# Patient Record
Sex: Female | Born: 1955 | Race: Black or African American | Hispanic: No | Smoking: Current some day smoker
Health system: Southern US, Community
[De-identification: ages and names within clinical notes are randomized; demographics above are authoritative.]

## PROBLEM LIST (undated history)

## (undated) ENCOUNTER — Emergency Department (HOSPITAL_COMMUNITY): Admission: EM | Payer: 59

## (undated) DIAGNOSIS — M549 Dorsalgia, unspecified: Secondary | ICD-10-CM

## (undated) DIAGNOSIS — M542 Cervicalgia: Secondary | ICD-10-CM

## (undated) DIAGNOSIS — I493 Ventricular premature depolarization: Secondary | ICD-10-CM

## (undated) DIAGNOSIS — T8859XA Other complications of anesthesia, initial encounter: Secondary | ICD-10-CM

## (undated) DIAGNOSIS — N189 Chronic kidney disease, unspecified: Secondary | ICD-10-CM

## (undated) DIAGNOSIS — F32A Depression, unspecified: Secondary | ICD-10-CM

## (undated) DIAGNOSIS — R112 Nausea with vomiting, unspecified: Secondary | ICD-10-CM

## (undated) DIAGNOSIS — I6529 Occlusion and stenosis of unspecified carotid artery: Secondary | ICD-10-CM

## (undated) DIAGNOSIS — I10 Essential (primary) hypertension: Secondary | ICD-10-CM

## (undated) DIAGNOSIS — M1990 Unspecified osteoarthritis, unspecified site: Secondary | ICD-10-CM

## (undated) DIAGNOSIS — K219 Gastro-esophageal reflux disease without esophagitis: Secondary | ICD-10-CM

## (undated) DIAGNOSIS — N644 Mastodynia: Secondary | ICD-10-CM

## (undated) DIAGNOSIS — E785 Hyperlipidemia, unspecified: Secondary | ICD-10-CM

## (undated) DIAGNOSIS — Z9889 Other specified postprocedural states: Secondary | ICD-10-CM

## (undated) DIAGNOSIS — M329 Systemic lupus erythematosus, unspecified: Secondary | ICD-10-CM

## (undated) HISTORY — PX: CERVICAL SPINE SURGERY: SHX589

## (undated) HISTORY — DX: Cervicalgia: M54.2

## (undated) HISTORY — DX: Essential (primary) hypertension: I10

## (undated) HISTORY — PX: APPENDECTOMY: SHX54

## (undated) HISTORY — DX: Mastodynia: N64.4

## (undated) HISTORY — DX: Ventricular premature depolarization: I49.3

## (undated) HISTORY — DX: Hyperlipidemia, unspecified: E78.5

## (undated) HISTORY — PX: ABDOMINAL HYSTERECTOMY: SHX81

## (undated) HISTORY — DX: Systemic lupus erythematosus, unspecified: M32.9

## (undated) HISTORY — DX: Dorsalgia, unspecified: M54.9

## (undated) HISTORY — DX: Chronic kidney disease, unspecified: N18.9

## (undated) HISTORY — PX: OTHER SURGICAL HISTORY: SHX169

## (undated) HISTORY — DX: Occlusion and stenosis of unspecified carotid artery: I65.29

---

## 2016-08-01 ENCOUNTER — Observation Stay (HOSPITAL_COMMUNITY): Payer: Medicare HMO

## 2016-08-01 ENCOUNTER — Encounter (HOSPITAL_COMMUNITY): Payer: Self-pay | Admitting: Family Medicine

## 2016-08-01 ENCOUNTER — Observation Stay (HOSPITAL_COMMUNITY)
Admission: AD | Admit: 2016-08-01 | Discharge: 2016-08-02 | Disposition: A | Discharge status: Home / Self Care | Payer: Medicare HMO | Source: Other Acute Inpatient Hospital | Attending: Internal Medicine | Admitting: Internal Medicine

## 2016-08-01 DIAGNOSIS — R079 Chest pain, unspecified: Secondary | ICD-10-CM | POA: Diagnosis present

## 2016-08-01 DIAGNOSIS — I10 Essential (primary) hypertension: Secondary | ICD-10-CM | POA: Diagnosis not present

## 2016-08-01 DIAGNOSIS — E049 Nontoxic goiter, unspecified: Secondary | ICD-10-CM | POA: Diagnosis not present

## 2016-08-01 DIAGNOSIS — R0789 Other chest pain: Secondary | ICD-10-CM | POA: Diagnosis not present

## 2016-08-01 DIAGNOSIS — R0689 Other abnormalities of breathing: Secondary | ICD-10-CM | POA: Insufficient documentation

## 2016-08-01 DIAGNOSIS — N289 Disorder of kidney and ureter, unspecified: Secondary | ICD-10-CM | POA: Diagnosis not present

## 2016-08-01 DIAGNOSIS — M549 Dorsalgia, unspecified: Secondary | ICD-10-CM | POA: Diagnosis not present

## 2016-08-01 DIAGNOSIS — F418 Other specified anxiety disorders: Secondary | ICD-10-CM | POA: Diagnosis present

## 2016-08-01 DIAGNOSIS — G8929 Other chronic pain: Secondary | ICD-10-CM | POA: Diagnosis not present

## 2016-08-01 DIAGNOSIS — R748 Abnormal levels of other serum enzymes: Secondary | ICD-10-CM | POA: Diagnosis present

## 2016-08-01 DIAGNOSIS — N184 Chronic kidney disease, stage 4 (severe): Secondary | ICD-10-CM

## 2016-08-01 DIAGNOSIS — Z7982 Long term (current) use of aspirin: Secondary | ICD-10-CM | POA: Insufficient documentation

## 2016-08-01 LAB — BASIC METABOLIC PANEL
ANION GAP: 9 (ref 5–15)
BUN: 24 mg/dL — AB (ref 6–20)
CO2: 23 mmol/L (ref 22–32)
Calcium: 9 mg/dL (ref 8.9–10.3)
Chloride: 112 mmol/L — ABNORMAL HIGH (ref 101–111)
Creatinine, Ser: 1.46 mg/dL — ABNORMAL HIGH (ref 0.44–1.00)
GFR calc Af Amer: 45 mL/min — ABNORMAL LOW (ref >60)
GFR, EST NON AFRICAN AMERICAN: 39 mL/min — AB (ref >60)
Glucose, Bld: 82 mg/dL (ref 65–99)
Potassium: 4 mmol/L (ref 3.5–5.1)
SODIUM: 144 mmol/L (ref 135–145)

## 2016-08-01 LAB — CBC WITH DIFFERENTIAL/PLATELET
BASOS ABS: 0.1 10*3/uL (ref 0.0–0.1)
Basophils Relative: 1 %
EOS ABS: 0.2 10*3/uL (ref 0.0–0.7)
Eosinophils Relative: 2 %
HEMATOCRIT: 36.7 % (ref 36.0–46.0)
Hemoglobin: 11.8 g/dL — ABNORMAL LOW (ref 12.0–15.0)
LYMPHS ABS: 5.3 10*3/uL — AB (ref 0.7–4.0)
LYMPHS PCT: 43 %
MCH: 21.9 pg — ABNORMAL LOW (ref 26.0–34.0)
MCHC: 32.2 g/dL (ref 30.0–36.0)
MCV: 68.1 fL — ABNORMAL LOW (ref 78.0–100.0)
MONOS PCT: 8 %
Monocytes Absolute: 1 10*3/uL (ref 0.1–1.0)
Neutro Abs: 5.7 10*3/uL (ref 1.7–7.7)
Neutrophils Relative %: 46 %
Platelets: 326 10*3/uL (ref 150–400)
RBC: 5.39 MIL/uL — ABNORMAL HIGH (ref 3.87–5.11)
RDW: 16 % — AB (ref 11.5–15.5)
WBC: 12.3 10*3/uL — AB (ref 4.0–10.5)

## 2016-08-01 LAB — TROPONIN I

## 2016-08-01 MED ORDER — BUPROPION HCL ER (SR) 100 MG PO TB12
100.0000 mg | ORAL_TABLET | ORAL | Status: DC
  Administered 2016-08-02: 100 mg via ORAL
  Filled 2016-08-01: qty 1

## 2016-08-01 MED ORDER — METOPROLOL TARTRATE 12.5 MG HALF TABLET
12.5000 mg | ORAL_TABLET | Freq: Two times a day (BID) | ORAL | Status: DC
  Administered 2016-08-02 (×2): 12.5 mg via ORAL
  Filled 2016-08-01 (×2): qty 1

## 2016-08-01 MED ORDER — ATORVASTATIN CALCIUM 40 MG PO TABS
40.0000 mg | ORAL_TABLET | Freq: Every day | ORAL | Status: DC
  Administered 2016-08-02: 40 mg via ORAL
  Filled 2016-08-01: qty 1

## 2016-08-01 MED ORDER — BREXPIPRAZOLE 1 MG PO TABS
1.0000 | ORAL_TABLET | Freq: Every day | ORAL | Status: DC
  Administered 2016-08-02: 1 mg via ORAL
  Filled 2016-08-01: qty 1

## 2016-08-01 MED ORDER — HEPARIN SODIUM (PORCINE) 5000 UNIT/ML IJ SOLN
5000.0000 [IU] | Freq: Three times a day (TID) | INTRAMUSCULAR | Status: DC
  Administered 2016-08-02 (×3): 5000 [IU] via SUBCUTANEOUS
  Filled 2016-08-01 (×3): qty 1

## 2016-08-01 MED ORDER — CYCLOBENZAPRINE HCL 10 MG PO TABS
10.0000 mg | ORAL_TABLET | Freq: Two times a day (BID) | ORAL | Status: DC
  Administered 2016-08-02 (×2): 10 mg via ORAL
  Filled 2016-08-01 (×2): qty 1

## 2016-08-01 MED ORDER — ONDANSETRON HCL 4 MG/2ML IJ SOLN: 4.0000 mg | Freq: Four times a day (QID) | INTRAMUSCULAR | Status: DC | PRN

## 2016-08-01 MED ORDER — NITROGLYCERIN 0.4 MG SL SUBL: 0.4000 mg | SUBLINGUAL_TABLET | SUBLINGUAL | Status: DC | PRN

## 2016-08-01 MED ORDER — SODIUM CHLORIDE 0.9 % IV SOLN
INTRAVENOUS | Status: DC
  Administered 2016-08-02: 01:00:00 via INTRAVENOUS

## 2016-08-01 MED ORDER — BUSPIRONE HCL 5 MG PO TABS
15.0000 mg | ORAL_TABLET | Freq: Three times a day (TID) | ORAL | Status: DC
  Administered 2016-08-02 (×2): 15 mg via ORAL
  Filled 2016-08-01 (×2): qty 1

## 2016-08-01 MED ORDER — ASPIRIN EC 81 MG PO TBEC
81.0000 mg | DELAYED_RELEASE_TABLET | Freq: Every day | ORAL | Status: DC
  Administered 2016-08-02: 81 mg via ORAL
  Filled 2016-08-01: qty 1

## 2016-08-01 MED ORDER — ACETAMINOPHEN 325 MG PO TABS: 650.0000 mg | ORAL_TABLET | ORAL | Status: DC | PRN

## 2016-08-01 MED ORDER — ADULT MULTIVITAMIN W/MINERALS CH
1.0000 | ORAL_TABLET | Freq: Every day | ORAL | Status: DC
  Administered 2016-08-02: 1 via ORAL
  Filled 2016-08-01: qty 1

## 2016-08-01 MED ORDER — ZOLPIDEM TARTRATE 5 MG PO TABS: 5.0000 mg | ORAL_TABLET | Freq: Every evening | ORAL | Status: DC | PRN

## 2016-08-01 MED ORDER — HYDROCODONE-ACETAMINOPHEN 10-325 MG PO TABS
1.0000 | ORAL_TABLET | Freq: Four times a day (QID) | ORAL | Status: DC | PRN
  Administered 2016-08-02 (×2): 1 via ORAL
  Filled 2016-08-01 (×2): qty 1

## 2016-08-01 MED ORDER — CITALOPRAM HYDROBROMIDE 20 MG PO TABS
40.0000 mg | ORAL_TABLET | Freq: Every day | ORAL | Status: DC
  Administered 2016-08-02: 40 mg via ORAL
  Filled 2016-08-01: qty 2

## 2016-08-01 MED ORDER — GABAPENTIN 300 MG PO CAPS
600.0000 mg | ORAL_CAPSULE | Freq: Three times a day (TID) | ORAL | Status: DC
  Administered 2016-08-02 (×2): 600 mg via ORAL
  Filled 2016-08-01 (×2): qty 2

## 2016-08-01 MED ORDER — ARIPIPRAZOLE 2 MG PO TABS
2.0000 mg | ORAL_TABLET | Freq: Every day | ORAL | Status: DC
  Administered 2016-08-02: 2 mg via ORAL
  Filled 2016-08-01: qty 1

## 2016-08-01 NOTE — H&P (Signed)
History and Physical    Patricia Dominguez TDD:[id number] DOB: Jul 30, 1955 DOA: 08/01/2016  PCP: No primary care provider on file.   Patient coming from: Home, [name]  Chief Complaint: Chest pain  HPI: Patricia Dominguez is a 61 y.o. female with medical history significant for depression, anxiety, chronic back and neck pain, and hypertension who presents in transfer from [facility] for evaluation of chest pain with elevated CK-MB. Patient reports that she had been in her usual state of health until approximately 2 months ago when she developed left sided chest pain. Pain has been sharp, intermittent, localized to the left chest, and with no associated dyspnea, nausea, or diaphoresis. She was evaluated by her primary care physician for this and initially pain was attributed to possible pathology in the left breast. She was sent for mammogram. Pain worsened several days ago and so she went back to her PCP who ordered cardiac enzymes from an outpatient lab. Enzymes returned elevated and a message was left on the patient's home phone instructing her to present to the emergency department immediately, but the patient was out of town and did not return until today. She received the message and went to [facility] ED for evaluation. She reports the left breast pain to still be present on an intermittent basis with no alleviating or exacerbating factors. She also reports a new right shoulder pain with radiation down to the fingers. She denies lower extremity swelling, orthopnea, or PND.   [facility] ED Course: Upon arrival to the ED, patient is found to be afebrile, saturating well on room air, hypertensive to 160/70, but with vitals otherwise stable. EKG reportedly demonstrates a normal sinus rhythm with nonspecific T-wave abnormality. Initial troponin was 0.04 (normal range 0.00-0.01), with second troponin 0.03. Patient had reportedly taken aspirin at home. She was treated with a dose of  nitroglycerin which reportedly improved her pain. She was also given 1 L of normal saline. She remained mildly hypertensive but otherwise stable, with persistence of the right shoulder pain, but resolution of the left breast pain. ED physician consulted with cardiology here at [facility] who advised a medical admission to the telemetry unit for further evaluation including serial cardiac enzymes.   [name] where she remains hemodynamically stable and in no respiratory distress. She denies left-sided chest pain, but notes a right shoulder pain with radiation to the fingers.  Review of Systems:  All other systems reviewed and apart from HPI, are negative.  History reviewed. No pertinent past medical history.  History reviewed. No pertinent surgical history.   has no tobacco, alcohol, and drug history on file.  Allergies  Allergen Reactions  . Ivp Dye [Iodinated Diagnostic Agents] Nausea And Vomiting  . Penicillins Other (See Comments)    Passed out after penicillin injection at 61 years old.   . Sulfa Antibiotics Other (See Comments)    Unknown allergic reaction    History reviewed. No pertinent family history.   Prior to Admission medications   Medication Sig Start Date End Date Taking? Authorizing Provider  amLODipine (NORVASC) 10 MG tablet Take 10 mg by mouth daily.   Yes Historical Provider, MD  ARIPiprazole (ABILIFY) 2 MG tablet Take 2 mg by mouth daily.   Yes Historical Provider, MD  Ascorbic Acid (VITAMIN C PO) Take 1 tablet by mouth daily.   Yes Historical Provider, MD  aspirin EC 81 MG tablet Take 81  mg by mouth once as needed (chest pain).   Yes Historical Provider, MD  atorvastatin (LIPITOR) 20 MG tablet Take 20 mg by mouth at bedtime.   Yes Historical Provider, MD  Brexpiprazole (REXULTI) 1 MG TABS Take 1 tablet by mouth daily.   Yes Historical Provider, MD  buPROPion (WELLBUTRIN SR) 100 MG 12 hr  tablet Take 100 mg by mouth See admin instructions. Take 1 tablet (100 mg) by mouth twice daily - 9am and 5pm   Yes Historical Provider, MD  busPIRone (BUSPAR) 15 MG tablet Take 15 mg by mouth 3 (three) times daily.   Yes Historical Provider, MD  citalopram (CELEXA) 40 MG tablet Take 40 mg by mouth daily.   Yes Historical Provider, MD  cyclobenzaprine (FLEXERIL) 10 MG tablet Take 10 mg by mouth 2 (two) times daily.   Yes Historical Provider, MD  furosemide (LASIX) 20 MG tablet Take 20 mg by mouth daily.   Yes Historical Provider, MD  gabapentin (NEURONTIN) 300 MG capsule Take 600 mg by mouth 3 (three) times daily.   Yes Historical Provider, MD  HYDROcodone-acetaminophen (NORCO) 10-325 MG tablet Take 1 tablet by mouth 3 (three) times daily as needed for severe pain.   Yes Historical Provider, MD  losartan (COZAAR) 50 MG tablet Take 50 mg by mouth daily.   Yes Historical Provider, MD  Melatonin 5 MG CAPS Take 5 mg by mouth at bedtime.   Yes Historical Provider, MD  Menthol, Topical Analgesic, (BENGAY EX) Apply 1 application topically 3 (three) times daily as needed (pain).   Yes Historical Provider, MD  Multiple Vitamin (MULTIVITAMIN WITH MINERALS) TABS tablet Take 1 tablet by mouth daily.   Yes Historical Provider, MD  Multiple Vitamins-Minerals (HAIR/SKIN/NAILS/BIOTIN) TABS Take 1 tablet by mouth daily.   Yes Historical Provider, MD  Probiotic Product (PROBIOTIC PO) Take 1 tablet by mouth daily.   Yes Historical Provider, MD    Physical Exam: Vitals:   08/01/16 2207  BP: (!) 157/72  Pulse: 72  Resp: 20  Temp: 98.4 F (36.9 C)  SpO2: 100%  Weight: 78.1 kg (172 lb 1.6 oz)  Height: 5' 7.5" (1.715 m)      Constitutional: NAD, calm, comfortable Eyes: PERTLA, lids and conjunctivae normal ENMT: Mucous membranes are moist. Posterior pharynx clear of any exudate or lesions.   Neck: normal, supple, no masses, no thyromegaly Respiratory: clear to auscultation bilaterally, no wheezing, no  crackles. Normal respiratory effort.  Cardiovascular: S1 & S2 heard, regular rate and rhythm, no significant murmur. No extremity edema. No significant JVD. Abdomen: No distension, no tenderness, no masses palpated. Bowel sounds normal.  Musculoskeletal: no clubbing / cyanosis. Pain with palpation over left chest. Normal muscle tone.  Skin: no significant rashes, lesions, ulcers. Warm, dry, well-perfused. Neurologic: CN 2-12 grossly intact. Sensation intact, DTR normal. Strength 5/5 in all 4 limbs.  Psychiatric: Normal judgment and insight. Alert and oriented x 3. Normal mood and affect.     Labs on Admission: I have personally reviewed following labs and imaging studies  CBC:  Recent Labs Lab 08/01/16 2241  WBC 12.3*  NEUTROABS 5.7  HGB 11.8*  HCT 36.7  MCV 68.1*  PLT 096   Basic Metabolic Panel:  Recent Labs Lab 08/01/16 2241  NA 144  K 4.0  CL 112*  CO2 23  GLUCOSE 82  BUN 24*  CREATININE 1.46*  CALCIUM 9.0   GFR: Estimated Creatinine Clearance: 45.7 mL/min (by C-G formula based on SCr of 1.46 mg/dL (H)). Liver  Function Tests: No results for input(s): AST, ALT, ALKPHOS, BILITOT, PROT, ALBUMIN in the last 168 hours. No results for input(s): LIPASE, AMYLASE in the last 168 hours. No results for input(s): AMMONIA in the last 168 hours. Coagulation Profile: No results for input(s): INR, PROTIME in the last 168 hours. Cardiac Enzymes:  Recent Labs Lab 08/01/16 2241  TROPONINI <0.03   BNP (last 3 results) No results for input(s): PROBNP in the last 8760 hours. HbA1C: No results for input(s): HGBA1C in the last 72 hours. CBG: No results for input(s): GLUCAP in the last 168 hours. Lipid Profile: No results for input(s): CHOL, HDL, LDLCALC, TRIG, CHOLHDL, LDLDIRECT in the last 72 hours. Thyroid Function Tests: No results for input(s): TSH, T4TOTAL, FREET4, T3FREE, THYROIDAB in the last 72 hours. Anemia Panel: No results for input(s): VITAMINB12, FOLATE,  FERRITIN, TIBC, IRON, RETICCTPCT in the last 72 hours. Urine analysis: No results found for: COLORURINE, APPEARANCEUR, LABSPEC, PHURINE, GLUCOSEU, HGBUR, BILIRUBINUR, KETONESUR, PROTEINUR, UROBILINOGEN, NITRITE, LEUKOCYTESUR Sepsis Labs: @LABRCNTIP (procalcitonin:4,lacticidven:4) )No results found for this or any previous visit (from the past 240 hour(s)).   Radiological Exams on Admission: No results found.  EKG: Independently reviewed. Normal sinus rhythm.   Assessment/Plan  1. Chest pain - Intermittent left breast pain for past couple months, worsening - CXR pending read, appears unremarkable; EKG here is NSR without acute ischemic changes  - Troponin undetectable here, but elevations in CK-MB and CK noted and downtrending from values obtain in outpatient setting (CK-MB 14.6, CK 744 on 07/30/16), and at the outside hospital prior to transfer (CK-MB 10.6, CK 595)  - Cardiology is consulting and much appreciated; plan to monitor on telemetry for ischemic changes, cycle enzymes, obtain echo  - Continue daily ASA 81, statin held in light of elevated CK, losartan held in light of kidney disease with unknown baseline, and low-dose Lopressor started given elevated BP    2. Kidney disease of uncertain chronicity  - BUN is 31 and SC2 1.42 on admission with no prior values available for comparison  - She appears roughly euvolemic on admission  - Plan to hold losartan and Lasix on admission and provide a gentle IVF hydration with NS  - Repeat chem panel in am   3. Chronic back and neck pain  - Patient complaining of pain radiating down right arm on admission, felt to be secondary to her cervical spine disease with radiculopathy; there is no weakness  - Plan to continue her home regimen of Neurontin, Norco 10 mg prn and Flexeril prn   4. Hypertension  - BP mildly elevated on presentation  - Norvasc was held in light of potential ACS and losartan held given renal insufficiency  - Start  Lopressor 12.5 mg BID for now as tolerated   5. Depression, anxiety  - Pt reports this to be stable on admission - Continue Abilify, Wellbutrin, Buspar, Celexa, and brexipiprazole    6. Goiter - Noted on exam, nontender - Pt reports previously being on medications for thyroid remotely  - Check TSH and TPO    DVT prophylaxis: sq heparin  Code Status: Full  Family Communication: Daughter updated at bedside at pt's request Disposition Plan: Observe on telemetry Consults called: Cardiology Admission status: Observation    Vianne Bulls, MD [facility] Pager [phone]  If 7PM-7AM, please contact night-coverage [email or web address] Password [facility]  08/01/2016, 11:37 PM

## 2016-08-02 ENCOUNTER — Observation Stay (HOSPITAL_BASED_OUTPATIENT_CLINIC_OR_DEPARTMENT_OTHER): Payer: Medicare HMO

## 2016-08-02 ENCOUNTER — Encounter (HOSPITAL_COMMUNITY): Payer: Self-pay | Admitting: Physician Assistant

## 2016-08-02 DIAGNOSIS — R748 Abnormal levels of other serum enzymes: Secondary | ICD-10-CM

## 2016-08-02 DIAGNOSIS — G8929 Other chronic pain: Secondary | ICD-10-CM

## 2016-08-02 DIAGNOSIS — R0789 Other chest pain: Secondary | ICD-10-CM | POA: Diagnosis not present

## 2016-08-02 DIAGNOSIS — I10 Essential (primary) hypertension: Secondary | ICD-10-CM | POA: Diagnosis not present

## 2016-08-02 DIAGNOSIS — E049 Nontoxic goiter, unspecified: Secondary | ICD-10-CM | POA: Diagnosis present

## 2016-08-02 DIAGNOSIS — R079 Chest pain, unspecified: Secondary | ICD-10-CM

## 2016-08-02 DIAGNOSIS — M549 Dorsalgia, unspecified: Secondary | ICD-10-CM | POA: Diagnosis not present

## 2016-08-02 DIAGNOSIS — F418 Other specified anxiety disorders: Secondary | ICD-10-CM

## 2016-08-02 DIAGNOSIS — R0782 Intercostal pain: Secondary | ICD-10-CM

## 2016-08-02 LAB — BRAIN NATRIURETIC PEPTIDE: B Natriuretic Peptide: 41.4 pg/mL (ref 0.0–100.0)

## 2016-08-02 LAB — BASIC METABOLIC PANEL
ANION GAP: 10 (ref 5–15)
BUN: 24 mg/dL — ABNORMAL HIGH (ref 6–20)
CHLORIDE: 109 mmol/L (ref 101–111)
CO2: 24 mmol/L (ref 22–32)
CREATININE: 1.39 mg/dL — AB (ref 0.44–1.00)
Calcium: 8.8 mg/dL — ABNORMAL LOW (ref 8.9–10.3)
GFR calc non Af Amer: 41 mL/min — ABNORMAL LOW (ref >60)
GFR, EST AFRICAN AMERICAN: 47 mL/min — AB (ref >60)
Glucose, Bld: 111 mg/dL — ABNORMAL HIGH (ref 65–99)
Potassium: 4.1 mmol/L (ref 3.5–5.1)
Sodium: 143 mmol/L (ref 135–145)

## 2016-08-02 LAB — HEPATIC FUNCTION PANEL
ALT: 21 U/L (ref 14–54)
AST: 21 U/L (ref 15–41)
Albumin: 3.6 g/dL (ref 3.5–5.0)
Alkaline Phosphatase: 70 U/L (ref 38–126)
BILIRUBIN TOTAL: 0.2 mg/dL — AB (ref 0.3–1.2)
Total Protein: 6.6 g/dL (ref 6.5–8.1)

## 2016-08-02 LAB — ECHOCARDIOGRAM COMPLETE
Height: 67.5 in
WEIGHTICAEL: 2752 [oz_av]

## 2016-08-02 LAB — CK TOTAL AND CKMB (NOT AT ARMC)
CK, MB: 9.4 ng/mL — ABNORMAL HIGH (ref 0.5–5.0)
RELATIVE INDEX: 1.8 (ref 0.0–2.5)
Total CK: 526 U/L — ABNORMAL HIGH (ref 38–234)

## 2016-08-02 LAB — T4, FREE: Free T4: 1.01 ng/dL (ref 0.61–1.12)

## 2016-08-02 LAB — TSH: TSH: 1.215 u[IU]/mL (ref 0.350–4.500)

## 2016-08-02 LAB — TROPONIN I

## 2016-08-02 LAB — SEDIMENTATION RATE: SED RATE: 27 mm/h — AB (ref 0–22)

## 2016-08-02 LAB — HIV ANTIBODY (ROUTINE TESTING W REFLEX): HIV SCREEN 4TH GENERATION: NONREACTIVE

## 2016-08-02 MED ORDER — ASPIRIN 81 MG PO TBEC: 81.0000 mg | DELAYED_RELEASE_TABLET | Freq: Every day | ORAL | Status: DC

## 2016-08-02 MED ORDER — FUROSEMIDE 20 MG PO TABS
20.0000 mg | ORAL_TABLET | Freq: Every day | ORAL | Status: DC
  Administered 2016-08-02: 20 mg via ORAL
  Filled 2016-08-02: qty 1

## 2016-08-02 MED ORDER — LABETALOL HCL 5 MG/ML IV SOLN: 5.0000 mg | INTRAVENOUS | Status: DC | PRN

## 2016-08-02 MED ORDER — LOSARTAN POTASSIUM 50 MG PO TABS
50.0000 mg | ORAL_TABLET | Freq: Every day | ORAL | Status: DC
  Administered 2016-08-02: 50 mg via ORAL
  Filled 2016-08-02: qty 1

## 2016-08-02 NOTE — Progress Notes (Signed)
  Echocardiogram 2D Echocardiogram has been performed.  Patricia Dominguez 08/02/2016, 12:55 PM

## 2016-08-02 NOTE — Consult Note (Signed)
Admit date: 08/01/2016 Referring Physician  Dr. Myna Hidalgo Primary Cardiologist  none Reason for Consultation  Atypical CP and elevated CPK  HPI: This is a 61yo AAF with a history of HTN and chronic neck and back pain who has been having episodes of left breast pain for over 2 months.  This comes and goes, worse with movement and sometimes with inspiration but also can wake her up at night.  It starts out sharp and then becomes a squeezing sensation over her left breast.  She initially thought it was related to her breast as she has been getting mammograms every 3 months.  The discomfort became worse and she went to see her PCP who did blood work then the patient went out of town.  When she got home she had a message on the phone telling her to go to the ER due to abnormal blood work and she went to [facility].  There a repeat trop was 0.03 and 0.04 (their cutoff being 0.01).  Her CPK apparently had been 700 with an MB of reportedly 14 by her PCP and in ER at [facility] a repeat CPK was 545 with MB 12 and RI of 1.7.  She is now transferred to [facility] for further evaluation on the [facility] service.  She denies any recent URI symptoms, palpitations but has had some mild LE edema.  She denies any SOB.  She denies any exertional CP.  Cardiology is now asked to consult.       PMH:  History reviewed. No pertinent past medical history.   PSH:  History reviewed. No pertinent surgical history.  Allergies:  Ivp dye [iodinated diagnostic agents]; Penicillins; and Sulfa antibiotics Prior to Admit Meds:   Prescriptions Prior to Admission  Medication Sig Dispense Refill Last Dose  . amLODipine (NORVASC) 10 MG tablet Take 10 mg by mouth daily.   08/01/2016 at am  . ARIPiprazole (ABILIFY) 2 MG tablet Take 2 mg by mouth daily.   08/01/2016 at am  . Ascorbic Acid (VITAMIN C PO) Take 1 tablet by mouth daily.   08/01/2016 at Unknown time  . aspirin EC 81 MG tablet Take 81 mg by mouth once as needed (chest pain).   08/01/2016  at 900  . atorvastatin (LIPITOR) 20 MG tablet Take 20 mg by mouth at bedtime.   07/31/2016 at pm  . Brexpiprazole (REXULTI) 1 MG TABS Take 1 tablet by mouth daily.   08/01/2016 at am  . buPROPion (WELLBUTRIN SR) 100 MG 12 hr tablet Take 100 mg by mouth See admin instructions. Take 1 tablet (100 mg) by mouth twice daily - 9am and 5pm   08/01/2016 at 900  . busPIRone (BUSPAR) 15 MG tablet Take 15 mg by mouth 3 (three) times daily.   08/01/2016 at 1300  . citalopram (CELEXA) 40 MG tablet Take 40 mg by mouth daily.   08/01/2016 at am  . cyclobenzaprine (FLEXERIL) 10 MG tablet Take 10 mg by mouth 2 (two) times daily.   08/01/2016 at am  . furosemide (LASIX) 20 MG tablet Take 20 mg by mouth daily.   08/01/2016 at am  . gabapentin (NEURONTIN) 300 MG capsule Take 600 mg by mouth 3 (three) times daily.   08/01/2016 at 1300  . HYDROcodone-acetaminophen (NORCO) 10-325 MG tablet Take 1 tablet by mouth 3 (three) times daily as needed for severe pain.   08/01/2016 at 1000  . losartan (COZAAR) 50 MG tablet Take 50 mg by mouth daily.   08/01/2016  at 900  . Melatonin 5 MG CAPS Take 5 mg by mouth at bedtime.   07/31/2016 at Unknown time  . Menthol, Topical Analgesic, (BENGAY EX) Apply 1 application topically 3 (three) times daily as needed (pain).   07/31/2016 at pm  . Multiple Vitamin (MULTIVITAMIN WITH MINERALS) TABS tablet Take 1 tablet by mouth daily.   08/01/2016 at Unknown time  . Multiple Vitamins-Minerals (HAIR/SKIN/NAILS/BIOTIN) TABS Take 1 tablet by mouth daily.   08/01/2016 at Unknown time  . Probiotic Product (PROBIOTIC PO) Take 1 tablet by mouth daily.   08/01/2016 at Unknown time   Fam HX:   History reviewed. No pertinent family history. Social HX:    Social History   Social History  . Marital status: N/A    Spouse name: N/A  . Number of children: N/A  . Years of education: N/A   Occupational History  . Not on file.   Social History Main Topics  . Smoking status: Not on file  . Smokeless tobacco: Not on  file  . Alcohol use Not on file  . Drug use: Unknown  . Sexual activity: Not on file   Other Topics Concern  . Not on file   Social History Narrative  . No narrative on file     ROS:  All 11 ROS were addressed and are negative except what is stated in the HPI  Physical Exam: Blood pressure (!) 157/72, pulse 72, temperature 98.4 F (36.9 C), resp. rate 20, height 5' 7.5" (1.715 m), weight 172 lb 1.6 oz (78.1 kg), SpO2 100 %.    General: Well developed, well nourished, in no acute distress Head: Eyes PERRLA, No xanthomas.   Normal cephalic and atramatic  Lungs:   Clear bilaterally to auscultation and percussion. Heart:   HRRR S1 S2 Pulses are 2+ & equal.            No carotid bruit. No JVD.  No abdominal bruits. No femoral bruits.  She is exquisitely tender to palpation over left chest wall which recreates her CP. Abdomen: Bowel sounds are positive, abdomen soft and non-tender without masses  Msk:  Back normal, normal gait. Normal strength and tone for age. Extremities:   No clubbing, cyanosis or edema.  DP +1 Neuro: Alert and oriented X 3. Psych:  Good affect, responds appropriately    Labs:   Lab Results  Component Value Date   WBC 12.3 (H) 08/01/2016   HGB 11.8 (L) 08/01/2016   HCT 36.7 08/01/2016   MCV 68.1 (L) 08/01/2016   PLT 326 08/01/2016    Recent Labs Lab 08/01/16 2241  NA 144  K 4.0  CL 112*  CO2 23  BUN 24*  CREATININE 1.46*  CALCIUM 9.0  GLUCOSE 82   No results found for: PTT No results found for: INR, PROTIME Lab Results  Component Value Date   TROPONINI <0.03 08/01/2016    No results found for: CHOL No results found for: HDL No results found for: LDLCALC No results found for: TRIG No results found for: CHOLHDL No results found for: LDLDIRECT    Radiology:  No results found.  EKG:  NSR with no ST changes.  ASSESSMENT/PLAN:   1.  Atypical chest pain that is worse with movement and she is very tender to palpation over her left chest  wall which recreates the symptoms she has been having.   Her trop was minimally elevated with flat trend in setting of mild renal insuff at [facility] and is normal  here.  Her CPK is way out of proportion to the degree of MB and normal relative index making acute coronary syndrome less likely.  She does have CRF including remote tobacco use, family history of CAD and MI at an early age in her father and HTN.  EKG is normal.   Recommend continuing to cycle enzymes.  Keep NPO after MN.  Further cardiac workup pending results of enzymes.  Will get a 2D echo in am to assess LVF and wall motion.   2.  HTN - BP controlled.  Continue amlodipine/ARB  3.  Elevated CPK out of proportion to MB with normal relative index.  Would look for other causes of elevated CPK. She is on a statin and would hold that for now  Consider checking a sed rate and CRP.  4.  Hyperlipidemia on Lipitor at home.  Hold for now due to elevated CPK.  Fransico Him, MD  08/02/2016  12:03 AM

## 2016-08-02 NOTE — Progress Notes (Signed)
Progress Note  Patient Name: Patricia Dominguez Date of Encounter: 08/02/2016  Primary Cardiologist: Dr Radford Pax, new this admit  Subjective   Chest wall is very tender, has some back pain, no SOB. Relieved to learn no MI.  Inpatient Medications    Scheduled Meds: . ARIPiprazole  2 mg Oral Daily  . aspirin EC  81 mg Oral Daily  . Brexpiprazole  1 tablet Oral Daily  . buPROPion  100 mg Oral 2 times per day  . busPIRone  15 mg Oral TID  . citalopram  40 mg Oral Daily  . cyclobenzaprine  10 mg Oral BID  . gabapentin  600 mg Oral TID  . heparin  5,000 Units Subcutaneous Q8H  . metoprolol tartrate  12.5 mg Oral BID  . multivitamin with minerals  1 tablet Oral Daily   Continuous Infusions: . sodium chloride 75 mL/hr at 08/02/16 0039   PRN Meds: acetaminophen, HYDROcodone-acetaminophen, labetalol, nitroGLYCERIN, ondansetron (ZOFRAN) IV, zolpidem   Vital Signs    Vitals:   08/01/16 2207 08/02/16 0035 08/02/16 0451  BP: (!) 157/72  (!) 133/51  Pulse: 72 69 (!) 56  Resp: 20  18  Temp: 98.4 F (36.9 C)  98.7 F (37.1 C)  TempSrc: Oral    SpO2: 100%  96%  Weight: 172 lb 1.6 oz (78.1 kg)  172 lb (78 kg)  Height: 5' 7.5" (1.715 m)      Intake/Output Summary (Last 24 hours) at 08/02/16 0800 Last data filed at 08/02/16 0506  Gross per 24 hour  Intake           693.75 ml  Output                0 ml  Net           693.75 ml   Filed Weights   08/01/16 2207 08/02/16 0451  Weight: 172 lb 1.6 oz (78.1 kg) 172 lb (78 kg)    Telemetry    SR, Sinus brady to high 40s while asleep - Personally Reviewed  ECG    n/a - Personally Reviewed  Physical Exam   General: Well developed, well nourished, female appearing in no acute distress. Head: Normocephalic, atraumatic.  Neck: Supple without bruits, JVD not elevated. Thyroid is enlarged Lungs:  Resp regular and unlabored, CTA. Heart: RRR, S1, S2, no S3, S4, soft murmur; no rub. Abdomen: Soft, non-tender, non-distended with  normoactive bowel sounds. No hepatomegaly. No rebound/guarding. No obvious abdominal masses. Extremities: No clubbing, cyanosis, no edema. Distal pedal pulses are 2+ bilaterally. Neuro: Alert and oriented X 3. Moves all extremities spontaneously. Psych: Normal affect.  Labs    Chemistry Recent Labs Lab 08/01/16 2241 08/02/16 0433  NA 144 143  K 4.0 4.1  CL 112* 109  CO2 23 24  GLUCOSE 82 111*  BUN 24* 24*  CREATININE 1.46* 1.39*  CALCIUM 9.0 8.8*  PROT  --  6.6  ALBUMIN  --  3.6  AST  --  21  ALT  --  21  ALKPHOS  --  70  BILITOT  --  0.2*  GFRNONAA 39* 41*  GFRAA 45* 47*  ANIONGAP 9 10     Hematology Recent Labs Lab 08/01/16 2241  WBC 12.3*  RBC 5.39*  HGB 11.8*  HCT 36.7  MCV 68.1*  MCH 21.9*  MCHC 32.2  RDW 16.0*  PLT 326   Lab Results  Component Value Date   TSH 1.215 08/02/2016   Cardiac Enzymes Recent Labs Lab  08/01/16 2241 08/02/16 0433  TROPONINI <0.03 <0.03    Lab Results  Component Value Date   CKTOTAL 526 (H) 08/01/2016   CKMB 9.4 (H) 08/01/2016   TROPONINI <0.03 08/02/2016   BNP Recent Labs Lab 08/01/16 2241  BNP 41.4    Erythrocyte Sedimentation Rate     Component Value Date/Time   ESRSEDRATE 27 (H) 08/02/2016 0433   No results found for: CRP  Radiology    Dg Chest Port 1 View Result Date: 08/02/2016 CLINICAL DATA:  Initial evaluation for acute chest pain. EXAM: PORTABLE CHEST 1 VIEW COMPARISON:  None available. FINDINGS: Cardiac and mediastinal silhouettes are within normal limits. Lungs are mildly hypoinflated. No focal infiltrate, pulmonary edema, or pleural effusion. No pneumothorax. No acute osseus abnormality.  Cervical ACDF partially visualized. IMPRESSION: Shallow lung inflation with no active cardiopulmonary disease identified. Electronically Signed   By: Jeannine Boga M.D.   On: 08/02/2016 01:54    Cardiac Studies   Echo ordered  Patient Profile     61 y.o. female with hx HTN, HLD, FH CAD, tob use,  depression/anxiety, neck/back pain was admitted from [facility] ER with elevated CKMB (normal index), minimal troponin elevation, mild RI, and chest pain x 2 months (worse w/ movement, +chest wall tenderness). Statin on hold, echo ordered.  Assessment & Plan    Principal Problem: 1.  Chest pain - sx are atypical and ez neg MI. - Echo ordered, f/u on results - pt is NPO, MD advise on Lexiscan MV today - with RI, would avoid dye studies, but could get calcium score  2.  Sinus bradycardia - asymptomtatic - HR 50s before metoprolol given - decreased to high 40s while sleeping, asymptomatic - follow on telemetry - HR 50s is ok.  Otherwise, per IM Active Problems:   Chronic back pain   Depression with anxiety   Accelerated hypertension   Kidney disease   Cardiac enzymes elevated - at [facility] and here, index normal   Goiter - nl TSH, other labs pending    Signed, Rosaria Ferries , PA-C 8:00 AM 08/02/2016 Pager: [phone]  Pt seen and examined  I agree with findings as noted dby R Barrett On exam:  Lungs CTA  Cardiac RRR  N oS3  Chest and back Tender to palpitation L mid chestExt without edema   CP is atypical  Worse with palpation of back and chest   I would not plan stress  If Echo OK then d/c Agree with stopping statin and follow as outpt    CK is mildly increased  Will stop b blocker  Rsume losartan and lasix (on as outpt).  Dorris Carnes

## 2016-08-02 NOTE — Discharge Summary (Signed)
Physician Discharge Summary  Patricia Dominguez FWY:[id number] DOB: July 27, 1955 DOA: 08/01/2016  PCP: No primary care provider on file.  Admit date: 08/01/2016 Discharge date: 08/02/2016   Recommendations for Outpatient Follow-Up:   1. Holding statin for increased CK- follow up as outpatient 2. BMP 1 week re: Cr   Discharge Diagnosis:   Principal Problem:   Chest pain Active Problems:   Chronic back pain   Depression with anxiety   Accelerated hypertension   Kidney disease   Cardiac enzymes elevated   Goiter   Discharge disposition:  Home.   Discharge Condition: Improved.  Diet recommendation: Low sodium, heart healthy  Wound care: None.   History of Present Illness:   Patricia Dominguez is a 61 y.o. female with medical history significant for depression, anxiety, chronic back and neck pain, and hypertension who presents in transfer from [facility] for evaluation of chest pain with elevated CK-MB. Patient reports that she had been in her usual state of health until approximately 2 months ago when she developed left sided chest pain. Pain has been sharp, intermittent, localized to the left chest, and with no associated dyspnea, nausea, or diaphoresis. She was evaluated by her primary care physician for this and initially pain was attributed to possible pathology in the left breast. She was sent for mammogram. Pain worsened several days ago and so she went back to her PCP who ordered cardiac enzymes from an outpatient lab. Enzymes returned elevated and a message was left on the patient's home phone instructing her to present to the emergency department immediately, but the patient was out of town and did not return until today. She received the message and went to [facility] ED for evaluation. She reports the left breast pain to still be present on an intermittent basis with no alleviating or exacerbating factors. She also reports a new right shoulder pain with radiation down to  the fingers. She denies lower extremity swelling, orthopnea, or PND.    Hospital Course by Problem:   CP is atypical   -pain worse with palpation of back and chest   -cardiology does not reccomend stress -echo: Left ventricle: The cavity size was normal. Wall thickness was   normal. Systolic function was normal. The estimated ejection   fraction was in the range of 60% to 65%. Wall motion was normal;   there were no regional wall motion abnormalities. Left   ventricular diastolic function parameters were normal. - Atrial septum: No defect or patent foramen ovale was identified. -stop statin and follow as outpt    CK is mildly increased   -Resume losartan and lasix    Medical Consultants:    cards   Discharge Exam:   Vitals:   08/02/16 0451 08/02/16 1414  BP: (!) 133/51 (!) 141/55  Pulse: (!) 56 61  Resp: 18 19  Temp: 98.7 F (37.1 C) 98.7 F (37.1 C)   Vitals:   08/01/16 2207 08/02/16 0035 08/02/16 0451 08/02/16 1414  BP: (!) 157/72  (!) 133/51 (!) 141/55  Pulse: 72 69 (!) 56 61  Resp: 20  18 19   Temp: 98.4 F (36.9 C)  98.7 F (37.1 C) 98.7 F (37.1 C)  TempSrc: Oral   Oral  SpO2: 100%  96%   Weight: 78.1 kg (172 lb 1.6 oz)  78 kg (172 lb)   Height: 5' 7.5" (1.715 m)       Gen:  NAD    The results of significant diagnostics from this hospitalization (  including imaging, microbiology, ancillary and laboratory) are listed below for reference.     Procedures and Diagnostic Studies:   Dg Chest Port 1 View  Result Date: 08/02/2016 CLINICAL DATA:  Initial evaluation for acute chest pain. EXAM: PORTABLE CHEST 1 VIEW COMPARISON:  None available. FINDINGS: Cardiac and mediastinal silhouettes are within normal limits. Lungs are mildly hypoinflated. No focal infiltrate, pulmonary edema, or pleural effusion. No pneumothorax. No acute osseus abnormality.  Cervical ACDF partially visualized. IMPRESSION: Shallow lung inflation with no active cardiopulmonary disease  identified. Electronically Signed   By: Jeannine Boga M.D.   On: 08/02/2016 01:54     Labs:   Basic Metabolic Panel:  Recent Labs Lab 08/01/16 2241 08/02/16 0433  NA 144 143  K 4.0 4.1  CL 112* 109  CO2 23 24  GLUCOSE 82 111*  BUN 24* 24*  CREATININE 1.46* 1.39*  CALCIUM 9.0 8.8*   GFR Estimated Creatinine Clearance: 48 mL/min (by C-G formula based on SCr of 1.39 mg/dL (H)). Liver Function Tests:  Recent Labs Lab 08/02/16 0433  AST 21  ALT 21  ALKPHOS 70  BILITOT 0.2*  PROT 6.6  ALBUMIN 3.6   No results for input(s): LIPASE, AMYLASE in the last 168 hours. No results for input(s): AMMONIA in the last 168 hours. Coagulation profile No results for input(s): INR, PROTIME in the last 168 hours.  CBC:  Recent Labs Lab 08/01/16 2241  WBC 12.3*  NEUTROABS 5.7  HGB 11.8*  HCT 36.7  MCV 68.1*  PLT 326   Cardiac Enzymes:  Recent Labs Lab 08/01/16 2241 08/02/16 0433 08/02/16 1015  CKTOTAL 526*  --   --   CKMB 9.4*  --   --   TROPONINI <0.03 <0.03 <0.03   BNP: Invalid input(s): POCBNP CBG: No results for input(s): GLUCAP in the last 168 hours. D-Dimer No results for input(s): DDIMER in the last 72 hours. Hgb A1c No results for input(s): HGBA1C in the last 72 hours. Lipid Profile No results for input(s): CHOL, HDL, LDLCALC, TRIG, CHOLHDL, LDLDIRECT in the last 72 hours. Thyroid function studies  Recent Labs  08/02/16 0433  TSH 1.215   Anemia work up No results for input(s): VITAMINB12, FOLATE, FERRITIN, TIBC, IRON, RETICCTPCT in the last 72 hours. Microbiology No results found for this or any previous visit (from the past 240 hour(s)).   Discharge Instructions:   Discharge Instructions    Diet - low sodium heart healthy    Complete by:  As directed    Discharge instructions    Complete by:  As directed    Stop lipitor   Increase activity slowly    Complete by:  As directed      Allergies as of 08/02/2016      Reactions   Ivp  Dye [iodinated Diagnostic Agents] Nausea And Vomiting   Penicillins Other (See Comments)   Passed out after penicillin injection at 61 years old.    Sulfa Antibiotics Other (See Comments)   Unknown allergic reaction      Medication List    STOP taking these medications   amLODipine 10 MG tablet Commonly known as:  NORVASC   atorvastatin 20 MG tablet Commonly known as:  LIPITOR     TAKE these medications   ARIPiprazole 2 MG tablet Commonly known as:  ABILIFY Take 2 mg by mouth daily.   aspirin 81 MG EC tablet Take 1 tablet (81 mg total) by mouth daily. Start taking on:  08/03/2016 What changed:  when  to take this  reasons to take this   BENGAY EX Apply 1 application topically 3 (three) times daily as needed (pain).   buPROPion 100 MG 12 hr tablet Commonly known as:  WELLBUTRIN SR Take 100 mg by mouth See admin instructions. Take 1 tablet (100 mg) by mouth twice daily - 9am and 5pm   busPIRone 15 MG tablet Commonly known as:  BUSPAR Take 15 mg by mouth 3 (three) times daily.   citalopram 40 MG tablet Commonly known as:  CELEXA Take 40 mg by mouth daily.   cyclobenzaprine 10 MG tablet Commonly known as:  FLEXERIL Take 10 mg by mouth 2 (two) times daily.   furosemide 20 MG tablet Commonly known as:  LASIX Take 20 mg by mouth daily.   gabapentin 300 MG capsule Commonly known as:  NEURONTIN Take 600 mg by mouth 3 (three) times daily.   HAIR/SKIN/NAILS/BIOTIN Tabs Take 1 tablet by mouth daily.   HYDROcodone-acetaminophen 10-325 MG tablet Commonly known as:  NORCO Take 1 tablet by mouth 3 (three) times daily as needed for severe pain.   losartan 50 MG tablet Commonly known as:  COZAAR Take 50 mg by mouth daily.   Melatonin 5 MG Caps Take 5 mg by mouth at bedtime.   multivitamin with minerals Tabs tablet Take 1 tablet by mouth daily.   PROBIOTIC PO Take 1 tablet by mouth daily.   REXULTI 1 MG Tabs Generic drug:  Brexpiprazole Take 1 tablet by  mouth daily.   VITAMIN C PO Take 1 tablet by mouth daily.      Follow-up Information    PCP 1 week Follow up.            Time coordinating discharge: 35 min  Signed:  Kya Mayfield U Imran Nuon   [facility] 08/02/2016, 2:23 PM

## 2016-08-03 LAB — HIGH SENSITIVITY CRP: CRP, High Sensitivity: 3.55 mg/L — ABNORMAL HIGH (ref 0.00–3.00)

## 2016-09-02 ENCOUNTER — Ambulatory Visit: Payer: Medicare HMO | Admitting: Physician Assistant

## 2016-09-21 ENCOUNTER — Ambulatory Visit: Payer: Medicare HMO | Admitting: Physician Assistant

## 2016-10-07 ENCOUNTER — Ambulatory Visit: Payer: Medicare HMO | Admitting: Physician Assistant

## 2016-10-18 ENCOUNTER — Ambulatory Visit: Payer: Medicare HMO | Admitting: Physician Assistant

## 2016-10-18 NOTE — Progress Notes (Deleted)
Cardiology Office Note    Date:  10/18/2016   ID:  Patricia Dominguez, DOB [birth year], MRN [id number]  PCP:  No primary care provider on file.  Cardiologist: Dr. Radford Pax  No chief complaint on file.   History of Present Illness:  Patricia Dominguez is a 61 y.o. female who was seen by Dr. Radford Pax in the hospital 08/02/16 as a consult. She has history of hypertension and chronic neck and back pain, HLD, anxiety depression, complaining of left breast pain for 2 months. She was told to go to the emergency room and went to [facility] where repeat troponins were 0.03 and 0.04 in the setting of mild renal insufficiency. CPKs apparently had been in the 700s with MB of 14, repeat 545/12. She does have family history of CAD with her father having an MI at an early age, remote cigarette abuse and hypertension. Her EKG was normal. Lipitor was stopped because of elevated CPKs. 2-D echo 08/02/16 showed normal LVEF 60-65% beta blocker was stopped because of bradycardia and CPKs were to be followed as an outpatient.    Past Medical History:  Diagnosis Date  . Back pain   . Breast pain   . Hyperlipidemia   . Hypertension   . Neck pain     No past surgical history on file.  Current Medications: Outpatient Medications Prior to Visit  Medication Sig Dispense Refill  . ARIPiprazole (ABILIFY) 2 MG tablet Take 2 mg by mouth daily.    . Ascorbic Acid (VITAMIN C PO) Take 1 tablet by mouth daily.    Marland Kitchen aspirin EC 81 MG EC tablet Take 1 tablet (81 mg total) by mouth daily.    . Brexpiprazole (REXULTI) 1 MG TABS Take 1 tablet by mouth daily.    Marland Kitchen buPROPion (WELLBUTRIN SR) 100 MG 12 hr tablet Take 100 mg by mouth See admin instructions. Take 1 tablet (100 mg) by mouth twice daily - 9am and 5pm    . busPIRone (BUSPAR) 15 MG tablet Take 15 mg by mouth 3 (three) times daily.    . citalopram (CELEXA) 40 MG tablet Take 40 mg by mouth daily.    . cyclobenzaprine (FLEXERIL) 10 MG tablet Take 10 mg by mouth 2 (two) times  daily.    . furosemide (LASIX) 20 MG tablet Take 20 mg by mouth daily.    Marland Kitchen gabapentin (NEURONTIN) 300 MG capsule Take 600 mg by mouth 3 (three) times daily.    Marland Kitchen HYDROcodone-acetaminophen (NORCO) 10-325 MG tablet Take 1 tablet by mouth 3 (three) times daily as needed for severe pain.    Marland Kitchen losartan (COZAAR) 50 MG tablet Take 50 mg by mouth daily.    . Melatonin 5 MG CAPS Take 5 mg by mouth at bedtime.    . Menthol, Topical Analgesic, (BENGAY EX) Apply 1 application topically 3 (three) times daily as needed (pain).    . Multiple Vitamin (MULTIVITAMIN WITH MINERALS) TABS tablet Take 1 tablet by mouth daily.    . Multiple Vitamins-Minerals (HAIR/SKIN/NAILS/BIOTIN) TABS Take 1 tablet by mouth daily.    . Probiotic Product (PROBIOTIC PO) Take 1 tablet by mouth daily.     No facility-administered medications prior to visit.      Allergies:   Ivp dye [iodinated diagnostic agents]; Penicillins; and Sulfa antibiotics   Social History   Social History  . Marital status: N/A    Spouse name: N/A  . Number of children: N/A  . Years of education: N/A   Social History Main  Topics  . Smoking status: Not on file  . Smokeless tobacco: Not on file  . Alcohol use Not on file  . Drug use: Unknown  . Sexual activity: Not on file   Other Topics Concern  . Not on file   Social History Narrative  . No narrative on file     Family History:  The patient's   family history includes Heart attack (age of onset: 58) in her sister; Heart attack (age of onset: 28) in her father; Kidney failure in her mother.   ROS:   Please see the history of present illness.    Review of Systems  Constitution: Negative.  HENT: Negative.   Eyes: Negative.   Cardiovascular: Negative.   Respiratory: Negative.   Hematologic/Lymphatic: Negative.   Musculoskeletal: Negative.  Negative for joint pain.  Gastrointestinal: Negative.   Genitourinary: Negative.   Neurological: Negative.    All other systems reviewed and are  negative.   PHYSICAL EXAM:   VS:  There were no vitals taken for this visit.  Physical Exam  GEN: Well nourished, well developed, in no acute distress  HEENT: normal  Neck: no JVD, carotid bruits, or masses Cardiac:RRR; no murmurs, rubs, or gallops  Respiratory:  clear to auscultation bilaterally, normal work of breathing GI: soft, nontender, nondistended, + BS Ext: without cyanosis, clubbing, or edema, Good distal pulses bilaterally MS: no deformity or atrophy  Skin: warm and dry, no rash Neuro:  Alert and Oriented x 3, Strength and sensation are intact Psych: euthymic mood, full affect  Wt Readings from Last 3 Encounters:  08/02/16 172 lb (78 kg)      Studies/Labs Reviewed:   EKG:  EKG is*** ordered today.  The ekg ordered today demonstrates ***  Recent Labs: 08/01/2016: B Natriuretic Peptide 41.4; Hemoglobin 11.8; Platelets 326 08/02/2016: ALT 21; BUN 24; Creatinine, Ser 1.39; Potassium 4.1; Sodium 143; TSH 1.215   Lipid Panel No results found for: CHOL, TRIG, HDL, CHOLHDL, VLDL, LDLCALC, LDLDIRECT  Additional studies/ records that were reviewed today include:  ***    ASSESSMENT:    No diagnosis found.   PLAN:  In order of problems listed above:      Medication Adjustments/Labs and Tests Ordered: Current medicines are reviewed at length with the patient today.  Concerns regarding medicines are outlined above.  Medication changes, Labs and Tests ordered today are listed in the Patient Instructions below. There are no Patient Instructions on file for this visit.   Sumner Boast, PA-C  10/18/2016 10:30 AM    [facility] [address], [place], [place]  [address] Phone: [phone]; Fax: [phone]

## 2016-10-21 ENCOUNTER — Encounter: Payer: Self-pay | Admitting: Physician Assistant

## 2016-10-27 ENCOUNTER — Encounter: Payer: Self-pay | Admitting: Physician Assistant

## 2016-12-01 NOTE — Progress Notes (Signed)
Cardiology Office Note    Date:  12/02/2016   ID:  Patricia Dominguez, DOB 27-Apr-1956, MRN [id number]  PCP:  Patient, No Pcp Per  Cardiologist: Dr. Radford Pax  Chief Complaint  Patient presents with  . Follow-up    Post hospital/chest pain    History of Present Illness:  Patricia Dominguez is a 61 y.o. female seen by Dr. Radford Pax in the hospital in 07/2016 with atypical chest pain tender to palpation over her left chest wall. Her troponins were minimally elevated with a flat trend in the setting of mild renal insufficiency at [facility] and then normalized. Her CPKs were way out of proportion to the degree of MB and she had a normal relative index. CRF include remote tobacco use, family history of CAD and MI at an early age in her father and hypertension. EKG was normal. 2-D echo 07/2016 normal LVEF 60-65% with no wall motion abnormality. No stress test recommended. Statin was stopped and beta blocker was stopped because of bradycardia.  Patient didn't come in for post hospital follow-up because she couldn't afford it. She now can afford her co-pay so maybe appointment for follow-up. She says her blood pressure has been running high. She has occasional chest pain but not like it had been in the past. She is asking for referral for primary care and to the pain clinic for her chronic pain. She is also having a lot of trouble with her granddaughter who wrote a suicide note. She smokes occasionally when she stressed which is a couple times a month.    Past Medical History:  Diagnosis Date  . Back pain   . Breast pain   . Hyperlipidemia   . Hypertension   . Neck pain     No past surgical history on file.  Current Medications: Outpatient Medications Prior to Visit  Medication Sig Dispense Refill  . ARIPiprazole (ABILIFY) 2 MG tablet Take 2 mg by mouth daily.    . Ascorbic Acid (VITAMIN C PO) Take 1 tablet by mouth daily.    Marland Kitchen aspirin EC 81 MG EC tablet Take 1 tablet (81 mg total) by mouth  daily.    . Brexpiprazole (REXULTI) 1 MG TABS Take 1 tablet by mouth daily.    Marland Kitchen buPROPion (WELLBUTRIN SR) 100 MG 12 hr tablet Take 100 mg by mouth See admin instructions. Take 1 tablet (100 mg) by mouth twice daily - 9am and 5pm    . busPIRone (BUSPAR) 15 MG tablet Take 15 mg by mouth 3 (three) times daily.    . citalopram (CELEXA) 40 MG tablet Take 40 mg by mouth daily.    . cyclobenzaprine (FLEXERIL) 10 MG tablet Take 10 mg by mouth 2 (two) times daily.    . furosemide (LASIX) 20 MG tablet Take 20 mg by mouth daily.    Marland Kitchen gabapentin (NEURONTIN) 300 MG capsule Take 600 mg by mouth 3 (three) times daily.    Marland Kitchen HYDROcodone-acetaminophen (NORCO) 10-325 MG tablet Take 1 tablet by mouth 3 (three) times daily as needed for severe pain.    . Melatonin 5 MG CAPS Take 5 mg by mouth at bedtime.    . Menthol, Topical Analgesic, (BENGAY EX) Apply 1 application topically 3 (three) times daily as needed (pain).    . Multiple Vitamin (MULTIVITAMIN WITH MINERALS) TABS tablet Take 1 tablet by mouth daily.    . Multiple Vitamins-Minerals (HAIR/SKIN/NAILS/BIOTIN) TABS Take 1 tablet by mouth daily.    . Probiotic Product (PROBIOTIC PO) Take 1  tablet by mouth daily.    Marland Kitchen losartan (COZAAR) 50 MG tablet Take 50 mg by mouth daily.     No facility-administered medications prior to visit.      Allergies:   Ivp dye [iodinated diagnostic agents]; Penicillins; and Sulfa antibiotics   Social History   Social History  . Marital status: Legally Separated    Spouse name: N/A  . Number of children: N/A  . Years of education: N/A   Social History Main Topics  . Smoking status: Current Some Day Smoker  . Smokeless tobacco: Never Used  . Alcohol use No  . Drug use: No  . Sexual activity: Not Asked   Other Topics Concern  . None   Social History Narrative  . None     Family History:  The patient's   family history includes Heart attack (age of onset: 58) in her sister; Heart attack (age of onset: 24) in her  father; Kidney failure in her mother.   ROS:   Please see the history of present illness.    Review of Systems  Constitution: Positive for weakness.  HENT: Negative.   Eyes: Negative.   Cardiovascular: Positive for dyspnea on exertion.  Respiratory: Positive for snoring and wheezing.   Hematologic/Lymphatic: Bruises/bleeds easily.  Musculoskeletal: Positive for arthritis, back pain, joint swelling, myalgias and stiffness. Negative for joint pain.  Gastrointestinal: Negative.   Genitourinary: Negative.   Neurological: Positive for loss of balance.   All other systems reviewed and are negative.   PHYSICAL EXAM:   VS:  BP (!) 162/88   Pulse 66   Ht 5' 7.5" (1.715 m)   Wt 173 lb 9.6 oz (78.7 kg)   BMI 26.79 kg/m   Physical Exam  GEN: Well nourished, well developed, in no acute distress  Neck: no JVD, carotid bruits, or masses Cardiac:RRR;Positive S4, no murmur or rub nontender to palpation  Respiratory:  clear to auscultation bilaterally, normal work of breathing GI: soft, nontender, nondistended, + BS Ext: without cyanosis, clubbing, or edema, Good distal pulses bilaterally Neuro:  Alert and Oriented x 3 Psych: euthymic mood, full affect  Wt Readings from Last 3 Encounters:  12/02/16 173 lb 9.6 oz (78.7 kg)  08/02/16 172 lb (78 kg)      Studies/Labs Reviewed:   EKG:  EKG is not ordered today.     Recent Labs: 08/01/2016: B Natriuretic Peptide 41.4; Hemoglobin 11.8; Platelets 326 08/02/2016: ALT 21; BUN 24; Creatinine, Ser 1.39; Potassium 4.1; Sodium 143; TSH 1.215   Lipid Panel No results found for: CHOL, TRIG, HDL, CHOLHDL, VLDL, LDLCALC, LDLDIRECT  Additional studies/ records that were reviewed today include:  2-D echo 07/2016 Study Conclusions   - Left ventricle: The cavity size was normal. Wall thickness was   normal. Systolic function was normal. The estimated ejection   fraction was in the range of 60% to 65%. Wall motion was normal;   there were no regional  wall motion abnormalities. Left   ventricular diastolic function parameters were normal. - Atrial septum: No defect or patent foramen ovale was identified.      ASSESSMENT:    1. Essential hypertension   2. Chest pain, unspecified type   3. Accelerated hypertension   4. Chronic back pain, unspecified back location, unspecified back pain laterality      PLAN:  In order of problems listed above:  Chest pain felt to be musculoskeletal when hospitalized 07/2016. CPKs were elevated so statin was stopped. Troponins were negative. Chest pain  is much better. No further testing recommended. F/U with Dr. Radford Pax in 3-4 months.  Hypertension patient's blood pressure remains elevated. Metoprolol was stopped because of bradycardia and Norvasc was stopped for unclear reasons. I will increase losartan to 50 mg twice a day. 2 g sodium diet. Refer to primary care for management.   Chronic back pain. Patient used to see Dr. Francesco Runner for pain management but he left the area. Will refer to pain clinic for management.    Medication Adjustments/Labs and Tests Ordered: Current medicines are reviewed at length with the patient today.  Concerns regarding medicines are outlined above.  Medication changes, Labs and Tests ordered today are listed in the Patient Instructions below. Patient Instructions   Medication Instructions:   START TAKING LOSARTAN 38 MG ONCE A DAY   If you need a refill on your cardiac medications before your next appointment, please call your pharmacy.  Labwork: NONE ORDERED  TODAY    Testing/Procedures: NONE ORDERED  TODAY    Follow-Up:  IN 3 TO 4 MONTHS  WITH DR TURNER    Any Other Special Instructions Will Be Listed Below (If Applicable).    Low-Sodium Eating Plan Sodium, which is an element that makes up salt, helps you maintain a healthy balance of fluids in your body. Too much sodium can increase your blood pressure and cause fluid and waste to be held in your  body. Your health care provider or dietitian may recommend following this plan if you have high blood pressure (hypertension), kidney disease, liver disease, or heart failure. Eating less sodium can help lower your blood pressure, reduce swelling, and protect your heart, liver, and kidneys. What are tips for following this plan? General guidelines  Most people on this plan should limit their sodium intake to 1,500-2,000 mg (milligrams) of sodium each day. Reading food labels  The Nutrition Facts label lists the amount of sodium in one serving of the food. If you eat more than one serving, you must multiply the listed amount of sodium by the number of servings.  Choose foods with less than 140 mg of sodium per serving.  Avoid foods with 300 mg of sodium or more per serving. Shopping  Look for lower-sodium products, often labeled as "low-sodium" or "no salt added."  Always check the sodium content even if foods are labeled as "unsalted" or "no salt added".  Buy fresh foods. ? Avoid canned foods and premade or frozen meals. ? Avoid canned, cured, or processed meats  Buy breads that have less than 80 mg of sodium per slice. Cooking  Eat more home-cooked food and less restaurant, buffet, and fast food.  Avoid adding salt when cooking. Use salt-free seasonings or herbs instead of table salt or sea salt. Check with your health care provider or pharmacist before using salt substitutes.  Cook with plant-based oils, such as canola, sunflower, or olive oil. Meal planning  When eating at a restaurant, ask that your food be prepared with less salt or no salt, if possible.  Avoid foods that contain MSG (monosodium glutamate). MSG is sometimes added to [place] food, bouillon, and some canned foods. What foods are recommended? The items listed may not be a complete list. Talk with your dietitian about what dietary choices are best for you. Grains Low-sodium cereals, including oats, puffed  wheat and rice, and shredded wheat. Low-sodium crackers. Unsalted rice. Unsalted pasta. Low-sodium bread. Whole-grain breads and whole-grain pasta. Vegetables Fresh or frozen vegetables. "No salt added" canned vegetables. "No salt  added" tomato sauce and paste. Low-sodium or reduced-sodium tomato and vegetable juice. Fruits Fresh, frozen, or canned fruit. Fruit juice. Meats and other protein foods Fresh or frozen (no salt added) meat, poultry, seafood, and fish. Low-sodium canned tuna and salmon. Unsalted nuts. Dried peas, beans, and lentils without added salt. Unsalted canned beans. Eggs. Unsalted nut butters. Dairy Milk. Soy milk. Cheese that is naturally low in sodium, such as ricotta cheese, fresh mozzarella, or Swiss cheese Low-sodium or reduced-sodium cheese. Cream cheese. Yogurt. Fats and oils Unsalted butter. Unsalted margarine with no trans fat. Vegetable oils such as canola or olive oils. Seasonings and other foods Fresh and dried herbs and spices. Salt-free seasonings. Low-sodium mustard and ketchup. Sodium-free salad dressing. Sodium-free light mayonnaise. Fresh or refrigerated horseradish. Lemon juice. Vinegar. Homemade, reduced-sodium, or low-sodium soups. Unsalted popcorn and pretzels. Low-salt or salt-free chips. What foods are not recommended? The items listed may not be a complete list. Talk with your dietitian about what dietary choices are best for you. Grains Instant hot cereals. Bread stuffing, pancake, and biscuit mixes. Croutons. Seasoned rice or pasta mixes. Noodle soup cups. Boxed or frozen macaroni and cheese. Regular salted crackers. Self-rising flour. Vegetables Sauerkraut, pickled vegetables, and relishes. Olives. [place] fries. Onion rings. Regular canned vegetables (not low-sodium or reduced-sodium). Regular canned tomato sauce and paste (not low-sodium or reduced-sodium). Regular tomato and vegetable juice (not low-sodium or reduced-sodium). Frozen vegetables in  sauces. Meats and other protein foods Meat or fish that is salted, canned, smoked, spiced, or pickled. Bacon, ham, sausage, hotdogs, corned beef, chipped beef, packaged lunch meats, salt pork, jerky, pickled herring, anchovies, regular canned tuna, sardines, salted nuts. Dairy Processed cheese and cheese spreads. Cheese curds. Blue cheese. Feta cheese. String cheese. Regular cottage cheese. Buttermilk. Canned milk. Fats and oils Salted butter. Regular margarine. Ghee. Bacon fat. Seasonings and other foods Onion salt, garlic salt, seasoned salt, table salt, and sea salt. Canned and packaged gravies. Worcestershire sauce. Tartar sauce. Barbecue sauce. Teriyaki sauce. Soy sauce, including reduced-sodium. Steak sauce. Fish sauce. Oyster sauce. Cocktail sauce. Horseradish that you find on the shelf. Regular ketchup and mustard. Meat flavorings and tenderizers. Bouillon cubes. Hot sauce and Tabasco sauce. Premade or packaged marinades. Premade or packaged taco seasonings. Relishes. Regular salad dressings. Salsa. Potato and tortilla chips. Corn chips and puffs. Salted popcorn and pretzels. Canned or dried soups. Pizza. Frozen entrees and pot pies. Summary  Eating less sodium can help lower your blood pressure, reduce swelling, and protect your heart, liver, and kidneys.  Most people on this plan should limit their sodium intake to 1,500-2,000 mg (milligrams) of sodium each day.  Canned, boxed, and frozen foods are high in sodium. Restaurant foods, fast foods, and pizza are also very high in sodium. You also get sodium by adding salt to food.  Try to cook at home, eat more fresh fruits and vegetables, and eat less fast food, canned, processed, or prepared foods. This information is not intended to replace advice given to you by your health care provider. Make sure you discuss any questions you have with your health care provider. Document Released: 11/20/2001 Document Revised: 05/24/2016 Document  Reviewed: 05/24/2016 Elsevier Interactive Patient Education  2017 [employer].  Sumner Boast, PA-C  12/02/2016 9:49 AM    [facility] [place], [place], [place]  [address] Phone: [phone]; Fax: [phone]

## 2016-12-02 ENCOUNTER — Encounter: Payer: Self-pay | Admitting: Physician Assistant

## 2016-12-02 ENCOUNTER — Ambulatory Visit (INDEPENDENT_AMBULATORY_CARE_PROVIDER_SITE_OTHER): Payer: Medicare HMO | Admitting: Physician Assistant

## 2016-12-02 ENCOUNTER — Encounter (INDEPENDENT_AMBULATORY_CARE_PROVIDER_SITE_OTHER): Payer: Self-pay

## 2016-12-02 VITALS — BP 162/88 | HR 66 | Ht 67.5 in | Wt 173.6 lb

## 2016-12-02 DIAGNOSIS — I10 Essential (primary) hypertension: Secondary | ICD-10-CM

## 2016-12-02 DIAGNOSIS — M549 Dorsalgia, unspecified: Secondary | ICD-10-CM | POA: Diagnosis not present

## 2016-12-02 DIAGNOSIS — G8929 Other chronic pain: Secondary | ICD-10-CM | POA: Diagnosis not present

## 2016-12-02 DIAGNOSIS — R079 Chest pain, unspecified: Secondary | ICD-10-CM | POA: Diagnosis not present

## 2016-12-02 MED ORDER — LOSARTAN POTASSIUM 50 MG PO TABS
50.0000 mg | ORAL_TABLET | Freq: Two times a day (BID) | ORAL | 3 refills | Status: DC
Start: 2016-12-02 — End: 2017-02-25

## 2016-12-02 NOTE — Addendum Note (Signed)
Addended by: Claude Manges on: 12/02/2016 02:08 PM   Modules accepted: Orders

## 2016-12-02 NOTE — Patient Instructions (Addendum)
Medication Instructions:   START TAKING LOSARTAN 50 MG  TWICE  A DAY   If you need a refill on your cardiac medications before your next appointment, please call your pharmacy.  Labwork: NONE ORDERED  TODAY    Testing/Procedures: NONE ORDERED  TODAY    Follow-Up:  IN 3 TO 4 MONTHS  WITH DR TURNER   You have been referred to [facility] PROVIDER   Any Other Special Instructions Will Be Listed Below (If Applicable).    Low-Sodium Eating Plan Sodium, which is an element that makes up salt, helps you maintain a healthy balance of fluids in your body. Too much sodium can increase your blood pressure and cause fluid and waste to be held in your body. Your health care provider or dietitian may recommend following this plan if you have high blood pressure (hypertension), kidney disease, liver disease, or heart failure. Eating less sodium can help lower your blood pressure, reduce swelling, and protect your heart, liver, and kidneys. What are tips for following this plan? General guidelines  Most people on this plan should limit their sodium intake to 1,500-2,000 mg (milligrams) of sodium each day. Reading food labels  The Nutrition Facts label lists the amount of sodium in one serving of the food. If you eat more than one serving, you must multiply the listed amount of sodium by the number of servings.  Choose foods with less than 140 mg of sodium per serving.  Avoid foods with 300 mg of sodium or more per serving. Shopping  Look for lower-sodium products, often labeled as "low-sodium" or "no salt added."  Always check the sodium content even if foods are labeled as "unsalted" or "no salt added".  Buy fresh foods. ? Avoid canned foods and premade or frozen meals. ? Avoid canned, cured, or processed meats  Buy breads that have less than 80 mg of sodium per slice. Cooking  Eat more home-cooked food and less  restaurant, buffet, and fast food.  Avoid adding salt when cooking. Use salt-free seasonings or herbs instead of table salt or sea salt. Check with your health care provider or pharmacist before using salt substitutes.  Cook with plant-based oils, such as canola, sunflower, or olive oil. Meal planning  When eating at a restaurant, ask that your food be prepared with less salt or no salt, if possible.  Avoid foods that contain MSG (monosodium glutamate). MSG is sometimes added to [place] food, bouillon, and some canned foods. What foods are recommended? The items listed may not be a complete list. Talk with your dietitian about what dietary choices are best for you. Grains Low-sodium cereals, including oats, puffed wheat and rice, and shredded wheat. Low-sodium crackers. Unsalted rice. Unsalted pasta. Low-sodium bread. Whole-grain breads and whole-grain pasta. Vegetables Fresh or frozen vegetables. "No salt added" canned vegetables. "No salt added" tomato sauce and paste. Low-sodium or reduced-sodium tomato and vegetable juice. Fruits Fresh, frozen, or canned fruit. Fruit juice. Meats and other protein foods Fresh or frozen (no salt added) meat, poultry, seafood, and fish. Low-sodium canned tuna and salmon. Unsalted nuts. Dried peas, beans, and lentils without added salt. Unsalted canned beans. Eggs. Unsalted nut butters. Dairy Milk. Soy milk. Cheese that is naturally low in sodium, such as ricotta cheese, fresh mozzarella, or Swiss cheese Low-sodium or reduced-sodium cheese. Cream cheese. Yogurt. Fats and oils Unsalted butter. Unsalted margarine with no trans fat. Vegetable oils such as canola or olive  oils. Seasonings and other foods Fresh and dried herbs and spices. Salt-free seasonings. Low-sodium mustard and ketchup. Sodium-free salad dressing. Sodium-free light mayonnaise. Fresh or refrigerated horseradish. Lemon juice. Vinegar. Homemade, reduced-sodium, or low-sodium soups. Unsalted  popcorn and pretzels. Low-salt or salt-free chips. What foods are not recommended? The items listed may not be a complete list. Talk with your dietitian about what dietary choices are best for you. Grains Instant hot cereals. Bread stuffing, pancake, and biscuit mixes. Croutons. Seasoned rice or pasta mixes. Noodle soup cups. Boxed or frozen macaroni and cheese. Regular salted crackers. Self-rising flour. Vegetables Sauerkraut, pickled vegetables, and relishes. Olives. [place] fries. Onion rings. Regular canned vegetables (not low-sodium or reduced-sodium). Regular canned tomato sauce and paste (not low-sodium or reduced-sodium). Regular tomato and vegetable juice (not low-sodium or reduced-sodium). Frozen vegetables in sauces. Meats and other protein foods Meat or fish that is salted, canned, smoked, spiced, or pickled. Bacon, ham, sausage, hotdogs, corned beef, chipped beef, packaged lunch meats, salt pork, jerky, pickled herring, anchovies, regular canned tuna, sardines, salted nuts. Dairy Processed cheese and cheese spreads. Cheese curds. Blue cheese. Feta cheese. String cheese. Regular cottage cheese. Buttermilk. Canned milk. Fats and oils Salted butter. Regular margarine. Ghee. Bacon fat. Seasonings and other foods Onion salt, garlic salt, seasoned salt, table salt, and sea salt. Canned and packaged gravies. Worcestershire sauce. Tartar sauce. Barbecue sauce. Teriyaki sauce. Soy sauce, including reduced-sodium. Steak sauce. Fish sauce. Oyster sauce. Cocktail sauce. Horseradish that you find on the shelf. Regular ketchup and mustard. Meat flavorings and tenderizers. Bouillon cubes. Hot sauce and Tabasco sauce. Premade or packaged marinades. Premade or packaged taco seasonings. Relishes. Regular salad dressings. Salsa. Potato and tortilla chips. Corn chips and puffs. Salted popcorn and pretzels. Canned or dried soups. Pizza. Frozen entrees and pot pies. Summary  Eating less sodium can help lower  your blood pressure, reduce swelling, and protect your heart, liver, and kidneys.  Most people on this plan should limit their sodium intake to 1,500-2,000 mg (milligrams) of sodium each day.  Canned, boxed, and frozen foods are high in sodium. Restaurant foods, fast foods, and pizza are also very high in sodium. You also get sodium by adding salt to food.  Try to cook at home, eat more fresh fruits and vegetables, and eat less fast food, canned, processed, or prepared foods. This information is not intended to replace advice given to you by your health care provider. Make sure you discuss any questions you have with your health care provider. Document Released: 11/20/2001 Document Revised: 05/24/2016 Document Reviewed: 05/24/2016 Elsevier Interactive Patient Education  2017 [employer].

## 2016-12-06 ENCOUNTER — Other Ambulatory Visit: Payer: Self-pay | Admitting: Physician Assistant

## 2016-12-07 ENCOUNTER — Telehealth: Payer: Self-pay | Admitting: Physician Assistant

## 2016-12-07 NOTE — Telephone Encounter (Signed)
New message     Pt is calling about a referral to Dr. Neita Garnet. She said the phone number is [phone] and the fax number is [phone].

## 2016-12-08 ENCOUNTER — Telehealth: Payer: Self-pay | Admitting: *Deleted

## 2016-12-08 NOTE — Telephone Encounter (Signed)
SPOKE TO PATIENT TO MAKE SURE SHE IS ARE AWARE A PAPER REFERRAL AND OFFICE VISIT NOTE  HAS BEEN SENT OVER TO DR. [name] OFFICE FOR REVIEW.  PT WAS TOLD THAT THE OFFICE SHOULD BE CONTACTING HER BACK FOR AN APPOINTMENT BUT GIVE THEM AT LEAST 48 HOURS.  PT WAS VERY HAPPY THAT SHE HAS THE REFERRAL LOCALLY CLOSE TO HOME.

## 2016-12-08 NOTE — Addendum Note (Signed)
Addended by: Claude Manges on: 12/08/2016 11:24 AM   Modules accepted: Orders

## 2016-12-08 NOTE — Addendum Note (Signed)
Addended by: Claude Manges on: 12/08/2016 12:15 PM   Modules accepted: Orders

## 2016-12-08 NOTE — Addendum Note (Signed)
Addended by: Claude Manges on: 12/08/2016 01:53 PM   Modules accepted: Orders

## 2017-01-11 ENCOUNTER — Encounter: Payer: Self-pay | Admitting: Physical Medicine & Rehabilitation

## 2017-01-31 ENCOUNTER — Ambulatory Visit: Payer: Medicare HMO | Admitting: Physical Medicine & Rehabilitation

## 2017-02-07 ENCOUNTER — Ambulatory Visit: Payer: Medicare HMO | Admitting: Physical Medicine & Rehabilitation

## 2017-02-17 ENCOUNTER — Ambulatory Visit (HOSPITAL_BASED_OUTPATIENT_CLINIC_OR_DEPARTMENT_OTHER): Payer: Medicare HMO | Admitting: Physical Medicine & Rehabilitation

## 2017-02-17 ENCOUNTER — Ambulatory Visit
Admission: RE | Admit: 2017-02-17 | Discharge: 2017-02-17 | Disposition: A | Discharge status: Home / Self Care | Payer: Medicare HMO | Source: Ambulatory Visit | Attending: Physical Medicine & Rehabilitation | Admitting: Physical Medicine & Rehabilitation

## 2017-02-17 ENCOUNTER — Encounter: Payer: Medicare HMO | Attending: Physical Medicine & Rehabilitation

## 2017-02-17 ENCOUNTER — Encounter: Payer: Self-pay | Admitting: Physical Medicine & Rehabilitation

## 2017-02-17 VITALS — BP 186/94 | HR 82

## 2017-02-17 DIAGNOSIS — M545 Low back pain: Secondary | ICD-10-CM | POA: Diagnosis not present

## 2017-02-17 DIAGNOSIS — M961 Postlaminectomy syndrome, not elsewhere classified: Secondary | ICD-10-CM

## 2017-02-17 DIAGNOSIS — G8929 Other chronic pain: Secondary | ICD-10-CM

## 2017-02-17 DIAGNOSIS — I10 Essential (primary) hypertension: Secondary | ICD-10-CM | POA: Insufficient documentation

## 2017-02-17 DIAGNOSIS — Z720 Tobacco use: Secondary | ICD-10-CM | POA: Diagnosis not present
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
## 2017-02-25 ENCOUNTER — Other Ambulatory Visit: Payer: Self-pay

## 2017-02-25 ENCOUNTER — Encounter: Payer: Self-pay | Admitting: Cardiology

## 2017-02-25 MED ORDER — LOSARTAN POTASSIUM 50 MG PO TABS: 50.0000 mg | ORAL_TABLET | Freq: Two times a day (BID) | ORAL | 0 refills | Status: DC

## 2017-03-01 ENCOUNTER — Ambulatory Visit: Payer: Medicare HMO | Admitting: Physical Therapy

## 2017-03-10 ENCOUNTER — Ambulatory Visit: Payer: Medicare HMO | Admitting: Cardiology

## 2017-03-15 ENCOUNTER — Ambulatory Visit: Payer: Medicare HMO | Admitting: Cardiology

## 2017-03-17 ENCOUNTER — Encounter: Payer: Medicare HMO | Attending: Physical Medicine & Rehabilitation

## 2017-03-17 ENCOUNTER — Ambulatory Visit: Payer: Medicare HMO | Admitting: Physical Medicine & Rehabilitation

## 2017-03-17 ENCOUNTER — Ambulatory Visit: Payer: Medicare HMO | Admitting: Cardiology

## 2017-03-17 DIAGNOSIS — I10 Essential (primary) hypertension: Secondary | ICD-10-CM | POA: Insufficient documentation

## 2017-03-17 DIAGNOSIS — Z720 Tobacco use: Secondary | ICD-10-CM | POA: Insufficient documentation

## 2017-03-17 DIAGNOSIS — M961 Postlaminectomy syndrome, not elsewhere classified: Secondary | ICD-10-CM | POA: Insufficient documentation

## 2017-03-17 DIAGNOSIS — M545 Low back pain: Secondary | ICD-10-CM | POA: Insufficient documentation

## 2017-03-17 DIAGNOSIS — G8929 Other chronic pain: Secondary | ICD-10-CM | POA: Insufficient documentation

## 2017-03-18 ENCOUNTER — Other Ambulatory Visit: Payer: Self-pay | Admitting: Cardiology

## 2017-04-11 ENCOUNTER — Ambulatory Visit: Payer: Medicare HMO | Admitting: Physician Assistant

## 2017-04-11 NOTE — Progress Notes (Deleted)
Cardiology Office Note    Date:  04/11/2017   ID:  Patricia Dominguez, DOB 03/06/1956, MRN [id number]  PCP:  Patient, No Pcp Per  Cardiologist: Dr. Radford Pax  No chief complaint on file.   History of Present Illness:  Patricia Dominguez is a 61 y.o. female with history of atypical chest pain 07/2016 with minimally elevated troponins with flat trend in the setting of mild renal insufficiency. Her CPKs were way out of proportion to the degree of MB and she had a normal relative index. CRF include remote tobacco use, family history of CAD and MI at an early age in her father and hypertension. EKG was normal. 2-D echo 07/1998 8T normal LVEF 60-65% with no wall motion abnormalities. No stress test was recommended. Statin was stopped and beta blocker stopped because of bradycardia.  I saw her in follow-up 11/2016 complaining of elevated blood pressures. She was also asking for referral to primary care in the pain clinic for her chronic pain. I increased her losartan to 50 mg twice a day and 2 g sodium diet. She had been on Norvasc in the past but this was stopped for unclear reasons.    Past Medical History:  Diagnosis Date  . Back pain   . Breast pain   . Hyperlipidemia   . Hypertension   . Neck pain     No past surgical history on file.  Current Medications: No outpatient prescriptions have been marked as taking for the 04/11/17 encounter (Appointment) with Imogene Burn, PA-C.     Allergies:   Ivp dye [iodinated diagnostic agents]; Penicillins; and Sulfa antibiotics   Social History   Social History  . Marital status: Legally Separated    Spouse name: N/A  . Number of children: N/A  . Years of education: N/A   Social History Main Topics  . Smoking status: Current Some Day Smoker  . Smokeless tobacco: Never Used  . Alcohol use No  . Drug use: No  . Sexual activity: Not on file   Other Topics Concern  . Not on file   Social History Narrative  . No narrative on file      Family History:  The patient's ***family history includes Heart attack (age of onset: 64) in her sister; Heart attack (age of onset: 34) in her father; Kidney failure in her mother.   ROS:   Please see the history of present illness.    ROS All other systems reviewed and are negative.   PHYSICAL EXAM:   VS:  There were no vitals taken for this visit.  Physical Exam  GEN: Well nourished, well developed, in no acute distress  HEENT: normal  Neck: no JVD, carotid bruits, or masses Cardiac:RRR; no murmurs, rubs, or gallops  Respiratory:  clear to auscultation bilaterally, normal work of breathing GI: soft, nontender, nondistended, + BS Ext: without cyanosis, clubbing, or edema, Good distal pulses bilaterally MS: no deformity or atrophy  Skin: warm and dry, no rash Neuro:  Alert and Oriented x 3, Strength and sensation are intact Psych: euthymic mood, full affect  Wt Readings from Last 3 Encounters:  12/02/16 173 lb 9.6 oz (78.7 kg)  08/02/16 172 lb (78 kg)      Studies/Labs Reviewed:   EKG:  EKG is*** ordered today.  The ekg ordered today demonstrates ***  Recent Labs: 08/01/2016: B Natriuretic Peptide 41.4; Hemoglobin 11.8; Platelets 326 08/02/2016: ALT 21; BUN 24; Creatinine, Ser 1.39; Potassium 4.1; Sodium 143; TSH 1.215  Lipid Panel No results found for: CHOL, TRIG, HDL, CHOLHDL, VLDL, LDLCALC, LDLDIRECT  Additional studies/ records that were reviewed today include:  ***    ASSESSMENT:    No diagnosis found.   PLAN:  In order of problems listed above:      Medication Adjustments/Labs and Tests Ordered: Current medicines are reviewed at length with the patient today.  Concerns regarding medicines are outlined above.  Medication changes, Labs and Tests ordered today are listed in the Patient Instructions below. There are no Patient Instructions on file for this visit.   Signed, Ermalinda Barrios, PA-C  04/11/2017 9:20 AM    [facility]  [facility], [place], [place]  [address] Phone: [phone]; Fax: [phone]

## 2017-04-14 ENCOUNTER — Encounter: Payer: Self-pay | Admitting: Physician Assistant

## 2017-07-12 DIAGNOSIS — E785 Hyperlipidemia, unspecified: Secondary | ICD-10-CM | POA: Insufficient documentation

## 2017-07-12 NOTE — Progress Notes (Deleted)
Cardiology Office Note:    Date:  07/12/2017   ID:  Patricia Dominguez, DOB 1955-09-04, MRN [id number]  PCP:  Patient, No Pcp Per  Cardiologist:  No primary care provider on file.    Referring MD: No ref. provider found   No chief complaint on file.   History of Present Illness:    Patricia Dominguez is a 62 y.o. female with a hx of of hyperlipidemia and HTN and atypical chest pain a year ago with elevated trop in setting of AKI and EKG was normal.  Echo showed normal LVF with EF 60-65% with normal wall motion.  It was felt that her CP was MSK and CPK was elevated out of proportion to her RI and felt to be related to statin which was stopped.  At last OV his BP was elevated and his losartan was increased.    He is here today for followup and is doing well.  He denies any chest pain or pressure, SOB, DOE, PND, orthopnea, LE edema, dizziness, palpitations or syncope. He is compliant with his meds and is tolerating meds with no SE.    Past Medical History:  Diagnosis Date  . Back pain   . Breast pain   . Hyperlipidemia   . Hypertension   . Neck pain     No past surgical history on file.  Current Medications: No outpatient medications have been marked as taking for the 07/14/17 encounter (Appointment) with Sueanne Margarita, MD.     Allergies:   Ivp dye [iodinated diagnostic agents]; Penicillins; and Sulfa antibiotics   Social History   Socioeconomic History  . Marital status: Legally Separated    Spouse name: Not on file  . Number of children: Not on file  . Years of education: Not on file  . Highest education level: Not on file  Social Needs  . Financial resource strain: Not on file  . Food insecurity - worry: Not on file  . Food insecurity - inability: Not on file  . Transportation needs - medical: Not on file  . Transportation needs - non-medical: Not on file  Occupational History  . Not on file  Tobacco Use  . Smoking status: Current Some Day Smoker  . Smokeless tobacco:  Never Used  Substance and Sexual Activity  . Alcohol use: No  . Drug use: No  . Sexual activity: Not on file  Other Topics Concern  . Not on file  Social History Narrative  . Not on file     Family History: The patient's family history includes Heart attack (age of onset: 42) in her sister; Heart attack (age of onset: 57) in her father; Kidney failure in her mother.  ROS:   Please see the history of present illness.    ROS  All other systems reviewed and negative.   EKGs/Labs/Other Studies Reviewed:    The following studies were reviewed today: none  EKG:  EKG is  ordered today.  The ekg ordered today demonstrates ***  Recent Labs: 08/01/2016: B Natriuretic Peptide 41.4; Hemoglobin 11.8; Platelets 326 08/02/2016: ALT 21; BUN 24; Creatinine, Ser 1.39; Potassium 4.1; Sodium 143; TSH 1.215   Recent Lipid Panel No results found for: CHOL, TRIG, HDL, CHOLHDL, VLDL, LDLCALC, LDLDIRECT  Physical Exam:    VS:  There were no vitals taken for this visit.    Wt Readings from Last 3 Encounters:  12/02/16 173 lb 9.6 oz (78.7 kg)  08/02/16 172 lb (78 kg)     GEN:  Well nourished, well developed in no acute distress HEENT: Normal NECK: No JVD; No carotid bruits LYMPHATICS: No lymphadenopathy CARDIAC: RRR, no murmurs, rubs, gallops RESPIRATORY:  Clear to auscultation without rales, wheezing or rhonchi  ABDOMEN: Soft, non-tender, non-distended MUSCULOSKELETAL:  No edema; No deformity  SKIN: Warm and dry NEUROLOGIC:  Alert and oriented x 3 PSYCHIATRIC:  Normal affect   ASSESSMENT:    1. Benign essential HTN   2. Hyperlipidemia LDL goal <70   3. Chest pain, unspecified type    PLAN:    In order of problems listed above:  1.  HTN - Her BP is well controlled on exam today.  She will continue on Losartan 50mg  BID.  2.  Hyperlipidemia - this is followed by her PCP.   3.  Chest pain - atypical and felt to be MSK from statin.  This has resolved.    Medication  Adjustments/Labs and Tests Ordered: Current medicines are reviewed at length with the patient today.  Concerns regarding medicines are outlined above.  No orders of the defined types were placed in this encounter.  No orders of the defined types were placed in this encounter.   Signed, Fransico Him, MD  07/12/2017 9:59 PM    [place]

## 2017-07-14 ENCOUNTER — Ambulatory Visit: Payer: Medicare HMO | Admitting: Cardiology

## 2017-07-25 ENCOUNTER — Encounter: Payer: Self-pay | Admitting: Cardiology

## 2017-07-25 ENCOUNTER — Ambulatory Visit (INDEPENDENT_AMBULATORY_CARE_PROVIDER_SITE_OTHER): Payer: Medicare HMO | Admitting: Cardiology

## 2017-07-25 VITALS — BP 154/88 | HR 64 | Ht 67.5 in | Wt 181.0 lb

## 2017-07-25 DIAGNOSIS — I10 Essential (primary) hypertension: Secondary | ICD-10-CM | POA: Diagnosis not present

## 2017-07-25 DIAGNOSIS — R079 Chest pain, unspecified: Secondary | ICD-10-CM

## 2017-07-25 DIAGNOSIS — E785 Hyperlipidemia, unspecified: Secondary | ICD-10-CM

## 2017-07-25 LAB — BASIC METABOLIC PANEL
BUN / CREAT RATIO: 21 (ref 9–23)
BUN: 41 mg/dL — ABNORMAL HIGH (ref 6–24)
CALCIUM: 9.4 mg/dL (ref 8.7–10.2)
CO2: 18 mmol/L — ABNORMAL LOW (ref 20–29)
Chloride: 105 mmol/L (ref 96–106)
Creatinine, Ser: 1.95 mg/dL — ABNORMAL HIGH (ref 0.57–1.00)
GFR calc non Af Amer: 28 mL/min/{1.73_m2} — ABNORMAL LOW (ref >59)
GFR, EST AFRICAN AMERICAN: 32 mL/min/{1.73_m2} — AB (ref >59)
GLUCOSE: 97 mg/dL (ref 65–99)
POTASSIUM: 5.3 mmol/L — AB (ref 3.5–5.2)
Sodium: 139 mmol/L (ref 134–144)

## 2017-07-25 NOTE — Patient Instructions (Signed)
Medication Instructions:  Your physician recommends that you continue on your current medications as directed. Please refer to the Current Medication list given to you today.  If you need a refill on your cardiac medications, please contact your pharmacy first.  Labwork: Today for kidney function test   Testing/Procedures: Your physician has requested that you have an exercise tolerance test. For further information please visit [email or web address]. Please also follow instruction sheet, as given.  Follow-Up: Your physician wants you to follow-up as needed with Dr. Radford Pax   Any Other Special Instructions Will Be Listed Below (If Applicable).   Thank you for choosing [facility], RN  [phone]  If you need a refill on your cardiac medications before your next appointment, please call your pharmacy.
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
## 2017-07-26 ENCOUNTER — Other Ambulatory Visit: Payer: Self-pay | Admitting: Cardiology

## 2017-07-26 ENCOUNTER — Telehealth: Payer: Self-pay

## 2017-07-26 DIAGNOSIS — I701 Atherosclerosis of renal artery: Secondary | ICD-10-CM

## 2017-07-26 MED ORDER — HYDRALAZINE HCL 10 MG PO TABS: 10.0000 mg | ORAL_TABLET | Freq: Three times a day (TID) | ORAL | 0 refills | Status: DC

## 2017-07-26 NOTE — Telephone Encounter (Signed)
Notes recorded by Teressa Senter, RN on 07/26/2017 at 10:41 AM EST Patient made aware of lab results.  Pt instructed to STOP losartan and lasix and START hydralazine 10 mg three times a day.  Patient scheduled for BMET on Friday. Pt stated she lives in [place] so she will see if she can get labs drawn at her PCP. Patient scheduled at the HTN clinic 08/11/17. Patient also made aware of renal duplex.  Patient in agreement with plan and thankful for the call. Renal duplex ordered to be scheduled.   Notes recorded by Sueanne Margarita, MD on 07/25/2017 at 7:19 PM EST Worsening renal function after doubling Losartn. Stop Losartan and get a renal duplex to rule out renal artery stenosis - Please start Hydralazine 10mg  TID and followup with HTN clininc in 1 week. Stop Lasix as well and repeat BMET on Friday

## 2017-07-28 ENCOUNTER — Other Ambulatory Visit: Payer: Medicare HMO | Admitting: *Deleted

## 2017-07-28 DIAGNOSIS — I701 Atherosclerosis of renal artery: Secondary | ICD-10-CM

## 2017-07-29 ENCOUNTER — Other Ambulatory Visit: Payer: Medicare HMO

## 2017-07-29 LAB — BASIC METABOLIC PANEL
BUN / CREAT RATIO: 25 — AB (ref 9–23)
BUN: 49 mg/dL — ABNORMAL HIGH (ref 6–24)
CO2: 18 mmol/L — AB (ref 20–29)
CREATININE: 1.98 mg/dL — AB (ref 0.57–1.00)
Calcium: 9.3 mg/dL (ref 8.7–10.2)
Chloride: 105 mmol/L (ref 96–106)
GFR calc Af Amer: 31 mL/min/{1.73_m2} — ABNORMAL LOW (ref >59)
GFR, EST NON AFRICAN AMERICAN: 27 mL/min/{1.73_m2} — AB (ref >59)
GLUCOSE: 85 mg/dL (ref 65–99)
POTASSIUM: 5.3 mmol/L — AB (ref 3.5–5.2)
Sodium: 142 mmol/L (ref 134–144)

## 2017-08-01 ENCOUNTER — Telehealth (HOSPITAL_COMMUNITY): Payer: Self-pay | Admitting: Cardiology

## 2017-08-02 NOTE — Telephone Encounter (Signed)
User: Cherie Dark A Date/time: 08/01/17 2:07 PM  Comment: Called pt and lmsg for her to CB to r/s ETT appt.   Context:  Outcome: Left Message  Phone number: [phone] Phone Type: Home Phone  Comm. type: Telephone Call type: Outgoing  Contact: [name] Relation to patient: Self

## 2017-08-10 ENCOUNTER — Encounter (HOSPITAL_COMMUNITY): Payer: Medicare HMO

## 2017-08-11 ENCOUNTER — Ambulatory Visit: Payer: Medicare HMO

## 2017-08-11 ENCOUNTER — Ambulatory Visit (HOSPITAL_COMMUNITY)
Admission: RE | Admit: 2017-08-11 | Payer: Medicare HMO | Source: Ambulatory Visit | Attending: Cardiology | Admitting: Cardiology

## 2017-08-16 ENCOUNTER — Telehealth: Payer: Self-pay | Admitting: Radiology

## 2017-08-16 ENCOUNTER — Ambulatory Visit: Payer: Medicare HMO

## 2017-08-16 NOTE — Telephone Encounter (Signed)
Patient came in for her GXT today limping. I asked if she thought she could do the treadmill and she said she was [place] try. She had fallen and hurt her hip 2 weeks ago and looks to still be in a lot of pain. After further discussion I cancelled the GXT for today as I didn't feel it to be safe for her to do. I asked her please to follow up with her doctor about the fall and her hip. She is gonna call back to reschedule after speaking to her doctor about her hip.   Also she mentioned her BP meds were recently changed. Today her BP was 184/93.

## 2017-08-18 ENCOUNTER — Ambulatory Visit (HOSPITAL_COMMUNITY)
Admission: RE | Admit: 2017-08-18 | Discharge: 2017-08-18 | Disposition: A | Discharge status: Home / Self Care | Payer: Medicare HMO | Source: Ambulatory Visit | Attending: Cardiovascular Disease | Admitting: Cardiovascular Disease

## 2017-08-18 DIAGNOSIS — E669 Obesity, unspecified: Secondary | ICD-10-CM | POA: Insufficient documentation

## 2017-08-18 DIAGNOSIS — E785 Hyperlipidemia, unspecified: Secondary | ICD-10-CM | POA: Diagnosis not present

## 2017-08-18 DIAGNOSIS — I10 Essential (primary) hypertension: Secondary | ICD-10-CM | POA: Diagnosis not present

## 2017-08-18 DIAGNOSIS — F17200 Nicotine dependence, unspecified, uncomplicated: Secondary | ICD-10-CM | POA: Diagnosis not present

## 2017-08-18 DIAGNOSIS — N281 Cyst of kidney, acquired: Secondary | ICD-10-CM | POA: Insufficient documentation

## 2017-08-18 DIAGNOSIS — I701 Atherosclerosis of renal artery: Secondary | ICD-10-CM | POA: Diagnosis not present

## 2017-08-23 ENCOUNTER — Encounter: Payer: Self-pay | Admitting: Cardiology

## 2017-08-25 ENCOUNTER — Ambulatory Visit: Payer: Medicare HMO

## 2017-08-30 ENCOUNTER — Ambulatory Visit: Payer: Medicare HMO

## 2017-08-30 NOTE — Progress Notes (Deleted)
Patient ID: Patricia Dominguez                 DOB: Oct 28, 1955                      MRN: [id number]     HPI: Patricia Dominguez is a 62 y.o. female referred by Dr. Radford Pax to HTN clinic. PMH is significant for HTN, HLD, obesity, and atypical chest pain felt to be musculoskeletal in nature. Her statin was stopped and she was seen for follow up a few months later. She still reported some mild chest pain and BP was elevated. Her losartan was increased to 50mg  BID and she was referred to pain management. BMEt on 07/25/17 revealed increase in SCr from 1.39 >> 1.95. Her losartan and Lasix were discontinued and she was started on hydralazine 10mg  TID. A renal duplex was ordered to rule out renal artery stenosis - this came back normal. F/u BMET 3 days later revealed SCr still elevated at 1.98. Pt was advised to f/u with her PCP. She came in for exercise stress test, however was unable to complete this due to hip pain. Her BP was elevated at 184/93.  She has been under a lot of stress with her grandchild being ina bad car accident. Still smoking? Titrate hydralazine if tolerating well, otherwise clonidine  Current HTN meds: amlodipine 10mg  daily, hydralazine 10mg  TID Previously tried: losartan, Lasix - discontinued due to rise in SCr BP goal: <130/58mmHg  Family History: Family history of CAD - father had MI at 46, sister had MI at 48.  Social History: Remote tobacco use. Denies alcohol and illicit drug use.  Diet:   Exercise:   Home BP readings:   Wt Readings from Last 3 Encounters:  07/25/17 181 lb (82.1 kg)  12/02/16 173 lb 9.6 oz (78.7 kg)  08/02/16 172 lb (78 kg)   BP Readings from Last 3 Encounters:  07/25/17 (!) 154/88  02/17/17 (!) 186/94  12/02/16 (!) 162/88   Pulse Readings from Last 3 Encounters:  07/25/17 64  02/17/17 82  12/02/16 66    Renal function: CrCl cannot be calculated (Patient's most recent lab result is older than the maximum 21 days allowed.).  Past Medical History:    Diagnosis Date  . Back pain   . Breast pain   . Hyperlipidemia   . Hypertension   . Neck pain     Current Outpatient Medications on File Prior to Visit  Medication Sig Dispense Refill  . amLODipine (NORVASC) 10 MG tablet Take 1 tablet by mouth daily.    . Ascorbic Acid (VITAMIN C PO) Take 1 tablet by mouth daily.    Marland Kitchen aspirin EC 81 MG EC tablet Take 1 tablet (81 mg total) by mouth daily.    Marland Kitchen atorvastatin (LIPITOR) 20 MG tablet Take 20 mg by mouth daily.    . Brexpiprazole (REXULTI) 1 MG TABS Take 1 tablet by mouth daily.    Marland Kitchen buPROPion (WELLBUTRIN SR) 100 MG 12 hr tablet Take 100 mg by mouth See admin instructions. Take 1 tablet (100 mg) by mouth twice daily - 9am and 5pm    . citalopram (CELEXA) 40 MG tablet Take 40 mg by mouth daily.    . cyclobenzaprine (FLEXERIL) 10 MG tablet Take 10 mg by mouth 2 (two) times daily.    Marland Kitchen gabapentin (NEURONTIN) 300 MG capsule Take 600 mg by mouth 3 (three) times daily.    . hydrALAZINE (APRESOLINE) 10 MG tablet TAKE  1 TABLET(10 MG) BY MOUTH THREE TIMES DAILY 270 tablet 3  . HYDROcodone-acetaminophen (NORCO) 10-325 MG tablet Take 1 tablet by mouth 3 (three) times daily as needed for severe pain.    . Lidocaine, Anorectal, 5 % CREA Apply 1 application topically as needed for pain.    . Melatonin 5 MG CAPS Take 5 mg by mouth at bedtime.    . Multiple Vitamin (MULTIVITAMIN WITH MINERALS) TABS tablet Take 1 tablet by mouth daily.    . Multiple Vitamins-Minerals (HAIR/SKIN/NAILS/BIOTIN) TABS Take 1 tablet by mouth daily.    . Probiotic Product (PROBIOTIC PO) Take 1 tablet by mouth daily.     No current facility-administered medications on file prior to visit.     Allergies  Allergen Reactions  . Ivp Dye [Iodinated Diagnostic Agents] Nausea And Vomiting  . Penicillins Other (See Comments)    Passed out after penicillin injection at 63 years old.   . Sulfa Antibiotics Other (See Comments)    Unknown allergic reaction     Assessment/Plan:  1.  Hypertension -

## 2017-09-15 ENCOUNTER — Ambulatory Visit: Payer: Medicare HMO

## 2017-10-10 ENCOUNTER — Ambulatory Visit: Payer: Medicare HMO | Admitting: Pharmacist

## 2017-10-10 NOTE — Progress Notes (Deleted)
Patient ID: Patricia Dominguez                 DOB: 07-19-1955                      MRN: [id number]     HPI: Patricia Dominguez is a 62 y.o. female referred by Dr. Radford Pax to HTN clinic. PMH is significant for HTN, HLD, and remote tobacco abuse. BMET in the beginning of January showed SCr increase from 1.39 to 1.95 after increasing losartan. Losartan was stopped, however SCr remained elevated. She was advised to get a renal duplex to rule out renal artery stenosis, results negative for renal artery stenosis.  Increase hydralazine or start clonidine  Smoking?  Current HTN meds: amlodipine 10mg  daily, hydralazine 10mg  TID Previously tried: losartan - SCr increase BP goal: <130/80mmHg  Family History: The patient's family history includes Heart attack (age of onset: 64) in her sister; Heart attack (age of onset: 38) in her father; Kidney failure in her mother.  Social History: Denies alcohol and illicit drug use.   Diet:   Exercise:   Home BP readings:   Wt Readings from Last 3 Encounters:  07/25/17 181 lb (82.1 kg)  12/02/16 173 lb 9.6 oz (78.7 kg)  08/02/16 172 lb (78 kg)   BP Readings from Last 3 Encounters:  07/25/17 (!) 154/88  02/17/17 (!) 186/94  12/02/16 (!) 162/88   Pulse Readings from Last 3 Encounters:  07/25/17 64  02/17/17 82  12/02/16 66    Renal function: CrCl cannot be calculated (Patient's most recent lab result is older than the maximum 21 days allowed.).  Past Medical History:  Diagnosis Date  . Back pain   . Breast pain   . Hyperlipidemia   . Hypertension   . Neck pain     Current Outpatient Medications on File Prior to Visit  Medication Sig Dispense Refill  . amLODipine (NORVASC) 10 MG tablet Take 1 tablet by mouth daily.    . Ascorbic Acid (VITAMIN C PO) Take 1 tablet by mouth daily.    Marland Kitchen aspirin EC 81 MG EC tablet Take 1 tablet (81 mg total) by mouth daily.    Marland Kitchen atorvastatin (LIPITOR) 20 MG tablet Take 20 mg by mouth daily.    . Brexpiprazole  (REXULTI) 1 MG TABS Take 1 tablet by mouth daily.    Marland Kitchen buPROPion (WELLBUTRIN SR) 100 MG 12 hr tablet Take 100 mg by mouth See admin instructions. Take 1 tablet (100 mg) by mouth twice daily - 9am and 5pm    . citalopram (CELEXA) 40 MG tablet Take 40 mg by mouth daily.    . cyclobenzaprine (FLEXERIL) 10 MG tablet Take 10 mg by mouth 2 (two) times daily.    Marland Kitchen gabapentin (NEURONTIN) 300 MG capsule Take 600 mg by mouth 3 (three) times daily.    . hydrALAZINE (APRESOLINE) 10 MG tablet TAKE 1 TABLET(10 MG) BY MOUTH THREE TIMES DAILY 270 tablet 3  . HYDROcodone-acetaminophen (NORCO) 10-325 MG tablet Take 1 tablet by mouth 3 (three) times daily as needed for severe pain.    . Lidocaine, Anorectal, 5 % CREA Apply 1 application topically as needed for pain.    . Melatonin 5 MG CAPS Take 5 mg by mouth at bedtime.    . Multiple Vitamin (MULTIVITAMIN WITH MINERALS) TABS tablet Take 1 tablet by mouth daily.    . Multiple Vitamins-Minerals (HAIR/SKIN/NAILS/BIOTIN) TABS Take 1 tablet by mouth daily.    . Probiotic  Product (PROBIOTIC PO) Take 1 tablet by mouth daily.     No current facility-administered medications on file prior to visit.     Allergies  Allergen Reactions  . Ivp Dye [Iodinated Diagnostic Agents] Nausea And Vomiting  . Penicillins Other (See Comments)    Passed out after penicillin injection at 62 years old.   . Sulfa Antibiotics Other (See Comments)    Unknown allergic reaction     Assessment/Plan:  1. Hypertension -

## 2018-08-10 NOTE — Progress Notes (Deleted)
Cardiology Office Note:    Date:  08/10/2018   ID:  Patricia Dominguez, DOB [birth year], MRN [id number]  PCP:  Patient, No Pcp Per  Cardiologist:  No primary care provider on file. *** Electrophysiologist:  None   Referring MD: No ref. provider found   No chief complaint on file. ***  History of Present Illness:    Patricia Dominguez is a 63 y.o. female with hypertension, hyperlipidemia, chest pain, family hx of CAD, tobacco use.  She was seen in 2018 at [facility] for chest pain, minimally elevated enzymes (flat trend) in the setting of AKI.  Echo demonstrated EF 60-65, normal wall motion.  She was last seen by Dr. Radford Pax in 07/2017.  ETT was scheduled but had to be canceled due to hip pain.  She did have increased creatinine with increased dose of angiotensin receptor blocker.  Renal artery [place] was neg for RAS.   ***  Ms. Coover ***  Prior CV studies:   The following studies were reviewed today:  Renal Art [place] 08/18/17 Right: Normal size right kidney. No evidence of right renal artery        stenosis. Normal right Resisitive Index. RRV flow present.        Normal cortical thickness of right kidney. Cyst, 1.2 cm x 1.2        cm, noted. Left:  Normal size of left kidney. No evidence of left renal artery        stenosis. Normal left Resistive Index. LRV flow present.        Normal cortical thickness of the left kidney. Mesenteric: Normal Celiac artery and Superior Mesenteric artery findings.  Echo 08/02/16 EF 60-65, no RWMA, normal diastolic function   Past Medical History:  Diagnosis Date  . Back pain   . Breast pain   . Hyperlipidemia   . Hypertension   . Neck pain    Surgical Hx: The patient  has no past surgical history on file.   Current Medications: No outpatient medications have been marked as taking for the 08/11/18 encounter (Appointment) with Richardson Dopp T, PA-C.     Allergies:   Ivp dye [iodinated diagnostic agents]; Penicillins; and Sulfa antibiotics   Social History     Tobacco Use  . Smoking status: Current Some Day Smoker  . Smokeless tobacco: Never Used  Substance Use Topics  . Alcohol use: No  . Drug use: No     Family Hx: The patient's family history includes Heart attack (age of onset: 71) in her sister; Heart attack (age of onset: 48) in her father; Kidney failure in her mother.  ROS:   Please see the history of present illness.    ROS All other systems reviewed and are negative.   EKGs/Labs/Other Test Reviewed:    EKG:  EKG is *** ordered today.  The ekg ordered today demonstrates ***  Recent Labs: No results found for requested labs within last 8760 hours.   Recent Lipid Panel No results found for: CHOL, TRIG, HDL, CHOLHDL, LDLCALC, LDLDIRECT  Physical Exam:    VS:  There were no vitals taken for this visit.    Wt Readings from Last 3 Encounters:  07/25/17 181 lb (82.1 kg)  12/02/16 173 lb 9.6 oz (78.7 kg)  08/02/16 172 lb (78 kg)     ***Physical Exam  ASSESSMENT & PLAN:    No diagnosis found.***  Dispo:  No follow-ups on file.   Medication Adjustments/Labs and Tests Ordered: Current medicines are reviewed at length  with the patient today.  Concerns regarding medicines are outlined above.  Tests Ordered: No orders of the defined types were placed in this encounter.  Medication Changes: No orders of the defined types were placed in this encounter.   Signed, Richardson Dopp, PA-C  08/10/2018 8:55 PM    [facility] [place], [place], [place]  [address] Phone: [phone]; Fax: [phone]

## 2018-08-11 ENCOUNTER — Ambulatory Visit: Payer: Medicare HMO | Admitting: Physician Assistant

## 2018-08-29 ENCOUNTER — Telehealth: Payer: Self-pay | Admitting: Internal Medicine

## 2018-08-29 NOTE — Telephone Encounter (Signed)
Left VM for patient  She was seen 1 year ago Unless unstable symptoms would recomm postponing visit until corona virus situation improves Will have office call to reschedule for late spring/early summer

## 2018-08-30 ENCOUNTER — Telehealth: Payer: Self-pay | Admitting: *Deleted

## 2018-08-30 ENCOUNTER — Ambulatory Visit: Payer: Medicare HMO | Admitting: Physician Assistant

## 2018-08-30 NOTE — Progress Notes (Deleted)
  Cardiology Office Note:    Date:  08/30/2018   ID:  Patricia Dominguez, DOB 02/16/1956, MRN [id number]  PCP:  Patient, No Pcp Per  Cardiologist:  No primary care provider on file. *** Electrophysiologist:  None   Referring MD: No ref. provider found   No chief complaint on file. ***  History of Present Illness:    Patricia Dominguez is a 63 y.o. female with chronic kidney disease, hypertension, hyperlipidemia, FHx of CAD, tobacco use, chest pain.  She was last seen by Dr. Radford Pax in 07/2017 for atypical chest pain.  ETT was ordered by not done.  She did have worsening renal function with increased dose of ARB. Renal artery [place] was neg for RAS.  ***  Ms. Kissel ***  Prior CV studies:   The following studies were reviewed today:  *** Renal Artery [place] 08/18/17 Right: Normal size right kidney. No evidence of right renal artery stenosis.  Left:  Normal size of left kidney. No evidence of left renal artery stenosis.  Mesenteric: Normal Celiac artery and Superior Mesenteric artery findings.  Echo 08/02/2016 EF 60-65, no RWMA, normal diastolic function  Past Medical History:  Diagnosis Date  . Back pain   . Breast pain   . Hyperlipidemia   . Hypertension   . Neck pain    Surgical Hx: The patient  has no past surgical history on file.   Current Medications: No outpatient medications have been marked as taking for the 08/30/18 encounter (Appointment) with Richardson Dopp T, PA-C.     Allergies:   Ivp dye [iodinated diagnostic agents]; Penicillins; and Sulfa antibiotics   Social History   Tobacco Use  . Smoking status: Current Some Day Smoker  . Smokeless tobacco: Never Used  Substance Use Topics  . Alcohol use: No  . Drug use: No     Family Hx: The patient's family history includes Heart attack (age of onset: 24) in her sister; Heart attack (age of onset: 41) in her father; Kidney failure in her mother.  ROS:   Please see the history of present illness.    ROS All other systems  reviewed and are negative.   EKGs/Labs/Other Test Reviewed:    EKG:  EKG is *** ordered today.  The ekg ordered today demonstrates ***  Recent Labs: No results found for requested labs within last 8760 hours.   Recent Lipid Panel No results found for: CHOL, TRIG, HDL, CHOLHDL, LDLCALC, LDLDIRECT  Physical Exam:    VS:  There were no vitals taken for this visit.    Wt Readings from Last 3 Encounters:  07/25/17 181 lb (82.1 kg)  12/02/16 173 lb 9.6 oz (78.7 kg)  08/02/16 172 lb (78 kg)     ***Physical Exam  ASSESSMENT & PLAN:    No diagnosis found.***  Dispo:  No follow-ups on file.   Medication Adjustments/Labs and Tests Ordered: Current medicines are reviewed at length with the patient today.  Concerns regarding medicines are outlined above.  Tests Ordered: No orders of the defined types were placed in this encounter.  Medication Changes: No orders of the defined types were placed in this encounter.   Signed, Richardson Dopp, PA-C  08/30/2018 9:51 AM    [facility] [place], [place], [place]  [address] Phone: [phone]; Fax: [phone]

## 2018-08-30 NOTE — Telephone Encounter (Signed)
SPOKE WITH PT TO INQUIRE ABOUT OFFICE VISIT. IT WAS  MENTIONED DR ROSS ATTEMPTED TO CALL ON 08-29-18  AND LM.  PT ADMITTED TO NOT CHECKING VM.  PT STATED SHE WAS DEALING WITH TRANSPORTATION ISSUES AND CAR PROBLEMS. PT  STATED THAT SHE HAD HER APPOINTMENT MIXED UP AND THOUGHT HER APPOINTMENT WAS TOMORROW.  PT WAS TRIAGED FOR ANY SYMPTOMS AND IS DOING FINE. IT WAS EXPLAINED ABOUT  COVID-19 PRECAUTION AND EXPLAINED SCHEDULING PROTOCOL  AND TIME FRAME.

## 2018-08-31 NOTE — Telephone Encounter (Addendum)
See duplicate phone note. Pt doing well. Will tag L3.

## 2018-09-19 ENCOUNTER — Telehealth: Payer: Self-pay | Admitting: Cardiology

## 2018-09-19 NOTE — Telephone Encounter (Signed)
09-21-18 @ 2:00  Video/[place]

## 2018-09-20 NOTE — Telephone Encounter (Signed)
TELEPHONE CALL NOTE  This patient has been deemed a candidate for follow-up tele-health visit to limit community exposure during the Covid-19 pandemic. I spoke with the patient via phone to discuss instructions. This has been outlined on the patient's AVS (dotphrase: hcevisitinfo). The patient was advised to review the section on consent for treatment as well. The patient will receive a phone call 2-3 days prior to their E-Visit at which time consent will be verbally confirmed. A Virtual Office Visit appointment type has been scheduled for 09/21/2018 at 2:00pm with Dr Radford Pax, with "VIDEO" or "TELEPHONE" in the appointment notes - patient prefers VIDEO type.  I have either confirmed the patient is active in MyChart or offered to send sign-up link to phone/email via Mychart icon beside patient's photo.Not Active  [name], CMA 09/20/2018 4:58 PM      Virtual Visit Pre-Appointment Phone Call  Patricia Dominguez has been deemed a candidate for a follow-up tele-health visit to limit community exposure during the Covid-19 pandemic. I spoke with the patient via phone to ensure availability of phone/video source, confirm preferred email & phone number, and discuss instructions and expectations.  I reminded Patricia Dominguez to be prepared with any vital sign and/or heart rhythm information that could potentially be obtained via home monitoring, at the time of her visit. I reminded Patricia Dominguez to expect a phone call at the time of her visit if her visit.  Did the patient verbally acknowledge consent to treatment? YES  Patricia Dominguez, CMA 09/20/2018 4:59 PM   DOWNLOADING THE [place]  - If Apple, go to [employer] and type in WebEx in the search bar. [place] [employer], the blue/green circle. The app is free but as with any other app downloads, their phone may require them to verify saved payment information or Apple password. The patient does NOT have to create an account.  -  If Android, ask patient to go to [employer] and type in WebEx in the search bar. [place] [employer], the blue/green circle. The app is free but as with any other app downloads, their phone may require them to verify saved payment information or Android password. The patient does NOT have to create an account.   CONSENT FOR TELE-HEALTH VISIT - PLEASE REVIEW  I hereby voluntarily request, consent and authorize [facility] and its employed or contracted physicians, physician assistants, nurse practitioners or other licensed health care professionals (the Practitioner), to provide me with telemedicine health care services (the "Services") as deemed necessary by the treating Practitioner. I acknowledge and consent to receive the Services by the Practitioner via telemedicine. I understand that the telemedicine visit will involve communicating with the Practitioner through live audiovisual communication technology and the disclosure of certain medical information by electronic transmission. I acknowledge that I have been given the opportunity to request an in-person assessment or other available alternative prior to the telemedicine visit and am voluntarily participating in the telemedicine visit.  I understand that I have the right to withhold or withdraw my consent to the use of telemedicine in the course of my care at any time, without affecting my right to future care or treatment, and that the Practitioner or I may terminate the telemedicine visit at any time. I understand that I have the right to inspect all information obtained and/or recorded in the course of the telemedicine visit and may receive copies of available information for a reasonable fee.  I understand that some of  the potential risks of receiving the Services via telemedicine include:  Marland Kitchen Delay or interruption in medical evaluation due to technological equipment failure or disruption; . Information transmitted may not be  sufficient (e.g. poor resolution of images) to allow for appropriate medical decision making by the Practitioner; and/or  . In rare instances, security protocols could fail, causing a breach of personal health information.  Furthermore, I acknowledge that it is my responsibility to provide information about my medical history, conditions and care that is complete and accurate to the best of my ability. I acknowledge that Practitioner's advice, recommendations, and/or decision may be based on factors not within their control, such as incomplete or inaccurate data provided by me or distortions of diagnostic images or specimens that may result from electronic transmissions. I understand that the practice of medicine is not an exact science and that Practitioner makes no warranties or guarantees regarding treatment outcomes. I acknowledge that I will receive a copy of this consent concurrently upon execution via email to the email address I last provided but may also request a printed copy by calling the office of [facility].    I understand that my insurance will be billed for this visit.   I have read or had this consent read to me. . I understand the contents of this consent, which adequately explains the benefits and risks of the Services being provided via telemedicine.  . I have been provided ample opportunity to ask questions regarding this consent and the Services and have had my questions answered to my satisfaction. . I give my informed consent for the services to be provided through the use of telemedicine in my medical care  By participating in this telemedicine visit I agree to the above.

## 2018-09-21 ENCOUNTER — Telehealth (INDEPENDENT_AMBULATORY_CARE_PROVIDER_SITE_OTHER): Payer: Medicare HMO | Admitting: Cardiology

## 2018-09-21 ENCOUNTER — Other Ambulatory Visit: Payer: Self-pay

## 2018-09-21 ENCOUNTER — Encounter: Payer: Self-pay | Admitting: Cardiology

## 2018-09-21 VITALS — BP 170/90 | HR 65 | Ht 67.55 in | Wt 159.0 lb

## 2018-09-21 DIAGNOSIS — R079 Chest pain, unspecified: Secondary | ICD-10-CM

## 2018-09-21 DIAGNOSIS — R002 Palpitations: Secondary | ICD-10-CM | POA: Diagnosis not present

## 2018-09-21 DIAGNOSIS — I10 Essential (primary) hypertension: Secondary | ICD-10-CM | POA: Diagnosis not present

## 2018-09-21 DIAGNOSIS — E785 Hyperlipidemia, unspecified: Secondary | ICD-10-CM

## 2018-09-21 DIAGNOSIS — Z79899 Other long term (current) drug therapy: Secondary | ICD-10-CM

## 2018-09-21 MED ORDER — CARVEDILOL 6.25 MG PO TABS: 6.2500 mg | ORAL_TABLET | Freq: Two times a day (BID) | ORAL | 3 refills | Status: DC

## 2018-09-21 MED ORDER — LOSARTAN POTASSIUM 100 MG PO TABS: 100.0000 mg | ORAL_TABLET | Freq: Every day | ORAL | 3 refills | Status: DC

## 2018-09-21 NOTE — Progress Notes (Signed)
Virtual Visit was attempted but due to technical issues with the sound this was changed to a telephone Note   This visit type was conducted due to national recommendations for restrictions regarding the COVID-19 Pandemic (e.g. social distancing) in an effort to limit this patient's exposure and mitigate transmission in our community.  Due to her co-morbid illnesses, this patient is at least at moderate risk for complications without adequate follow up.  This format is felt to be most appropriate for this patient at this time.  All issues noted in this document were discussed and addressed.  A limited physical exam was performed with this format.  Please refer to the patient's chart for her consent to telehealth for [facility].  Evaluation Performed:  Follow-up visit  This visit type was conducted due to national recommendations for restrictions regarding the COVID-19 Pandemic (e.g. social distancing).  This format is felt to be most appropriate for this patient at this time.  All issues noted in this document were discussed and addressed.  No physical exam was performed (except for noted visual exam findings with Video Visits).  Please refer to the patient's chart (MyChart message for video visits and phone note for telephone visits) for the patient's consent to telehealth for [facility].  Date:  09/21/2018   ID:  Patricia Dominguez, DOB 02/22/1956, MRN [id number]  Patient Location:  Home  Provider location:   [facility]  PCP:  Patient, No Pcp Per  Cardiologist:  Fransico Him, MD Electrophysiologist:  None   Chief Complaint:  Chest pain  History of Present Illness:    Patricia Dominguez is a 63 y.o. female who presents via audio/video conferencing for a telehealth visit today.    Patricia Dominguez is a 63 y.o. female with a hx of atypical chest pain tender to palpation over her left chest wall at [facility] in 07/2016. CRF included remote tobacco use, family history of CAD and MI at an  early age in her father and hypertension.  2-D echo 07/2016 showed normal LVEF 60-65% with no wall motion abnormality.No stress test recommended as her CP was felt to be musculoskeletal.   Statin was stopped and beta blocker was stopped because of bradycardia. She saw [name], PA back a few months later and was still have some mild CP and BP was elevated.  Her Losartan was increased to 50mg  BID and was referred to pain management.  ETT was recommended but she never followed through.   She is now here today with complaints of recurrent chest pain.  She says this pain is different from what she had before in some respects but and others is the same.  She says there are some sharp shooting pains but then she also feels like something is squeezing her heart inside.  She will notice her heart racing at times and beating irregular.  She thinks she may be having panic attacks because if she sits down and rests and takes Xanax her symptoms seem to improve.  Of concern though she is getting nauseated and sometimes vomits when she gets the chest discomfort.  She denies any radiation of the pain or diaphoresis.  Her blood pressures have not been well controlled her PCP recently increased her losartan to 100 mg twice daily.  She denies any  PND, orthopnea, LE edema, dizziness or syncope. She is compliant with her meds and is tolerating meds with no SE.    The patient does not have symptoms concerning for COVID-19 infection (fever,  chills, cough, or new shortness of breath).    Prior CV studies:   The following studies were reviewed today:  none  Past Medical History:  Diagnosis Date  . Back pain   . Breast pain   . Hyperlipidemia   . Hypertension   . Neck pain    No past surgical history on file.   Current Meds  Medication Sig  . ALPRAZolam (XANAX) 0.5 MG tablet Take 0.5 mg by mouth at bedtime as needed for anxiety.  Marland Kitchen amLODipine (NORVASC) 10 MG tablet Take 1 tablet by mouth daily.  . Ascorbic  Acid (VITAMIN C PO) Take 1 tablet by mouth daily.  Marland Kitchen aspirin EC 81 MG EC tablet Take 1 tablet (81 mg total) by mouth daily.  Marland Kitchen atorvastatin (LIPITOR) 20 MG tablet Take 20 mg by mouth daily.  . B Complex Vitamins (B COMPLEX PO) Take 1 tablet by mouth daily.  . chlorthalidone (HYGROTON) 25 MG tablet Take 25 mg by mouth daily.  . cyclobenzaprine (FLEXERIL) 10 MG tablet Take 10 mg by mouth 2 (two) times daily.  Marland Kitchen gabapentin (NEURONTIN) 300 MG capsule Take 600 mg by mouth 3 (three) times daily.  Marland Kitchen losartan (COZAAR) 50 MG tablet Take 100 mg by mouth 2 (two) times daily.  . Melatonin 5 MG CAPS Take 5 mg by mouth at bedtime.  . Multiple Vitamin (MULTIVITAMIN WITH MINERALS) TABS tablet Take 1 tablet by mouth daily.  . Multiple Vitamins-Minerals (HAIR/SKIN/NAILS/BIOTIN) TABS Take 1 tablet by mouth daily.  . Omega-3 Fatty Acids (FISH OIL PO) Take 1 capsule by mouth daily.  Marland Kitchen omeprazole (PRILOSEC) 20 MG capsule Take 20 mg by mouth daily.  Marland Kitchen oxyCODONE-acetaminophen (PERCOCET) 10-325 MG tablet TK 1 T PO Q 4 TO 6 H PRN P  . Probiotic Product (PROBIOTIC PO) Take 1 tablet by mouth daily.     Allergies:   Ivp dye [iodinated diagnostic agents]; Penicillins; and Sulfa antibiotics   Social History   Tobacco Use  . Smoking status: Current Some Day Smoker  . Smokeless tobacco: Never Used  Substance Use Topics  . Alcohol use: No  . Drug use: No     Family Hx: The patient's family history includes Heart attack (age of onset: 65) in her sister; Heart attack (age of onset: 38) in her father; Kidney failure in her mother.  ROS:   Please see the history of present illness.     All other systems reviewed and are negative.   Labs/Other Tests and Data Reviewed:    Recent Labs: No results found for requested labs within last 8760 hours.   Recent Lipid Panel No results found for: CHOL, TRIG, HDL, CHOLHDL, LDLCALC, LDLDIRECT  Wt Readings from Last 3 Encounters:  09/21/18 159 lb (72.1 kg)  07/25/17 181  lb (82.1 kg)  12/02/16 173 lb 9.6 oz (78.7 kg)     Objective:    Vital Signs:  BP (!) 170/90   Pulse 65   Ht 5' 7.55" (1.716 m)   Wt 159 lb (72.1 kg)   BMI 24.50 kg/m    CONSTITUTIONAL:  Well nourished, well developed female in no acute distress.  EYES: anicteric MOUTH: oral mucosa is pink RESPIRATORY: Normal respiratory effort, symmetric expansion CARDIOVASCULAR: No peripheral edema SKIN: No rash, lesions or ulcers MUSCULOSKELETAL: no digital cyanosis NEURO: Cranial Nerves II-XII grossly intact, moves all extremities PSYCH: Intact judgement and insight.  A&O x 3, Mood/affect appropriate   ASSESSMENT & PLAN:    1.  Chest pain  -  this has been felt to be musculoskeletal in the past.  At last OV a year ago ETT was recommended due to CRFs but this was never followed through on.  She now is having pain that she says is somewhat the same as it was several years ago but in some respects it is different.  She has some sharp shooting pain like before but she is also having a squeezing pain in the mid part of her chest that becomes unbearable.  There is no radiation of the discomfort but she does get numbness in her fingers and also will get at times nauseated and vomit.  Her cardiac risk factors include hyperlipidemia and hypertension as well as a family history of premature CAD.  She does smoke as well.  I have recommended getting a Lexiscan Myoview to rule out ischemia.  She also needs aggressive control of her blood pressure.  I will see if we can get her stress test set up for tomorrow or Monday given her symptoms.  2.  Hypertension - Her BP is elevated today.  Her PCP recently increased her losartan to 100 mg twice daily.  I do not think she will get much benefit from this increased dose.  I recommended she decrease losartan back to 100 mg daily and she will continue on Amlodipine 10mg  daily.  I am going to start carvedilol 6.25 mg twice daily and she will continue on chlorthalidone 25 mg  daily.  I have asked her to check her blood pressure 2 hours after she takes her a.m. meds daily for a week and call us with the results.  3.  Hyperlipidemia - Her PCP follows her lipids and she is on Lipitor 20mg  daily.  It does not appear that lipids have been checked in some time.  I will try to get a copy from her PCP.  4.  Palpitations -unclear if these are related to anxiety attacks or she is having an arrhythmia.  I will get a ZIO Patch to evaluate further.  COVID-19 Education: The signs and symptoms of COVID-19 were discussed with the patient and how to seek care for testing (follow up with PCP or arrange E-visit).  The importance of social distancing was discussed today.  Patient Risk:   After full review of this patient's clinical status, I feel that they are at least moderate risk at this time.  Time:   Today, I have spent 25 minutes with the patient reviewing chart and discussing medical problems including HTN, hyperlipidemia dn hx of CP and reviewing symptoms of COVID 19 and the ways to protect against contracting the virus with telehealth technology.      Medication Adjustments/Labs and Tests Ordered: Current medicines are reviewed at length with the patient today.  Concerns regarding medicines are outlined above.  Tests Ordered: No orders of the defined types were placed in this encounter.  Medication Changes: No orders of the defined types were placed in this encounter.   Disposition:  Follow up prn  Signed, Fransico Him, MD  09/21/2018 2:08 PM    [place]

## 2018-09-21 NOTE — Patient Instructions (Signed)
Medication Instructions:  Start: Carvedilol 6.25 mg, twice a day, by mouth Decrease: Losartan 100 mg, daily, by mouth If you need a refill on your cardiac medications before your next appointment, please call your pharmacy.   Lab work: None If you have labs (blood work) drawn today and your tests are completely normal, you will receive your results only by: Marland Kitchen MyChart Message (if you have MyChart) OR . A paper copy in the mail If you have any lab test that is abnormal or we need to change your treatment, we will call you to review the results.  Testing/Procedures: Your physician has requested that you have a lexiscan myoview. For further information please visit [email or web address]. Please follow instruction sheet, as given.  Your physician has recommended that you wear an Zio Patch monitor, 14 day monitor.   Follow-Up: Waiting on results.

## 2018-09-25 ENCOUNTER — Encounter: Payer: Self-pay | Admitting: *Deleted

## 2018-09-25 NOTE — Progress Notes (Signed)
Patient ID: Patricia Dominguez, female   DOB: 23-Dec-1955, 63 y.o.   MRN: [id number] Patient enrolled for [employer] to mail a 14 day ZIO XT long term holter monitor to the patients home.

## 2018-09-26 ENCOUNTER — Ambulatory Visit (HOSPITAL_COMMUNITY): Payer: Medicare HMO

## 2018-11-02 ENCOUNTER — Encounter: Payer: Self-pay | Admitting: Cardiology

## 2018-12-28 ENCOUNTER — Telehealth (HOSPITAL_COMMUNITY): Payer: Self-pay | Admitting: *Deleted

## 2018-12-28 NOTE — Telephone Encounter (Signed)
Attempted calling patient to go over instructions for an upcoming test. No answer.  Her mailbox is full and cannot accept any messages.  No email listed to send instructions.

## 2019-01-01 ENCOUNTER — Encounter (HOSPITAL_COMMUNITY): Payer: Medicare HMO

## 2019-01-16 ENCOUNTER — Telehealth (HOSPITAL_COMMUNITY): Payer: Self-pay | Admitting: *Deleted

## 2019-01-16 NOTE — Telephone Encounter (Signed)
Patient given detailed instructions per Myocardial Perfusion Study Information Sheet for the test on 01/24/19 at 1045. Patient notified to arrive 15 minutes early and that it is imperative to arrive on time for appointment to keep from having the test rescheduled.  If you need to cancel or reschedule your appointment, please call the office within 24 hours of your appointment. . Patient verbalized understanding.Jareb Radoncic, [name]

## 2019-01-18 ENCOUNTER — Encounter (HOSPITAL_COMMUNITY): Payer: Medicare HMO

## 2019-01-22 ENCOUNTER — Telehealth (HOSPITAL_COMMUNITY): Payer: Self-pay | Admitting: *Deleted

## 2019-01-22 NOTE — Telephone Encounter (Signed)
Patient given detailed instructions per Myocardial Perfusion Study Information Sheet for the test on 01/24/19. Patient notified to arrive 15 minutes early and that it is imperative to arrive on time for appointment to keep from having the test rescheduled.  If you need to cancel or reschedule your appointment, please call the office within 24 hours of your appointment. . Patient verbalized understanding. Kirstie Peri

## 2019-01-24 ENCOUNTER — Encounter (HOSPITAL_COMMUNITY): Payer: Medicare HMO

## 2019-01-31 ENCOUNTER — Encounter (HOSPITAL_COMMUNITY): Payer: Medicare HMO

## 2019-02-13 ENCOUNTER — Telehealth (HOSPITAL_COMMUNITY): Payer: Self-pay | Admitting: *Deleted

## 2019-02-13 NOTE — Telephone Encounter (Signed)
Patients voicemail box full.Left message on sister voicemail per DPR in reference to upcoming appointment scheduled for 02/21/2019. Phone number given for a call back so details instructions can be given. Patricia Dominguez, [name] No mychart

## 2019-02-20 ENCOUNTER — Telehealth (HOSPITAL_COMMUNITY): Payer: Self-pay | Admitting: *Deleted

## 2019-02-20 NOTE — Telephone Encounter (Signed)
Attempted to leave a message on voicemail but mailbox was full in reference to upcoming appointment scheduled for 02/21/2019. Phone number given for a call back so details instructions can be given. Patricia Dominguez, Patricia Dominguez  My chart password expired

## 2019-02-21 ENCOUNTER — Encounter (HOSPITAL_COMMUNITY): Payer: Medicare HMO

## 2019-02-26 ENCOUNTER — Telehealth (HOSPITAL_COMMUNITY): Payer: Self-pay

## 2019-02-26 NOTE — Telephone Encounter (Signed)
New message    Just an FYI. We have made several attempts to contact this patient including sending a letter to schedule or reschedule their MYOCARDIAL PERFUSION . We will be removing the patient from the WQ.   9.9.20 no show  8.12.20 Cancel Rsn: Patient (Patient not feeling well )  8.4.20 Cancel Rsn: Patient (conflict)  7.04.49 Cancel Rsn: Patient (Patient did not feel well)  5.21.20 mail reminder letter Becca Bayne  5.15.20 @ 8:50am both # are the same - mail box is full - Asal Teas Cancel Rsn: Patient (car trouble) 4.14.20 Cancel Rsn: Patient (car trouble) .---3--6.3.20 @ 2:15pm both # are the same - mailbox is full Joab Carden

## 2019-06-01 ENCOUNTER — Ambulatory Visit: Payer: Medicare HMO | Admitting: Internal Medicine

## 2019-06-18 ENCOUNTER — Other Ambulatory Visit: Payer: Self-pay

## 2019-06-18 ENCOUNTER — Telehealth (INDEPENDENT_AMBULATORY_CARE_PROVIDER_SITE_OTHER): Payer: Medicare HMO | Admitting: Cardiology

## 2019-06-18 VITALS — Ht 67.5 in | Wt 154.0 lb

## 2019-06-18 DIAGNOSIS — R079 Chest pain, unspecified: Secondary | ICD-10-CM

## 2019-06-18 DIAGNOSIS — R002 Palpitations: Secondary | ICD-10-CM

## 2019-06-18 DIAGNOSIS — R072 Precordial pain: Secondary | ICD-10-CM

## 2019-06-18 DIAGNOSIS — I10 Essential (primary) hypertension: Secondary | ICD-10-CM | POA: Diagnosis not present

## 2019-06-18 NOTE — Patient Instructions (Signed)
Medication Instructions:  Your physician recommends that you continue on your current medications as directed. Please refer to the Current Medication list given to you today.  *If you need a refill on your cardiac medications before your next appointment, please call your pharmacy*  Testing/Procedures: Your physician has requested that you have a lexiscan myoview. For further information please visit [email or web address]. Please follow instruction sheet, as given.  Follow-Up: At [facility], you and your health needs are our priority.  As part of our continuing mission to provide you with exceptional heart care, we have created designated Provider Care Teams.  These Care Teams include your primary Cardiologist (physician) and Advanced Practice Providers (APPs -  Physician Assistants and Nurse Practitioners) who all work together to provide you with the care you need, when you need it.  Follow-up with Dr. Radford Pax as needed.

## 2019-06-18 NOTE — Progress Notes (Signed)
Virtual Visit via Telephone Note   This visit type was conducted due to national recommendations for restrictions regarding the COVID-19 Pandemic (e.g. social distancing) in an effort to limit this patient's exposure and mitigate transmission in our community.  Due to her co-morbid illnesses, this patient is at least at moderate risk for complications without adequate follow up.  This format is felt to be most appropriate for this patient at this time.  The patient did not have access to video technology/had technical difficulties with video requiring transitioning to audio format only (telephone).  All issues noted in this document were discussed and addressed.  No physical exam could be performed with this format.  Please refer to the patient's chart for her  consent to telehealth for [facility].  Evaluation Performed:  Follow-up visit  This visit type was conducted due to national recommendations for restrictions regarding the COVID-19 Pandemic (e.g. social distancing).  This format is felt to be most appropriate for this patient at this time.  All issues noted in this document were discussed and addressed.  No physical exam was performed (except for noted visual exam findings with Video Visits).  Please refer to the patient's chart (MyChart message for video visits and phone note for telephone visits) for the patient's consent to telehealth for [facility].  Date:  06/18/2019   ID:  Patricia Dominguez, DOB 1955-07-28, MRN [id number]  Patient Location:  Home  Provider location:   [facility]  PCP:  Patient, No Pcp Per  Cardiologist:  Fransico Him, MD  Electrophysiologist:  None   Chief Complaint:  Chest pain  History of Present Illness:    Patricia Dominguez is a 64 y.o. female who presents via audio/video conferencing for a telehealth visit today.    Patricia Dominguez a 64 y.o.femalewith a hx of atypical chest pain tender to palpation over her left chest wall that initially started in  2018.CRF included remote tobacco use, family history of CAD and MI at an early age in her father and hypertension.  2-D echo 07/2016 showed normal LVEF 60-65% with no wall motion abnormality.No stress test recommendedas her CP was felt to be musculoskeletal.Statin was stopped and beta blocker was stopped because of bradycardia. She saw [name], PA back a few months later and was still have some mild CP and BP was elevated. Her Losartan was increased to 50mg  BID and was referred to pain management. ETT was recommended but she never followed through.   She was seen back for virtual visit in April 2020 with complaints of recurrent chest pain.  The pain was different from what she had before in some respects but and others is the same.  There was some sharp shooting pains but then she also felt like something was squeezing her heart inside.  She would notice her heart racing at times and beating irregular.  She thhought she may be having panic attacks because if she would sit down and rests and take Xanax her symptoms seem to improve.  Of concern though she was getting nauseated and sometimes vomited with the chest discomfort.  She denied any radiation of the pain or diaphoresis.  Her blood pressures had not been well controlled as well.  A Lexiscan myoview was ordered but patient never followed through.    She is here for virtual visit today and is still having chest pain that it is sporadic and she is very concerned something is wrong with her heart.  She cancelled her nuclear stress test  several times due to concern over getting COVID.  She wore a Ziopatch a few months ago for palpitations but we never go the report.  She denies any LE edema, syncope, PND, orthopnea.    The patient does not have symptoms concerning for COVID-19 infection (fever, chills, cough, or new shortness of breath).    Prior CV studies:   The following studies were reviewed today:  2D echo  Past Medical History:   Diagnosis Date  . Back pain   . Breast pain   . Hyperlipidemia   . Hypertension   . Neck pain    No past surgical history on file.   Current Meds  Medication Sig  . ALPRAZolam (XANAX) 0.5 MG tablet Take 0.5 mg by mouth at bedtime as needed for anxiety.  Marland Kitchen amLODipine (NORVASC) 10 MG tablet Take 1 tablet by mouth daily.  . Ascorbic Acid (VITAMIN C PO) Take 1 tablet by mouth daily.  Marland Kitchen aspirin EC 81 MG EC tablet Take 1 tablet (81 mg total) by mouth daily.  Marland Kitchen atorvastatin (LIPITOR) 20 MG tablet Take 20 mg by mouth daily.  . B Complex Vitamins (B COMPLEX PO) Take 1 tablet by mouth daily.  . carvedilol (COREG) 6.25 MG tablet Take 1 tablet (6.25 mg total) by mouth 2 (two) times daily.  Marland Kitchen gabapentin (NEURONTIN) 300 MG capsule Take 600 mg by mouth 3 (three) times daily.  . Melatonin 5 MG CAPS Take 5 mg by mouth at bedtime.  . Multiple Vitamin (MULTIVITAMIN WITH MINERALS) TABS tablet Take 1 tablet by mouth daily.  . Multiple Vitamins-Minerals (HAIR/SKIN/NAILS/BIOTIN) TABS Take 1 tablet by mouth daily.  . Omega-3 Fatty Acids (FISH OIL PO) Take 1 capsule by mouth daily.  Marland Kitchen omeprazole (PRILOSEC) 20 MG capsule Take 20 mg by mouth daily.  Marland Kitchen oxyCODONE-acetaminophen (PERCOCET) 10-325 MG tablet TK 1 T PO Q 4 TO 6 H PRN P  . Probiotic Product (PROBIOTIC PO) Take 1 tablet by mouth daily.     Allergies:   Ivp dye [iodinated diagnostic agents], Penicillins, and Sulfa antibiotics   Social History   Tobacco Use  . Smoking status: Current Some Day Smoker  . Smokeless tobacco: Never Used  Substance Use Topics  . Alcohol use: No  . Drug use: No     Family Hx: The patient's family history includes Heart attack (age of onset: 23) in her sister; Heart attack (age of onset: 54) in her father; Kidney failure in her mother.  ROS:   Please see the history of present illness.     All other systems reviewed and are negative.   Labs/Other Tests and Data Reviewed:    Recent Labs: No results found for  requested labs within last 8760 hours.   Recent Lipid Panel No results found for: CHOL, TRIG, HDL, CHOLHDL, LDLCALC, LDLDIRECT  Wt Readings from Last 3 Encounters:  06/18/19 154 lb (69.9 kg)  09/21/18 159 lb (72.1 kg)  07/25/17 181 lb (82.1 kg)     Objective:    Vital Signs:  Ht 5' 7.5" (1.715 m)   Wt 154 lb (69.9 kg)   BMI 23.76 kg/m     ASSESSMENT & PLAN:    1.  Chest pain -this has been atypical and unfortunately she has cancelled her stress test several times due to fear of COVID -she is in agreement with proceeding with Kirtland Bouchard so will rescehdule.  2.  HTN -followed by her PCP -her BP has been erratic and can be has high  as 325QDI systolic -continue amlodipine 10mg  daily, Carvedilol 6.25mg  BID, chlorthalidone 25mg  daily, Losartan 100mg  daily -she is not compliant with a los Na diet which is likely to contributing to poor control -I instructed her to limit her Na intake to < 2gm daily  -I will send her a paper on low and high Na foods  3.  Palpitations -these are sporadic -will try to track down the report on her Ziopatch that she wore a few months ago  COVID-19 Education: The signs and symptoms of COVID-19 were discussed with the patient and how to seek care for testing (follow up with PCP or arrange E-visit).  The importance of social distancing was discussed today.  Patient Risk:   After full review of this patient's clinical status, I feel that they are at least moderate risk at this time.  Time:   Today, I have spent 20 minutes directly with the patient on telemedicine discussing medical problems including Chest pain, HTN, palpitations.  We also reviewed the symptoms of COVID 19 and the ways to protect against contracting the virus with telehealth technology.  I spent an additional 5 minutes reviewing patient's chart including 2D echo.  Medication Adjustments/Labs and Tests Ordered: Current medicines are reviewed at length with the patient today.   Concerns regarding medicines are outlined above.  Tests Ordered: No orders of the defined types were placed in this encounter.  Medication Changes: No orders of the defined types were placed in this encounter.   Disposition:  Follow up prn  Signed, Fransico Him, MD  06/18/2019 12:07 PM    [place]

## 2019-06-19 ENCOUNTER — Telehealth: Payer: Self-pay

## 2019-06-19 ENCOUNTER — Encounter: Payer: Self-pay | Admitting: *Deleted

## 2019-06-19 DIAGNOSIS — R002 Palpitations: Secondary | ICD-10-CM

## 2019-06-19 NOTE — Telephone Encounter (Signed)
Spoke with patient to let her know we were reordering a 14 Zio patch for her.

## 2019-06-19 NOTE — Progress Notes (Signed)
Patient ID: Patricia Dominguez, female   DOB: [birth year], 64 y.o.   MRN: [id number] Patient enrolled for 14 day ZIO XT long term holter monitor to be mailed to her home.  Instructions included in monitor kit.

## 2019-06-26 ENCOUNTER — Telehealth (HOSPITAL_COMMUNITY): Payer: Self-pay

## 2019-06-26 NOTE — Telephone Encounter (Signed)
Spoke with the patient, she asked me to leave it on the cell phone. Instructions left on the patient's cell phone. Asked to call back with any questions. S.Zyquan Crotty EMTP

## 2019-06-28 ENCOUNTER — Encounter (HOSPITAL_COMMUNITY): Payer: Medicare HMO

## 2019-07-10 ENCOUNTER — Telehealth (HOSPITAL_COMMUNITY): Payer: Self-pay | Admitting: *Deleted

## 2019-07-10 NOTE — Telephone Encounter (Signed)
Patient's voice mail was full. Sent an email with instructions for stress test scheduled for 07/13/19 at 10:30.

## 2019-07-13 ENCOUNTER — Encounter (HOSPITAL_COMMUNITY): Payer: Medicare HMO

## 2019-07-27 ENCOUNTER — Ambulatory Visit: Payer: Medicare HMO | Admitting: Cardiology

## 2019-08-09 ENCOUNTER — Other Ambulatory Visit: Payer: Self-pay

## 2019-08-09 MED ORDER — LOSARTAN POTASSIUM 100 MG PO TABS: 100.0000 mg | ORAL_TABLET | Freq: Every day | ORAL | 3 refills | Status: DC

## 2019-08-27 ENCOUNTER — Other Ambulatory Visit: Payer: Self-pay | Admitting: Cardiology

## 2020-03-10 NOTE — Progress Notes (Signed)
Cardiology Office Note    Date:  03/12/2020   ID:  Patricia Dominguez, DOB 07-Jun-1956, MRN [id number]  PCP:  [name], DO  Cardiologist: Fransico Him, MD EPS: None  Chief Complaint  Patient presents with  . Follow-up    History of Present Illness:  Patricia Dominguez is a 64 y.o. female with history of atypical chest pain tender to palpation, tobacco abuse, family history of CAD, hypertension.  Echo 2018 normal LVEF stopped because of bradycardia.   Patient had a virtual visit with Dr. Radford Pax 06/18/2019 at which time she ordered a Lexiscan but was never done because of Covid.   BP  was also high and felt secondary to excessive sodium intake.  Patient comes in for f/u accompanied by a friend. Was diagnosed with stage 4 kidney disease and lupus. Has cut back to 1 cigarette/day. Has occasional palpitations with her stress, lasts 10-15 min eases with deep breathing. No regular exercise. No recent chest pain. Was referred to renal in [place] and to [facility] for her lupus. Wants to consolidate .  Wants a new PCP in [place].    Past Medical History:  Diagnosis Date  . Back pain   . Breast pain   . Hyperlipidemia   . Hypertension   . Neck pain     No past surgical history on file.  Current Medications: Current Meds  Medication Sig  . ALPRAZolam (XANAX) 0.5 MG tablet Take 0.5 mg by mouth at bedtime as needed for anxiety.  Marland Kitchen amLODipine (NORVASC) 10 MG tablet Take 1 tablet by mouth daily.  . Ascorbic Acid (VITAMIN C PO) Take 1 tablet by mouth daily.  Marland Kitchen aspirin EC 81 MG EC tablet Take 1 tablet (81 mg total) by mouth daily.  Marland Kitchen atorvastatin (LIPITOR) 20 MG tablet Take 20 mg by mouth daily.  . B Complex Vitamins (B COMPLEX PO) Take 1 tablet by mouth daily.  . carvedilol (COREG) 6.25 MG tablet TAKE 1 TABLET(6.25 MG) BY MOUTH TWICE DAILY  . ferrous sulfate 325 (65 FE) MG tablet Take 1 tablet by mouth daily.  Marland Kitchen gabapentin (NEURONTIN) 300 MG capsule Take 600 mg by mouth 3 (three) times  daily.  . Multiple Vitamin (MULTIVITAMIN WITH MINERALS) TABS tablet Take 1 tablet by mouth daily.  . Multiple Vitamins-Minerals (HAIR/SKIN/NAILS/BIOTIN) TABS Take 1 tablet by mouth daily.  Marland Kitchen oxyCODONE-acetaminophen (PERCOCET) 10-325 MG tablet TK 1 T PO Q 4 TO 6 H PRN P  . Probiotic Product (PROBIOTIC PO) Take 1 tablet by mouth daily.     Allergies:   Ivp dye [iodinated diagnostic agents], Penicillins, and Sulfa antibiotics   Social History   Socioeconomic History  . Marital status: Legally Separated    Spouse name: Not on file  . Number of children: Not on file  . Years of education: Not on file  . Highest education level: Not on file  Occupational History  . Not on file  Tobacco Use  . Smoking status: Current Some Day Smoker  . Smokeless tobacco: Never Used  Vaping Use  . Vaping Use: Never used  Substance and Sexual Activity  . Alcohol use: No  . Drug use: No  . Sexual activity: Not on file  Other Topics Concern  . Not on file  Social History Narrative  . Not on file   Social Determinants of Health   Financial Resource Strain:   . Difficulty of Paying Living Expenses: Not on file  Food Insecurity:   . Worried About [employer] of  Food in the Last Year: Not on file  . Ran Out of Food in the Last Year: Not on file  Transportation Needs:   . Lack of Transportation (Medical): Not on file  . Lack of Transportation (Non-Medical): Not on file  Physical Activity:   . Days of Exercise per Week: Not on file  . Minutes of Exercise per Session: Not on file  Stress:   . Feeling of Stress : Not on file  Social Connections:   . Frequency of Communication with Friends and Family: Not on file  . Frequency of Social Gatherings with Friends and Family: Not on file  . Attends Religious Services: Not on file  . Active Member of Clubs or Organizations: Not on file  . Attends Archivist Meetings: Not on file  . Marital Status: Not on file     Family History:  The  patient's family history includes Heart attack (age of onset: 58) in her sister; Heart attack (age of onset: 26) in her father; Kidney failure in her mother.   ROS:   Please see the history of present illness.    ROS All other systems reviewed and are negative.   PHYSICAL EXAM:   VS:  BP (!) 126/50   Pulse (!) 56   Ht 5' 7.5" (1.715 m)   Wt 149 lb 12.8 oz (67.9 kg)   SpO2 98%   BMI 23.12 kg/m   Physical Exam  GEN: Well nourished, well developed, in no acute distress  Neck: Enlarged thyroid possible goiter mild bilateral carotid bruits no JVD, or masses Cardiac:RRR; no murmurs, rubs, or gallops  Respiratory:  clear to auscultation bilaterally, normal work of breathing GI: soft, nontender, nondistended, + BS Ext: without cyanosis, clubbing, or edema, Good distal pulses bilaterally Neuro:  Alert and Oriented x 3 Psych: euthymic mood, full affect  Wt Readings from Last 3 Encounters:  03/12/20 149 lb 12.8 oz (67.9 kg)  06/18/19 154 lb (69.9 kg)  09/21/18 159 lb (72.1 kg)      Studies/Labs Reviewed:   EKG:  EKG is  ordered today.  The ekg ordered today demonstrates normal sinus rhythm, no acute change  Recent Labs: No results found for requested labs within last 8760 hours.   Lipid Panel No results found for: CHOL, TRIG, HDL, CHOLHDL, VLDL, LDLCALC, LDLDIRECT  Additional studies/ records that were reviewed today include:   2-D echo 07/2016 Study Conclusions   - Left ventricle: The cavity size was normal. Wall thickness was   normal. Systolic function was normal. The estimated ejection   fraction was in the range of 60% to 65%. Wall motion was normal;   there were no regional wall motion abnormalities. Left   ventricular diastolic function parameters were normal. - Atrial septum: No defect or patent foramen ovale was identified.        ASSESSMENT:    1. History of chest pain   2. Palpitations   3. Family history of early CAD   47. CKD (chronic kidney disease)  stage 4, GFR 15-29 ml/min (HCC)   5. Essential hypertension   6. Bilateral carotid bruits   7. Enlarged thyroid   8. Lupus ([place])   9. Tobacco abuse      PLAN:  In order of problems listed above:  Chest pain has been atypical in the past.  Has never had stress test done because of Covid concerns.  Really not an issue right now with other ongoing problems I do not believe she  needs to have it done right now.  Palpitations on occasion mostly related to stress on Coreg  Family history of CAD  CKD stage IV recently diagnosed.  Was sent to [facility] to kidney specialist but would like to be seen in [place].  We will put referral through.  Her friend had labs on her phone showing patient's creatinine was 3.03 02/20/2020.  No longer on losartan  Essential hypertension controlled with amlodipine and carvedilol.  Patient is also requesting a new PCP in [place].  Bilateral carotid bruits we will check carotid Dopplers  Enlarged thyroid feels like goiter.  Has never had a work-up done.  Will check thyroid studies and refer to endocrine.  Lupus a new diagnosis was going to go to [facility] to see a specialist but wants to be seen in [place].  We will put referral through.  Tobacco abuse smoking 1 cigarette daily trying to quit  Medication Adjustments/Labs and Tests Ordered: Current medicines are reviewed at length with the patient today.  Concerns regarding medicines are outlined above.  Medication changes, Labs and Tests ordered today are listed in the Patient Instructions below. Patient Instructions  Medication Instructions:  Your physician recommends that you continue on your current medications as directed. Please refer to the Current Medication list given to you today.  *If you need a refill on your cardiac medications before your next appointment, please call your pharmacy*   Lab Work: TODAY: TSH, FREE T3, FREE T4  If you have labs (blood work) drawn today and your tests are  completely normal, you will receive your results only by: Marland Kitchen MyChart Message (if you have MyChart) OR . A paper copy in the mail If you have any lab test that is abnormal or we need to change your treatment, we will call you to review the results.   Testing/Procedures: Your physician has requested that you have a carotid duplex. This test is an ultrasound of the carotid arteries in your neck. It looks at blood flow through these arteries that supply the brain with blood. Allow one hour for this exam. There are no restrictions or special instructions.     Follow-Up: At [facility], you and your health needs are our priority.  As part of our continuing mission to provide you with exceptional heart care, we have created designated Provider Care Teams.  These Care Teams include your primary Cardiologist (physician) and Advanced Practice Providers (APPs -  Physician Assistants and Nurse Practitioners) who all work together to provide you with the care you need, when you need it.  We recommend signing up for the patient portal called "MyChart".  Sign up information is provided on this After Visit Summary.  MyChart is used to connect with patients for Virtual Visits (Telemedicine).  Patients are able to view lab/test results, encounter notes, upcoming appointments, etc.  Non-urgent messages can be sent to your provider as well.   To learn more about what you can do with MyChart, go to [email or web address].    Your next appointment:   6 month(s)  The format for your next appointment:   In Person  Provider:   You may see Fransico Him, MD or one of the following Advanced Practice Providers on your designated Care Team:    Melina Copa, PA-C  Ermalinda Barrios, PA-C    Other Instructions You have been referred to several Dr's. You will be contacted by that office to schedule an appointment PCP - Dr Jerilee Hoh @ [facility] [phone].Marland KitchenMarland KitchenI will call to get  you an appointment.      Sumner Boast, PA-C  03/12/2020 11:23 AM    [facility] [address], [place], [place]  [address] Phone: [phone]; Fax: [phone]

## 2020-03-12 ENCOUNTER — Ambulatory Visit (INDEPENDENT_AMBULATORY_CARE_PROVIDER_SITE_OTHER): Payer: Medicare Other | Admitting: Physician Assistant

## 2020-03-12 ENCOUNTER — Other Ambulatory Visit: Payer: Self-pay

## 2020-03-12 ENCOUNTER — Encounter: Payer: Self-pay | Admitting: Physician Assistant

## 2020-03-12 VITALS — BP 126/50 | HR 56 | Ht 67.5 in | Wt 149.8 lb

## 2020-03-12 DIAGNOSIS — R0989 Other specified symptoms and signs involving the circulatory and respiratory systems: Secondary | ICD-10-CM

## 2020-03-12 DIAGNOSIS — R002 Palpitations: Secondary | ICD-10-CM

## 2020-03-12 DIAGNOSIS — Z87898 Personal history of other specified conditions: Secondary | ICD-10-CM

## 2020-03-12 DIAGNOSIS — E049 Nontoxic goiter, unspecified: Secondary | ICD-10-CM

## 2020-03-12 DIAGNOSIS — N184 Chronic kidney disease, stage 4 (severe): Secondary | ICD-10-CM | POA: Diagnosis not present

## 2020-03-12 DIAGNOSIS — Z8249 Family history of ischemic heart disease and other diseases of the circulatory system: Secondary | ICD-10-CM | POA: Diagnosis not present

## 2020-03-12 DIAGNOSIS — M329 Systemic lupus erythematosus, unspecified: Secondary | ICD-10-CM

## 2020-03-12 DIAGNOSIS — I10 Essential (primary) hypertension: Secondary | ICD-10-CM

## 2020-03-12 DIAGNOSIS — Z720 Tobacco use: Secondary | ICD-10-CM

## 2020-03-12 LAB — TSH: TSH: 1.72 u[IU]/mL (ref 0.450–4.500)

## 2020-03-12 LAB — T4, FREE: Free T4: 1.8 ng/dL — ABNORMAL HIGH (ref 0.82–1.77)

## 2020-03-12 LAB — T3, FREE: T3, Free: 3.6 pg/mL (ref 2.0–4.4)

## 2020-03-12 NOTE — Patient Instructions (Signed)
Medication Instructions:  Your physician recommends that you continue on your current medications as directed. Please refer to the Current Medication list given to you today.  *If you need a refill on your cardiac medications before your next appointment, please call your pharmacy*   Lab Work: TODAY: TSH, FREE T3, FREE T4  If you have labs (blood work) drawn today and your tests are completely normal, you will receive your results only by:  [place] (if you have MyChart) OR  A paper copy in the mail If you have any lab test that is abnormal or we need to change your treatment, we will call you to review the results.   Testing/Procedures: Your physician has requested that you have a carotid duplex. This test is an ultrasound of the carotid arteries in your neck. It looks at blood flow through these arteries that supply the brain with blood. Allow one hour for this exam. There are no restrictions or special instructions.     Follow-Up: At [facility], you and your health needs are our priority.  As part of our continuing mission to provide you with exceptional heart care, we have created designated Provider Care Teams.  These Care Teams include your primary Cardiologist (physician) and Advanced Practice Providers (APPs -  Physician Assistants and Nurse Practitioners) who all work together to provide you with the care you need, when you need it.  We recommend signing up for the patient portal called "MyChart".  Sign up information is provided on this After Visit Summary.  MyChart is used to connect with patients for Virtual Visits (Telemedicine).  Patients are able to view lab/test results, encounter notes, upcoming appointments, etc.  Non-urgent messages can be sent to your provider as well.   To learn more about what you can do with MyChart, go to [email or web address].    Your next appointment:   6 month(s)  The format for your next appointment:   In Person  Provider:    You may see Fransico Him, MD or one of the following Advanced Practice Providers on your designated Care Team:    Melina Copa, PA-C  Ermalinda Barrios, PA-C    Other Instructions You have been referred to several Dr's. You will be contacted by that office to schedule an appointment PCP - Dr Jerilee Hoh @ [facility] [phone].Marland KitchenMarland KitchenI will call to get you an appointment.

## 2020-03-14 ENCOUNTER — Encounter: Payer: Self-pay | Admitting: Internal Medicine

## 2020-03-14 ENCOUNTER — Telehealth: Payer: Self-pay | Admitting: Cardiology

## 2020-03-14 NOTE — Telephone Encounter (Signed)
Patricia Dominguez is calling due to reaching out to Dr. Etheleen Nicks office to schedule the referral and them advising they have not received it. I call the office while having the pt otp and confirmed they have not received it. She states they do not have proficient and the referral would have to be sent through [place] of faxed. The fax number for their office is [phone]. The pt is requesting a call once the referral has been sent. Please advise.

## 2020-03-14 NOTE — Telephone Encounter (Signed)
Left message for patient letting her know that I have faxed over the referral to Dr. Etheleen Nicks office. Advised to call back with any further questions.

## 2020-03-18 NOTE — Progress Notes (Signed)
Office Visit Note  Patient: Patricia Dominguez             Date of Birth: [birth year]           MRN: [id number]             PCP: [name], DO Referring: Imogene Burn, PA-C Visit Date: 03/25/2020 Occupation: @GUAROCC @  Subjective:  Positive ANA   History of Present Illness: Patricia Dominguez is a 64 y.o. female seen in consultation per request of her cardiologist.  According to the patient in 2002 she had an accident at work after that she developed injury to her neck and her lower back.  She was diagnosed with degenerative disc disease.  She has been under care of her pain management clinic.  She states she has been on pain medications and also had several injections in her back.  Over the years she has taken anti-inflammatories and Goody powders for pain management.  She states recently she had labs done at the pain management clinic which showed low GFR.  She was referred to a nephrologist in [place] who did further labs and discussed a future appointment for possible renal transplant.  She has not had a renal biopsy.  He also did some lab work which showed positive ANA for that reason she was referred to me.  She denies any history of oral ulcers, nasal ulcers, dry eyes, inflammatory arthritis, rash, photosensitivity.  She gives history of dry eyes and Raynaud's phenomenon.  There is no history of lymphadenopathy.  There is positive family history of lupus in her mother and her daughter.  She is seeing a cardiologist, Dr. Radford Pax for hypertension and palpitations.  Blood pressure medications were recently adjusted.  He is gravida 2, para 1, abortion 1.  There is no history of any blood clots or pulmonary embolism.  Activities of Daily Living:  Patient reports morning stiffness for 1 hour.   Patient Reports nocturnal pain.  Difficulty dressing/grooming: Denies Difficulty climbing stairs: Reports Difficulty getting out of chair: Reports Difficulty using hands for taps, buttons,  cutlery, and/or writing: Reports  Review of Systems  Constitutional: Positive for fatigue. Negative for night sweats, weight gain and weight loss.  HENT: Positive for mouth dryness. Negative for mouth sores, trouble swallowing, trouble swallowing and nose dryness.   Eyes: Negative for pain, redness, visual disturbance and dryness.  Respiratory: Negative for cough, shortness of breath and difficulty breathing.   Cardiovascular: Positive for swelling in legs/feet. Negative for chest pain, palpitations, hypertension and irregular heartbeat.  Gastrointestinal: Positive for constipation. Negative for blood in stool and diarrhea.  Endocrine: Positive for cold intolerance, heat intolerance and excessive thirst. Negative for increased urination.  Genitourinary: Negative for difficulty urinating and vaginal dryness.  Musculoskeletal: Positive for arthralgias, joint pain, morning stiffness and muscle tenderness. Negative for gait problem, joint swelling, myalgias, muscle weakness and myalgias.       Back pain  Skin: Positive for color change. Negative for rash, hair loss, skin tightness, ulcers and sensitivity to sunlight.  Allergic/Immunologic: Negative for susceptible to infections.  Neurological: Positive for numbness and weakness. Negative for dizziness, memory loss and night sweats.  Hematological: Negative for bruising/bleeding tendency and swollen glands.  Psychiatric/Behavioral: Positive for depressed mood and sleep disturbance. The patient is nervous/anxious.     PMFS History:  Patient Active Problem List   Diagnosis Date Noted  . Palpitations 09/21/2018  . Hyperlipidemia LDL goal <70 07/12/2017  . Goiter 08/02/2016  . Chest pain 08/01/2016  .  Chronic back pain 08/01/2016  . Depression with anxiety 08/01/2016  . Benign essential HTN 08/01/2016  . Kidney disease 08/01/2016    Past Medical History:  Diagnosis Date  . Back pain   . Breast pain   . Chronic kidney disease   .  Hyperlipidemia   . Hypertension   . Neck pain   . Systemic lupus erythematosus (HCC)     Family History  Problem Relation Age of Onset  . Kidney failure Mother   . Heart attack Father 29  . Heart attack Sister 66   Past Surgical History:  Procedure Laterality Date  . ABDOMINAL HYSTERECTOMY    . arm surgery Left   . CERVICAL SPINE SURGERY     Social History   Social History Narrative  . Not on file   Immunization History  Administered Date(s) Administered  . Moderna SARS-COVID-2 Vaccination 08/26/2019, 09/23/2019     Objective: Vital Signs: BP (!) 126/59 (BP Location: Right Arm, Patient Position: Sitting, Cuff Size: Normal)   Pulse 60   Resp 16   Ht 5' 7.5" (1.715 m)   Wt 146 lb 3.2 oz (66.3 kg)   BMI 22.56 kg/m    Physical Exam Vitals and nursing note reviewed.  Constitutional:      Appearance: She is well-developed.  HENT:     Head: Normocephalic and atraumatic.  Eyes:     Conjunctiva/sclera: Conjunctivae normal.  Cardiovascular:     Rate and Rhythm: Normal rate and regular rhythm.     Heart sounds: Normal heart sounds.  Pulmonary:     Effort: Pulmonary effort is normal.     Breath sounds: Normal breath sounds.  Abdominal:     General: Bowel sounds are normal.     Palpations: Abdomen is soft.  Musculoskeletal:     Cervical back: Normal range of motion.  Lymphadenopathy:     Cervical: No cervical adenopathy.  Skin:    General: Skin is warm and dry.     Capillary Refill: Capillary refill takes less than 2 seconds.  Neurological:     Mental Status: She is alert and oriented to person, place, and time.  Psychiatric:        Behavior: Behavior normal.      Musculoskeletal Exam: She has limited range of motion of cervical and lumbar spine.  Shoulder joints, elbow joints, wrist joints, MCPs PIPs and DIPs with good range of motion.  She is contracture in her right third PIP joint due to previous injury.  She is limited painful range of motion of her hip  joints.  Knee joints, ankles, MTPs and PIPs with good range of motion with no synovitis.  CDAI Exam: CDAI Score: -- Patient Global: --; Provider Global: -- Swollen: --; Tender: -- Joint Exam 03/25/2020   No joint exam has been documented for this visit   There is currently no information documented on the homunculus. Go to the Rheumatology activity and complete the homunculus joint exam.  Investigation: No additional findings.  Imaging: VAS US CAROTID  Result Date: 03/20/2020 Carotid Arterial Duplex Study Indications:       Bilateral bruits and patient c/o dizziness, right shoulder                    pain and numbness and 2 syncope episodes about 3-4 months                    ago. She denies any other cerebrovascular symptoms. Risk Factors:  Hypertension, hyperlipidemia, current smoker. Comparison Study:  NA Performing Technologist: Sharlett Iles RVT  Examination Guidelines: A complete evaluation includes B-mode imaging, spectral Doppler, color Doppler, and power Doppler as needed of all accessible portions of each vessel. Bilateral testing is considered an integral part of a complete examination. Limited examinations for reoccurring indications may be performed as noted.  Right Carotid Findings: +----------+--------+--------+--------+------------------+--------+           PSV cm/sEDV cm/sStenosisPlaque DescriptionComments +----------+--------+--------+--------+------------------+--------+ CCA Prox  106     18                                         +----------+--------+--------+--------+------------------+--------+ CCA Distal95      23                                         +----------+--------+--------+--------+------------------+--------+ ICA Prox  165     40      40-59%  heterogenous               +----------+--------+--------+--------+------------------+--------+ ICA Mid   144     38                                          +----------+--------+--------+--------+------------------+--------+ ICA Distal158     45                                         +----------+--------+--------+--------+------------------+--------+ ECA       142     13                                         +----------+--------+--------+--------+------------------+--------+ +----------+--------+-------+---------+-------------------+           PSV cm/sEDV cmsDescribe Arm Pressure (mmHG) +----------+--------+-------+---------+-------------------+ Subclavian208            Turbulent180                 +----------+--------+-------+---------+-------------------+ +---------+--------+--+--------+--+---------------------------------+ VertebralPSV cm/s93EDV cm/s18Antegrade and Atypical; turbulent +---------+--------+--+--------+--+---------------------------------+  Left Carotid Findings: +----------+--------+--------+--------+------------------+------------------+           PSV cm/sEDV cm/sStenosisPlaque DescriptionComments           +----------+--------+--------+--------+------------------+------------------+ CCA Prox  132     23                                                   +----------+--------+--------+--------+------------------+------------------+ CCA Distal126     28                                intimal thickening +----------+--------+--------+--------+------------------+------------------+ ICA Prox  114     25              heterogenous                         +----------+--------+--------+--------+------------------+------------------+ ICA  Mid   131     30      1-39%                     tortuous           +----------+--------+--------+--------+------------------+------------------+ ICA Distal108     34                                tortuous           +----------+--------+--------+--------+------------------+------------------+ ECA       139     23              heterogenous                          +----------+--------+--------+--------+------------------+------------------+ +----------+--------+--------+----------------+-------------------+           PSV cm/sEDV cm/sDescribe        Arm Pressure (mmHG) +----------+--------+--------+----------------+-------------------+ [id number]             Multiphasic, [id number]                 +----------+--------+--------+----------------+-------------------+ +---------+--------+--+--------+--+---------+ VertebralPSV cm/s58EDV cm/s18Antegrade +---------+--------+--+--------+--+---------+   Summary: Right Carotid: Velocities in the right ICA are consistent with a 40-59%                stenosis. Left Carotid: Velocities in the left ICA are consistent with a 1-39% stenosis. Vertebrals:  Left vertebral artery demonstrates antegrade flow. Atypical              antegrade flow in the right vertebral artery; turbulent flow. Subclavians: Right subclavian artery flow was disturbed. Right innominate flow              was disturbed. Normal flow hemodynamics were seen in the left              subclavian artery. *See table(s) above for measurements and observations. Suggest follow up study in 12 months. Electronically signed by Quay Burow MD on 03/20/2020 at 4:58:06 PM.    Final     Recent Labs: Lab Results  Component Value Date   WBC 12.3 (H) 08/01/2016   HGB 11.8 (L) 08/01/2016   PLT 326 08/01/2016   NA 142 07/28/2017   K 5.3 (H) 07/28/2017   CL 105 07/28/2017   CO2 18 (L) 07/28/2017   GLUCOSE 85 07/28/2017   BUN 49 (H) 07/28/2017   CREATININE 1.98 (H) 07/28/2017   BILITOT 0.2 (L) 08/02/2016   ALKPHOS 70 08/02/2016   AST 21 08/02/2016   ALT 21 08/02/2016   PROT 6.6 08/02/2016   ALBUMIN 3.6 08/02/2016   CALCIUM 9.3 07/28/2017   GFRAA 31 (L) 07/28/2017    Speciality Comments: No specialty comments available.  Procedures:  No procedures performed Allergies: Ivp dye [iodinated diagnostic agents], Iodine, Penicillins, and  Sulfa antibiotics   Assessment / Plan:     Visit Diagnoses: Positive ANA (antinuclear antibody) - 01/17/20: ANA 1:1280, dsDNA-, C3/C4 WNL, ANCA negative, protein/creatinine ratio is elevated, Hep B-, Hep C- -I will obtain additional labs today to complete the work-up.  Patient is concerned that she may have lupus as there is strong family history of lupus.  She states her mother and daughter both have lupus.  Plan: CBC with Differential/Platelet, COMPLETE METABOLIC PANEL WITH GFR, Urinalysis, Routine w reflex microscopic, CK, Sedimentation rate, ANA, Anti-scleroderma antibody, RNP Antibody, Anti-Smith antibody, Sjogrens syndrome-A extractable nuclear antibody,  Sjogrens syndrome-B extractable nuclear antibody, Anti-DNA antibody, double-stranded, C3 and C4, Beta-2 glycoprotein antibodies, Cardiolipin antibodies, IgG, IgM, IgA, Lupus Anticoagulant Eval w/Reflex, Protein / creatinine ratio, urine  Family history of lupus erythematosus - mother and daughter  Chronic pain of both hips -she had very limited painful range of motion of her hip joints.  Plan: XR HIPS BILAT W OR W/O PELVIS 3-4 VIEWS  DDD cervical-patient complains of chronic pain and discomfort in her cervical spine since her injury at work.  She goes to pain management.  DDD lumbar-she had painful limited range of motion of lumbar spine.  She states since her injury at work she has chronic severe lower back pain.  Chronic pain syndrome-she has been going to pain management for many years.  High risk medication use -in anticipation to start her knee treatment in the future often following labs today.  Plan: Glucose 6 phosphate dehydrogenase, Thiopurine methyltransferase(tpmt)rbc, Serum protein electrophoresis with reflex, IgG, IgA, IgM, QuantiFERON-TB Gold Plus, HIV Antibody (routine testing w rflx)  Benign essential HTN-blood pressure is quite well controlled currently.  Palpitations-she relates it to anxiety.  Goiter-she had palpable  goiter.  Hyperlipidemia LDL goal <70  History of type 2 diabetes mellitus  CKD (chronic kidney disease) stage 4, GFR 15-29 ml/min (HCC)-the etiology of chronic kidney disease at this point is uncertain.  Patient did not have a kidney biopsy.  She is awaiting an appointment from the nephrologist here in [place].  There is history of long-term use of NSAIDs.  She is also diabetic.  Her labs showed positive ANA.  Depression with anxiety-she has had chronic depression and anxiety.  Educated about COVID-19 virus infection-she is fully vaccinated against COVID-19.  She was advised to get a booster once available to her.  Use of mask, social distancing and hand hygiene was discussed.  She will be candidate for monoclonal antibody infusion in case she develops COVID-19 infection.  Orders: Orders Placed This Encounter  Procedures  . XR HIPS BILAT W OR W/O PELVIS 3-4 VIEWS  . CBC with Differential/Platelet  . COMPLETE METABOLIC PANEL WITH GFR  . Urinalysis, Routine w reflex microscopic  . CK  . Sedimentation rate  . ANA  . Anti-scleroderma antibody  . RNP Antibody  . Anti-Smith antibody  . Sjogrens syndrome-A extractable nuclear antibody  . Sjogrens syndrome-B extractable nuclear antibody  . Anti-DNA antibody, double-stranded  . C3 and C4  . Beta-2 glycoprotein antibodies  . Cardiolipin antibodies, IgG, IgM, IgA  . Lupus Anticoagulant Eval w/Reflex  . Glucose 6 phosphate dehydrogenase  . Thiopurine methyltransferase(tpmt)rbc  . Serum protein electrophoresis with reflex  . IgG, IgA, IgM  . QuantiFERON-TB Gold Plus  . HIV Antibody (routine testing w rflx)  . Protein / creatinine ratio, urine   No orders of the defined types were placed in this encounter.    Follow-Up Instructions: Return for Positive ANA.   Bo Merino, MD  Note - This record has been created using Editor, commissioning.  Chart creation errors have been sought, but may not always  have been located. Such  creation errors do not reflect on  the standard of medical care.

## 2020-03-20 ENCOUNTER — Other Ambulatory Visit (HOSPITAL_COMMUNITY): Payer: Self-pay | Admitting: Physician Assistant

## 2020-03-20 ENCOUNTER — Other Ambulatory Visit: Payer: Self-pay

## 2020-03-20 ENCOUNTER — Ambulatory Visit (HOSPITAL_COMMUNITY)
Admission: RE | Admit: 2020-03-20 | Discharge: 2020-03-20 | Disposition: A | Discharge status: Home / Self Care | Payer: Medicare Other | Source: Ambulatory Visit | Attending: Cardiology | Admitting: Cardiology

## 2020-03-20 DIAGNOSIS — R0989 Other specified symptoms and signs involving the circulatory and respiratory systems: Secondary | ICD-10-CM | POA: Insufficient documentation

## 2020-03-20 DIAGNOSIS — I6523 Occlusion and stenosis of bilateral carotid arteries: Secondary | ICD-10-CM

## 2020-03-25 ENCOUNTER — Ambulatory Visit: Payer: Self-pay

## 2020-03-25 ENCOUNTER — Other Ambulatory Visit: Payer: Self-pay

## 2020-03-25 ENCOUNTER — Telehealth: Payer: Self-pay

## 2020-03-25 ENCOUNTER — Ambulatory Visit (INDEPENDENT_AMBULATORY_CARE_PROVIDER_SITE_OTHER): Payer: Medicare Other | Admitting: Rheumatology

## 2020-03-25 ENCOUNTER — Telehealth: Payer: Self-pay | Admitting: Rheumatology

## 2020-03-25 ENCOUNTER — Encounter: Payer: Self-pay | Admitting: Rheumatology

## 2020-03-25 VITALS — BP 126/59 | HR 60 | Resp 16 | Ht 67.5 in | Wt 146.2 lb

## 2020-03-25 DIAGNOSIS — E049 Nontoxic goiter, unspecified: Secondary | ICD-10-CM

## 2020-03-25 DIAGNOSIS — E785 Hyperlipidemia, unspecified: Secondary | ICD-10-CM

## 2020-03-25 DIAGNOSIS — M25552 Pain in left hip: Secondary | ICD-10-CM | POA: Diagnosis not present

## 2020-03-25 DIAGNOSIS — M25551 Pain in right hip: Secondary | ICD-10-CM

## 2020-03-25 DIAGNOSIS — R768 Other specified abnormal immunological findings in serum: Secondary | ICD-10-CM | POA: Diagnosis not present

## 2020-03-25 DIAGNOSIS — M5136 Other intervertebral disc degeneration, lumbar region: Secondary | ICD-10-CM

## 2020-03-25 DIAGNOSIS — Z79899 Other long term (current) drug therapy: Secondary | ICD-10-CM

## 2020-03-25 DIAGNOSIS — M51369 Other intervertebral disc degeneration, lumbar region without mention of lumbar back pain or lower extremity pain: Secondary | ICD-10-CM

## 2020-03-25 DIAGNOSIS — Z840 Family history of diseases of the skin and subcutaneous tissue: Secondary | ICD-10-CM | POA: Diagnosis not present

## 2020-03-25 DIAGNOSIS — R002 Palpitations: Secondary | ICD-10-CM

## 2020-03-25 DIAGNOSIS — Z7189 Other specified counseling: Secondary | ICD-10-CM

## 2020-03-25 DIAGNOSIS — G8929 Other chronic pain: Secondary | ICD-10-CM

## 2020-03-25 DIAGNOSIS — M5030 Other cervical disc degeneration, unspecified cervical region: Secondary | ICD-10-CM

## 2020-03-25 DIAGNOSIS — Z8639 Personal history of other endocrine, nutritional and metabolic disease: Secondary | ICD-10-CM

## 2020-03-25 DIAGNOSIS — I10 Essential (primary) hypertension: Secondary | ICD-10-CM

## 2020-03-25 DIAGNOSIS — N184 Chronic kidney disease, stage 4 (severe): Secondary | ICD-10-CM

## 2020-03-25 DIAGNOSIS — G894 Chronic pain syndrome: Secondary | ICD-10-CM

## 2020-03-25 DIAGNOSIS — R7689 Other specified abnormal immunological findings in serum: Secondary | ICD-10-CM

## 2020-03-25 DIAGNOSIS — F418 Other specified anxiety disorders: Secondary | ICD-10-CM

## 2020-03-25 LAB — CBC WITH DIFFERENTIAL/PLATELET
MCV: 70 fL — ABNORMAL LOW (ref 80.0–100.0)
Monocytes Relative: 10.8 %
Neutro Abs: 3600 cells/uL (ref 1500–7800)
RBC: 5.63 10*6/uL — ABNORMAL HIGH (ref 3.80–5.10)
RDW: 17.3 % — ABNORMAL HIGH (ref 11.0–15.0)
WBC: 7.5 10*3/uL (ref 3.8–10.8)

## 2020-03-25 NOTE — Patient Instructions (Signed)
COVID-19 vaccine recommendations:   COVID-19 vaccine is recommended for everyone (unless you are allergic to a vaccine component), even if you are on a medication that suppresses your immune system.   Do not take Tylenol or any anti-inflammatory medications (NSAIDs) 24 hours prior to the COVID-19 vaccination.   There is no direct evidence about the efficacy of the COVID-19 vaccine in individuals who are on medications that suppress the immune system.   Even if you are fully vaccinated, and you are on any medications that suppress your immune system, please continue to wear a mask, maintain at least six feet social distance and practice hand hygiene.   If you develop a COVID-19 infection, please contact your PCP or our office to determine if you need antibody infusion.  The booster vaccine is now available for immunocompromised patients. It is advised that if you had Pfizer vaccine you should get Pfizer booster.  If you had a Moderna vaccine then you should get a Moderna booster. Johnson and Johnson does not have a booster vaccine at this time.  Please see the following web sites for updated information.   [email or web address]://[email or web address]/[email or web address]-Vaccination-Patient-Resources.[email or web address]  [email or web address]://[email or web address]/About-[email or web address]-Releases/ID/1159  

## 2020-03-25 NOTE — Telephone Encounter (Signed)
Patient calling back.   °

## 2020-03-25 NOTE — Telephone Encounter (Signed)
We received a fax from [facility] stating that she needs current labs in order for the Dr to be able to review for the referral. The pt had labs done today at Dr Arlean Hopping office. I contacted the pt and gave her the fax # to [facility]. She will call and have the labs faxed to them.

## 2020-03-25 NOTE — Telephone Encounter (Signed)
Patient requesting a copy of labs results to be sent to her Nephrologist once results come in. Fax # [phone] ph [phone]. Patient's first appointment to be scheduled once they get lab results.

## 2020-03-26 LAB — COMPLETE METABOLIC PANEL WITH GFR: Sodium: 139 mmol/L (ref 135–146)

## 2020-03-26 LAB — URINALYSIS, ROUTINE W REFLEX MICROSCOPIC
Bilirubin Urine: NEGATIVE
Hyaline Cast: NONE SEEN /LPF
RBC / HPF: NONE SEEN /HPF (ref 0–2)

## 2020-03-26 LAB — CBC WITH DIFFERENTIAL/PLATELET
Basophils Relative: 0.9 %
Eosinophils Relative: 2.8 %
HCT: 39.4 % (ref 35.0–45.0)

## 2020-03-27 LAB — COMPLETE METABOLIC PANEL WITH GFR
BUN/Creatinine Ratio: 19 (calc) (ref 6–22)
Glucose, Bld: 96 mg/dL (ref 65–99)

## 2020-03-27 LAB — URINALYSIS, ROUTINE W REFLEX MICROSCOPIC
Ketones, ur: NEGATIVE
pH: 6.5 (ref 5.0–8.0)

## 2020-03-27 LAB — CBC WITH DIFFERENTIAL/PLATELET

## 2020-03-28 LAB — ANTI-SMITH ANTIBODY: ENA SM Ab Ser-aCnc: <1 AI

## 2020-03-28 LAB — CBC WITH DIFFERENTIAL/PLATELET
Eosinophils Absolute: 210 cells/uL (ref 15–500)
MPV: 10.2 fL (ref 7.5–12.5)

## 2020-03-28 LAB — PROTEIN ELECTROPHORESIS, SERUM, WITH REFLEX
Alpha 2: 0.8 g/dL (ref 0.5–0.9)
Beta Globulin: 0.5 g/dL (ref 0.4–0.6)

## 2020-03-28 NOTE — Telephone Encounter (Signed)
Labs faxed to [facility]

## 2020-03-29 NOTE — Progress Notes (Signed)
UA showed trace proteinuria.  Patient has low GFR.  CK is mildly elevated.  ANA is positive.  ENA is completely negative.  Patient had no clinical features of autoimmune disease on my examination.  Please send all the labs to nephrologist.  She needs evaluation by nephrologist as soon as possible to establish the diagnosis.

## 2020-04-01 LAB — COMPLETE METABOLIC PANEL WITH GFR
AG Ratio: 1.5 (calc) (ref 1.0–2.5)
ALT: 21 U/L (ref 6–29)
AST: 24 U/L (ref 10–35)
Albumin: 4.9 g/dL (ref 3.6–5.1)
Alkaline phosphatase (APISO): 69 U/L (ref 37–153)
BUN: 58 mg/dL — ABNORMAL HIGH (ref 7–25)
CO2: 25 mmol/L (ref 20–32)
Calcium: 10.2 mg/dL (ref 8.6–10.4)
Chloride: 99 mmol/L (ref 98–110)
Creat: 2.98 mg/dL — ABNORMAL HIGH (ref 0.50–0.99)
GFR, Est African American: 19 mL/min/{1.73_m2} — ABNORMAL LOW (ref >=60)
GFR, Est Non African American: 16 mL/min/{1.73_m2} — ABNORMAL LOW (ref >=60)
Globulin: 3.3 g/dL (calc) (ref 1.9–3.7)
Potassium: 4.9 mmol/L (ref 3.5–5.3)
Total Bilirubin: 0.4 mg/dL (ref 0.2–1.2)
Total Protein: 8.2 g/dL — ABNORMAL HIGH (ref 6.1–8.1)

## 2020-04-01 LAB — CBC WITH DIFFERENTIAL/PLATELET
Absolute Monocytes: 810 cells/uL (ref 200–950)
Basophils Absolute: 68 cells/uL (ref 0–200)
Hemoglobin: 11.9 g/dL (ref 11.7–15.5)
Lymphs Abs: 2813 cells/uL (ref 850–3900)
MCH: 21.1 pg — ABNORMAL LOW (ref 27.0–33.0)
MCHC: 30.2 g/dL — ABNORMAL LOW (ref 32.0–36.0)
Neutrophils Relative %: 48 %
Platelets: 341 10*3/uL (ref 140–400)
Total Lymphocyte: 37.5 %

## 2020-04-01 LAB — HIV ANTIBODY (ROUTINE TESTING W REFLEX): HIV 1&2 Ab, 4th Generation: NONREACTIVE

## 2020-04-01 LAB — GLUCOSE 6 PHOSPHATE DEHYDROGENASE: G-6PDH: 21 U/g Hgb — ABNORMAL HIGH (ref 7.0–20.5)

## 2020-04-01 LAB — C3 AND C4
C3 Complement: 122 mg/dL (ref 83–193)
C4 Complement: 24 mg/dL (ref 15–57)

## 2020-04-01 LAB — SEDIMENTATION RATE: Sed Rate: 17 mm/h (ref 0–30)

## 2020-04-01 LAB — QUANTIFERON-TB GOLD PLUS
Mitogen-NIL: >10 IU/mL
NIL: 0.03 IU/mL
QuantiFERON-TB Gold Plus: NEGATIVE
TB1-NIL: 0 IU/mL
TB2-NIL: <0 IU/mL

## 2020-04-01 LAB — PROTEIN ELECTROPHORESIS, SERUM, WITH REFLEX
Albumin ELP: 4.8 g/dL (ref 3.8–4.8)
Alpha 1: 0.3 g/dL (ref 0.2–0.3)
Beta 2: 0.3 g/dL (ref 0.2–0.5)
Gamma Globulin: 1.5 g/dL (ref 0.8–1.7)
Total Protein: 8.2 g/dL — ABNORMAL HIGH (ref 6.1–8.1)

## 2020-04-01 LAB — SJOGRENS SYNDROME-B EXTRACTABLE NUCLEAR ANTIBODY: SSB (La) (ENA) Antibody, IgG: <1 AI

## 2020-04-01 LAB — URINALYSIS, ROUTINE W REFLEX MICROSCOPIC
Bacteria, UA: NONE SEEN /HPF
Glucose, UA: NEGATIVE
Hgb urine dipstick: NEGATIVE
Nitrite: NEGATIVE
Specific Gravity, Urine: 1.009 (ref 1.001–1.03)

## 2020-04-01 LAB — ANTI-NUCLEAR AB-TITER (ANA TITER)
ANA TITER: 1:80 {titer} — ABNORMAL HIGH
ANA TITER: 1:80 {titer} — ABNORMAL HIGH
ANA Titer 1: 1:1280 {titer} — ABNORMAL HIGH

## 2020-04-01 LAB — CARDIOLIPIN ANTIBODIES, IGG, IGM, IGA
Anticardiolipin IgA: <2 APL-U/mL
Anticardiolipin IgG: <2 GPL-U/mL
Anticardiolipin IgM: 3.5 MPL-U/mL

## 2020-04-01 LAB — BETA-2 GLYCOPROTEIN ANTIBODIES
Beta-2 Glyco 1 IgA: <2 U/mL
Beta-2 Glyco 1 IgM: 3.5 U/mL
Beta-2 Glyco I IgG: <2 U/mL

## 2020-04-01 LAB — ANA: Anti Nuclear Antibody (ANA): POSITIVE — AB

## 2020-04-01 LAB — PROTEIN / CREATININE RATIO, URINE
Creatinine, Urine: 45 mg/dL (ref 20–275)
Protein/Creat Ratio: 467 mg/g creat — ABNORMAL HIGH (ref 21–161)
Protein/Creatinine Ratio: 0.467 mg/mg creat — ABNORMAL HIGH (ref 0.021–0.16)
Total Protein, Urine: 21 mg/dL (ref 5–24)

## 2020-04-01 LAB — IFE INTERPRETATION

## 2020-04-01 LAB — SJOGRENS SYNDROME-A EXTRACTABLE NUCLEAR ANTIBODY: SSA (Ro) (ENA) Antibody, IgG: <1 AI

## 2020-04-01 LAB — ANTI-SCLERODERMA ANTIBODY: Scleroderma (Scl-70) (ENA) Antibody, IgG: <1 AI

## 2020-04-01 LAB — IGG, IGA, IGM
IgG (Immunoglobin G), Serum: 1663 mg/dL — ABNORMAL HIGH (ref 600–1540)
IgM, Serum: 170 mg/dL (ref 50–300)
Immunoglobulin A: 192 mg/dL (ref 70–320)

## 2020-04-01 LAB — RNP ANTIBODY: Ribonucleic Protein(ENA) Antibody, IgG: <1 AI

## 2020-04-01 LAB — CK: Total CK: 267 U/L — ABNORMAL HIGH (ref 29–143)

## 2020-04-01 LAB — THIOPURINE METHYLTRANSFERASE (TPMT), RBC: Thiopurine Methyltransferase, RBC: 10 nmol/hr/mL RBC — ABNORMAL LOW

## 2020-04-01 LAB — LUPUS ANTICOAGULANT EVAL W/ REFLEX
PTT-LA Screen: 37 s (ref <=40)
dRVVT: 37 s (ref <=45)

## 2020-04-01 LAB — ANTI-DNA ANTIBODY, DOUBLE-STRANDED: ds DNA Ab: <1 IU/mL

## 2020-04-03 ENCOUNTER — Telehealth: Payer: Self-pay | Admitting: Rheumatology

## 2020-04-03 NOTE — Telephone Encounter (Signed)
We have not established a diagnosis of lupus.  She will have to see the nephrologist to establish the diagnosis.  It most likely will require a renal biopsy.  She can either start work-up here or she can start work-up and plan.  Her health needs urgent attention.

## 2020-04-03 NOTE — Telephone Encounter (Signed)
Patient wanted to know if doctor can give her anything to help with Lupus. Patient knows she has a follow up to discuss this on 04/16/2020. Per patient, she is trying to do anything she can to be able to stay living here in this area. Her daughter is wanting to move her to [place]. Please call patient to advise.

## 2020-04-03 NOTE — Telephone Encounter (Signed)
Patient advised we have not established a diagnosis of lupus.  She will have to see the nephrologist to establish the diagnosis.  It most likely will require a renal biopsy.  She can either start work-up here or she can start work-up and plan.  Her health needs urgent attention. Patient has an appointment with nephrologist next week.

## 2020-04-06 NOTE — Progress Notes (Signed)
Office Visit Note  Patient: Patricia Dominguez             Date of Birth: [birth year]           MRN: [id number]             PCP: [name], DO Referring: [name], DO Visit Date: 04/16/2020 Occupation: _0 @  Subjective:  Positive ANA and kidney disease.   History of Present Illness: Patricia Dominguez is a 64 y.o. female with history of positive ANA and low GFR.  She denies any history of oral ulcers, nasal ulcers, no rash, photosensitivity, Raynaud's, inflammatory arthritis or lymphadenopathy.  She was recently evaluated by Dr. Royce Macadamia for low GFR and proteinuria.  The work-up is still pending.    Activities of Daily Living:  Patient reports morning stiffness for several hours.   Patient Reports nocturnal pain.  Difficulty dressing/grooming: Reports Difficulty climbing stairs: Reports Difficulty getting out of chair: Reports Difficulty using hands for taps, buttons, cutlery, and/or writing: Reports  Review of Systems  Constitutional: Negative for fatigue.  HENT: Negative for mouth sores, mouth dryness and nose dryness.   Eyes: Negative for pain, itching and dryness.  Respiratory: Negative for shortness of breath and difficulty breathing.   Cardiovascular: Negative for chest pain and palpitations.  Gastrointestinal: Positive for constipation. Negative for blood in stool and diarrhea.  Endocrine: Negative for increased urination.  Genitourinary: Negative for difficulty urinating and painful urination.  Musculoskeletal: Positive for arthralgias, joint pain, myalgias, morning stiffness, muscle tenderness and myalgias. Negative for joint swelling.  Skin: Positive for color change. Negative for rash and redness.  Allergic/Immunologic: Negative for susceptible to infections.  Neurological: Positive for numbness and headaches. Negative for dizziness and weakness.  Hematological: Positive for bruising/bleeding tendency.  Psychiatric/Behavioral: Positive for sleep disturbance.  The patient is nervous/anxious.     PMFS History:  Patient Active Problem List   Diagnosis Date Noted  . Palpitations 09/21/2018  . Hyperlipidemia LDL goal <70 07/12/2017  . Goiter 08/02/2016  . Chest pain 08/01/2016  . Chronic back pain 08/01/2016  . Depression with anxiety 08/01/2016  . Benign essential HTN 08/01/2016  . Kidney disease 08/01/2016    Past Medical History:  Diagnosis Date  . Back pain   . Breast pain   . Chronic kidney disease   . Hyperlipidemia   . Hypertension   . Neck pain   . Systemic lupus erythematosus (HCC)     Family History  Problem Relation Age of Onset  . Kidney failure Mother   . Heart attack Father 68  . Heart attack Sister 68   Past Surgical History:  Procedure Laterality Date  . ABDOMINAL HYSTERECTOMY    . arm surgery Left   . CERVICAL SPINE SURGERY     Social History   Social History Narrative  . Not on file   Immunization History  Administered Date(s) Administered  . Moderna SARS-COVID-2 Vaccination 08/26/2019, 09/23/2019     Objective: Vital Signs: BP 133/73 (BP Location: Left Arm, Patient Position: Sitting, Cuff Size: Normal)   Pulse 64   Resp 13   Ht 5' 7.5" (1.715 m)   Wt 147 lb 12.8 oz (67 kg)   BMI 22.81 kg/m    Physical Exam Vitals and nursing note reviewed.  Constitutional:      Appearance: She is well-developed.  HENT:     Head: Normocephalic and atraumatic.  Eyes:     Conjunctiva/sclera: Conjunctivae normal.  Cardiovascular:     Rate and Rhythm: Normal  rate and regular rhythm.     Heart sounds: Normal heart sounds.  Pulmonary:     Effort: Pulmonary effort is normal.     Breath sounds: Normal breath sounds.  Abdominal:     General: Bowel sounds are normal.     Palpations: Abdomen is soft.  Musculoskeletal:     Cervical back: Normal range of motion.  Lymphadenopathy:     Cervical: No cervical adenopathy.  Skin:    General: Skin is warm and dry.     Capillary Refill: Capillary refill takes less  than 2 seconds.  Neurological:     Mental Status: She is alert and oriented to person, place, and time.  Psychiatric:        Behavior: Behavior normal.      Musculoskeletal Exam: She had discomfort range of motion of her cervical and lumbar spine.  Shoulder joints, elbow joints, wrist joints with good range of motion.  She has contracture in bilateral third PIP.  She has limited range of motion of her hip joints.  CDAI Exam: CDAI Score: -- Patient Global: --; Provider Global: -- Swollen: --; Tender: -- Joint Exam 04/16/2020   No joint exam has been documented for this visit   There is currently no information documented on the homunculus. Go to the Rheumatology activity and complete the homunculus joint exam.  Investigation: No additional findings.  Imaging: US RENAL  Result Date: 04/16/2020 CLINICAL DATA:  Initial evaluation for chronic kidney disease stage 4. EXAM: RENAL / URINARY TRACT ULTRASOUND COMPLETE COMPARISON:  None available. FINDINGS: Right Kidney: Renal measurements: 11.3 x 3.7 x 4.6 cm = volume: 103 mL. Increased echogenicity seen within the renal parenchyma with poor corticomedullary differentiation. No nephrolithiasis or hydronephrosis. 1 cm simple anechoic cyst present at the upper pole. Left Kidney: Renal measurements: 9.5 x 5.5 x 4.6 cm = volume: 128 mL. Increased echogenicity within the renal parenchyma with poor corticomedullary differentiation. No nephrolithiasis or hydronephrosis. 1.7 x 1.2 x 2.1 cm mildly complex cystic lesion with scattered low-level internal echoes seen within the interpolar region. No appreciable internal vascularity or solid component. Bladder: Appears normal for degree of bladder distention. Neither ureteral jet is visualized. Other: None. IMPRESSION: 1. Increased echogenicity within the renal parenchyma, compatible with medical renal disease. No hydronephrosis. 2. 2.1 cm mildly complex cystic lesion at the interpolar left kidney, indeterminate.  While this is favored to be benign, further assessment with dedicated renal mass protocol CT and/or MRI recommended for complete characterization. 3. 1 cm simple right renal cyst. Electronically Signed   By: Jeannine Boga M.D.   On: 04/16/2020 02:52   XR HIPS BILAT W OR W/O PELVIS 3-4 VIEWS  Result Date: 03/25/2020 No SI joint changes were noted.  Right inferior medial joint space narrowing and sclerosis was noted.  Left hip joint was within normal limits. Impression: These findings are consistent with osteoarthritis of the right hip joint.  VAS US CAROTID  Result Date: 03/20/2020 Carotid Arterial Duplex Study Indications:       Bilateral bruits and patient c/o dizziness, right shoulder                    pain and numbness and 2 syncope episodes about 3-4 months                    ago. She denies any other cerebrovascular symptoms. Risk Factors:      Hypertension, hyperlipidemia, current smoker. Comparison Study:  NA Performing Technologist: Sharlett Iles  RVT  Examination Guidelines: A complete evaluation includes B-mode imaging, spectral Doppler, color Doppler, and power Doppler as needed of all accessible portions of each vessel. Bilateral testing is considered an integral part of a complete examination. Limited examinations for reoccurring indications may be performed as noted.  Right Carotid Findings: +----------+--------+--------+--------+------------------+--------+           PSV cm/sEDV cm/sStenosisPlaque DescriptionComments +----------+--------+--------+--------+------------------+--------+ CCA Prox  106     18                                         +----------+--------+--------+--------+------------------+--------+ CCA Distal95      23                                         +----------+--------+--------+--------+------------------+--------+ ICA Prox  165     40      40-59%  heterogenous                +----------+--------+--------+--------+------------------+--------+ ICA Mid   144     38                                         +----------+--------+--------+--------+------------------+--------+ ICA Distal158     45                                         +----------+--------+--------+--------+------------------+--------+ ECA       142     13                                         +----------+--------+--------+--------+------------------+--------+ +----------+--------+-------+---------+-------------------+           PSV cm/sEDV cmsDescribe Arm Pressure (mmHG) +----------+--------+-------+---------+-------------------+ Subclavian208            Turbulent180                 +----------+--------+-------+---------+-------------------+ +---------+--------+--+--------+--+---------------------------------+ VertebralPSV cm/s93EDV cm/s18Antegrade and Atypical; turbulent +---------+--------+--+--------+--+---------------------------------+  Left Carotid Findings: +----------+--------+--------+--------+------------------+------------------+           PSV cm/sEDV cm/sStenosisPlaque DescriptionComments           +----------+--------+--------+--------+------------------+------------------+ CCA Prox  132     23                                                   +----------+--------+--------+--------+------------------+------------------+ CCA Distal126     28                                intimal thickening +----------+--------+--------+--------+------------------+------------------+ ICA Prox  114     25              heterogenous                         +----------+--------+--------+--------+------------------+------------------+ ICA Mid   131     30  1-39%                     tortuous           +----------+--------+--------+--------+------------------+------------------+ ICA Distal108     34                                tortuous            +----------+--------+--------+--------+------------------+------------------+ ECA       139     23              heterogenous                         +----------+--------+--------+--------+------------------+------------------+ +----------+--------+--------+----------------+-------------------+           PSV cm/sEDV cm/sDescribe        Arm Pressure (mmHG) +----------+--------+--------+----------------+-------------------+ [id number]             Multiphasic, VEL381                 +----------+--------+--------+----------------+-------------------+ +---------+--------+--+--------+--+---------+ VertebralPSV cm/s58EDV cm/s18Antegrade +---------+--------+--+--------+--+---------+   Summary: Right Carotid: Velocities in the right ICA are consistent with a 40-59%                stenosis. Left Carotid: Velocities in the left ICA are consistent with a 1-39% stenosis. Vertebrals:  Left vertebral artery demonstrates antegrade flow. Atypical              antegrade flow in the right vertebral artery; turbulent flow. Subclavians: Right subclavian artery flow was disturbed. Right innominate flow              was disturbed. Normal flow hemodynamics were seen in the left              subclavian artery. *See table(s) above for measurements and observations. Suggest follow up study in 12 months. Electronically signed by Quay Burow MD on 03/20/2020 at 4:58:06 PM.    Final     Recent Labs: Lab Results  Component Value Date   WBC 7.5 03/25/2020   HGB 11.9 03/25/2020   PLT 341 03/25/2020   NA 139 03/25/2020   K 4.9 03/25/2020   CL 99 03/25/2020   CO2 25 03/25/2020   GLUCOSE 96 03/25/2020   BUN 58 (H) 03/25/2020   CREATININE 2.98 (H) 03/25/2020   BILITOT 0.4 03/25/2020   ALKPHOS 70 08/02/2016   AST 24 03/25/2020   ALT 21 03/25/2020   PROT 8.2 (H) 03/25/2020   PROT 8.2 (H) 03/25/2020   ALBUMIN 3.6 08/02/2016   CALCIUM 10.2 03/25/2020   GFRAA 19 (L) 03/25/2020   QFTBGOLDPLUS NEGATIVE  03/25/2020  March 25, 2020 UA showed trace protein, 1+ leukocytes, protein creatinine ratio 467, IFE showed a poorly defined area of restricted protein mobility detected and is reactive with kappa light chain antisera,  IgG 1663 elevated, TB Gold negative, lupus anticoagulant negative, beta-2 GP 1 -, anticardiolipin negative, ANA negative, ENA negative, C3-C4 normal, ESR 17, CK 267, G6PD 21(H), TPMT 10(L), HIV negative   01/17/20: ANA 1:1280, dsDNA-, C3/C4 WNL, ANCA negative, protein/creatinine ratio is elevated, Hep B-, Hep C-  April 10, 2020 labs from Dr. Luis Abed office showed phosphorus 4.5, GFR 25, UA showed trace protein.    Speciality Comments: No specialty comments available.  Procedures:  No procedures performed Allergies: Ivp dye [iodinated diagnostic agents], Iodine, Penicillins, and Sulfa antibiotics   Assessment / Plan:  Visit Diagnoses: Positive ANA (antinuclear antibody) - ANA 1: 1280, ENA negative, C3-C4 normal, she had no clinical features of lupus.  All the lab results were discussed with patient at length.  Family history of lupus erythematosus - Mother and daughter  CKD (chronic kidney disease) stage 4, GFR 15-29 ml/min (HCC) -she was evaluated by Dr. Royce Macadamia at [facility].  Further work-up is pending.  There is history of long-term use of NSAIDs and she is diabetic.  Chronic pain of both hips - Right knee joint osteoarthritis was noted.  Left knee joint x-ray was unremarkable.  I have given her a handout on hip joint exercises that she has limited range of motion and it causes discomfort.  DDD (degenerative disc disease), cervical - She has had discomfort since an injury in the past.  She has limited painful range of motion.  DDD (degenerative disc disease), lumbar - Related to work-related injury in the past.  She has limited painful range of motion.  Chronic pain syndrome - She is followed at pain management.  Benign essential HTN-blood  pressure was normal today.  Hyperlipidemia LDL goal <70  Palpitations  Abnormal SPEP-she has abnormal SPEP and Epps normal IFE which could be due to renal disease.  I will refer her to hematology for evaluation.  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 Ambulatory referral to Hematology   No orders of the defined types were placed in this encounter.     Follow-Up Instructions: Return in about 2 months (around 06/16/2020) for +ANA.   Bo Merino, MD  Note - This record has been created using Editor, commissioning.  Chart creation errors have been sought, but may not always  have been located. Such creation errors do not reflect on  the standard of medical care.

## 2020-04-15 ENCOUNTER — Other Ambulatory Visit: Payer: Medicare Other

## 2020-04-15 ENCOUNTER — Other Ambulatory Visit: Payer: Self-pay | Admitting: Nephrology

## 2020-04-15 ENCOUNTER — Ambulatory Visit
Admission: RE | Admit: 2020-04-15 | Discharge: 2020-04-15 | Disposition: A | Discharge status: Home / Self Care | Payer: Medicare Other | Source: Ambulatory Visit | Attending: Nephrology | Admitting: Nephrology

## 2020-04-15 DIAGNOSIS — N184 Chronic kidney disease, stage 4 (severe): Secondary | ICD-10-CM

## 2020-04-16 ENCOUNTER — Encounter: Payer: Self-pay | Admitting: Rheumatology

## 2020-04-16 ENCOUNTER — Other Ambulatory Visit: Payer: Self-pay

## 2020-04-16 ENCOUNTER — Ambulatory Visit: Payer: Medicare Other | Admitting: Rheumatology

## 2020-04-16 ENCOUNTER — Other Ambulatory Visit: Payer: Self-pay | Admitting: Nephrology

## 2020-04-16 VITALS — BP 133/73 | HR 64 | Resp 13 | Ht 67.5 in | Wt 147.8 lb

## 2020-04-16 DIAGNOSIS — R768 Other specified abnormal immunological findings in serum: Secondary | ICD-10-CM

## 2020-04-16 DIAGNOSIS — M5136 Other intervertebral disc degeneration, lumbar region: Secondary | ICD-10-CM

## 2020-04-16 DIAGNOSIS — Z840 Family history of diseases of the skin and subcutaneous tissue: Secondary | ICD-10-CM

## 2020-04-16 DIAGNOSIS — R899 Unspecified abnormal finding in specimens from other organs, systems and tissues: Secondary | ICD-10-CM

## 2020-04-16 DIAGNOSIS — E8889 Other specified metabolic disorders: Secondary | ICD-10-CM

## 2020-04-16 DIAGNOSIS — N184 Chronic kidney disease, stage 4 (severe): Secondary | ICD-10-CM

## 2020-04-16 DIAGNOSIS — M25551 Pain in right hip: Secondary | ICD-10-CM

## 2020-04-16 DIAGNOSIS — R7689 Other specified abnormal immunological findings in serum: Secondary | ICD-10-CM

## 2020-04-16 DIAGNOSIS — G894 Chronic pain syndrome: Secondary | ICD-10-CM

## 2020-04-16 DIAGNOSIS — M25552 Pain in left hip: Secondary | ICD-10-CM

## 2020-04-16 DIAGNOSIS — E049 Nontoxic goiter, unspecified: Secondary | ICD-10-CM

## 2020-04-16 DIAGNOSIS — R002 Palpitations: Secondary | ICD-10-CM

## 2020-04-16 DIAGNOSIS — I10 Essential (primary) hypertension: Secondary | ICD-10-CM

## 2020-04-16 DIAGNOSIS — M5030 Other cervical disc degeneration, unspecified cervical region: Secondary | ICD-10-CM

## 2020-04-16 DIAGNOSIS — R778 Other specified abnormalities of plasma proteins: Secondary | ICD-10-CM

## 2020-04-16 DIAGNOSIS — G8929 Other chronic pain: Secondary | ICD-10-CM

## 2020-04-16 DIAGNOSIS — F418 Other specified anxiety disorders: Secondary | ICD-10-CM

## 2020-04-16 DIAGNOSIS — M51369 Other intervertebral disc degeneration, lumbar region without mention of lumbar back pain or lower extremity pain: Secondary | ICD-10-CM

## 2020-04-16 DIAGNOSIS — Z8639 Personal history of other endocrine, nutritional and metabolic disease: Secondary | ICD-10-CM

## 2020-04-16 DIAGNOSIS — E785 Hyperlipidemia, unspecified: Secondary | ICD-10-CM

## 2020-04-16 NOTE — Patient Instructions (Signed)
Back Exercises The following exercises strengthen the muscles that help to support the trunk and back. They also help to keep the lower back flexible. Doing these exercises can help to prevent back pain or lessen existing pain.  If you have back pain or discomfort, try doing these exercises 2-3 times each day or as told by your health care provider.  As your pain improves, do them once each day, but increase the number of times that you repeat the steps for each exercise (do more repetitions).  To prevent the recurrence of back pain, continue to do these exercises once each day or as told by your health care provider. Do exercises exactly as told by your health care provider and adjust them as directed. It is normal to feel mild stretching, pulling, tightness, or discomfort as you do these exercises, but you should stop right away if you feel sudden pain or your pain gets worse. Exercises Single knee to chest Repeat these steps 3-5 times for each leg: 1. Lie on your back on a firm bed or the floor with your legs extended. 2. Bring one knee to your chest. Your other leg should stay extended and in contact with the floor. 3. Hold your knee in place by grabbing your knee or thigh with both hands and hold. 4. Pull on your knee until you feel a gentle stretch in your lower back or buttocks. 5. Hold the stretch for 10-30 seconds. 6. Slowly release and straighten your leg. Pelvic tilt Repeat these steps 5-10 times: 1. Lie on your back on a firm bed or the floor with your legs extended. 2. Bend your knees so they are pointing toward the ceiling and your feet are flat on the floor. 3. Tighten your lower abdominal muscles to press your lower back against the floor. This motion will tilt your pelvis so your tailbone points up toward the ceiling instead of pointing to your feet or the floor. 4. With gentle tension and even breathing, hold this position for 5-10 seconds. Cat-cow Repeat these steps until  your lower back becomes more flexible: 1. Get into a hands-and-knees position on a firm surface. Keep your hands under your shoulders, and keep your knees under your hips. You may place padding under your knees for comfort. 2. Let your head hang down toward your chest. Contract your abdominal muscles and point your tailbone toward the floor so your lower back becomes rounded like the back of a cat. 3. Hold this position for 5 seconds. 4. Slowly lift your head, let your abdominal muscles relax and point your tailbone up toward the ceiling so your back forms a sagging arch like the back of a cow. 5. Hold this position for 5 seconds.  Press-ups Repeat these steps 5-10 times: 1. Lie on your abdomen (face-down) on the floor. 2. Place your palms near your head, about shoulder-width apart. 3. Keeping your back as relaxed as possible and keeping your hips on the floor, slowly straighten your arms to raise the top half of your body and lift your shoulders. Do not use your back muscles to raise your upper torso. You may adjust the placement of your hands to make yourself more comfortable. 4. Hold this position for 5 seconds while you keep your back relaxed. 5. Slowly return to lying flat on the floor.  Bridges Repeat these steps 10 times: 1. Lie on your back on a firm surface. 2. Bend your knees so they are pointing toward the ceiling and   your feet are flat on the floor. Your arms should be flat at your sides, next to your body. 3. Tighten your buttocks muscles and lift your buttocks off the floor until your waist is at almost the same height as your knees. You should feel the muscles working in your buttocks and the back of your thighs. If you do not feel these muscles, slide your feet 1-2 inches farther away from your buttocks. 4. Hold this position for 3-5 seconds. 5. Slowly lower your hips to the starting position, and allow your buttocks muscles to relax completely. If this exercise is too easy, try  doing it with your arms crossed over your chest. Abdominal crunches Repeat these steps 5-10 times: 1. Lie on your back on a firm bed or the floor with your legs extended. 2. Bend your knees so they are pointing toward the ceiling and your feet are flat on the floor. 3. Cross your arms over your chest. 4. Tip your chin slightly toward your chest without bending your neck. 5. Tighten your abdominal muscles and slowly raise your trunk (torso) high enough to lift your shoulder blades a tiny bit off the floor. Avoid raising your torso higher than that because it can put too much stress on your low back and does not help to strengthen your abdominal muscles. 6. Slowly return to your starting position. Back lifts Repeat these steps 5-10 times: 1. Lie on your abdomen (face-down) with your arms at your sides, and rest your forehead on the floor. 2. Tighten the muscles in your legs and your buttocks. 3. Slowly lift your chest off the floor while you keep your hips pressed to the floor. Keep the back of your head in line with the curve in your back. Your eyes should be looking at the floor. 4. Hold this position for 3-5 seconds. 5. Slowly return to your starting position. Contact a health care provider if:  Your back pain or discomfort gets much worse when you do an exercise.  Your worsening back pain or discomfort does not lessen within 2 hours after you exercise. If you have any of these problems, stop doing these exercises right away. Do not do them again unless your health care provider says that you can. Get help right away if:  You develop sudden, severe back pain. If this happens, stop doing the exercises right away. Do not do them again unless your health care provider says that you can. This information is not intended to replace advice given to you by your health care provider. Make sure you discuss any questions you have with your health care provider. Document Revised: 10/05/2018 Document  Reviewed: 03/02/2018 Elsevier Patient Education  [place]. Hip Exercises Ask your health care provider which exercises are safe for you. Do exercises exactly as told by your health care provider and adjust them as directed. It is normal to feel mild stretching, pulling, tightness, or discomfort as you do these exercises. Stop right away if you feel sudden pain or your pain gets worse. Do not begin these exercises until told by your health care provider. Stretching and range-of-motion exercises These exercises warm up your muscles and joints and improve the movement and flexibility of your hip. These exercises also help to relieve pain, numbness, and tingling. You may be asked to limit your range of motion if you had a hip replacement. Talk to your health care provider about these restrictions. Hamstrings, supine  1. Lie on your back (supine position).  2. Loop a belt or towel over the ball of your left / right foot. The ball of your foot is on the walking surface, right under your toes. 3. Straighten your left / right knee and slowly pull on the belt or towel to raise your leg until you feel a gentle stretch behind your knee (hamstring). ? Do not let your knee bend while you do this. ? Keep your other leg flat on the floor. 4. Hold this position for __________ seconds. 5. Slowly return your leg to the starting position. Repeat __________ times. Complete this exercise __________ times a day. Hip rotation  1. Lie on your back on a firm surface. 2. With your left / right hand, gently pull your left / right knee toward the shoulder that is on the same side of the body. Stop when your knee is pointing toward the ceiling. 3. Hold your left / right ankle with your other hand. 4. Keeping your knee steady, gently pull your left / right ankle toward your other shoulder until you feel a stretch in your buttocks. ? Keep your hips and shoulders firmly planted while you do this stretch. 5. Hold  this position for __________ seconds. Repeat __________ times. Complete this exercise __________ times a day. Seated stretch This exercise is sometimes called hamstrings and adductors stretch. 1. Sit on the floor with your legs stretched wide. Keep your knees straight during this exercise. 2. Keeping your head and back in a straight line, bend at your waist to reach for your left foot (position A). You should feel a stretch in your right inner thigh (adductors). 3. Hold this position for __________ seconds. Then slowly return to the upright position. 4. Keeping your head and back in a straight line, bend at your waist to reach forward (position B). You should feel a stretch behind both of your thighs and knees (hamstrings). 5. Hold this position for __________ seconds. Then slowly return to the upright position. 6. Keeping your head and back in a straight line, bend at your waist to reach for your right foot (position C). You should feel a stretch in your left inner thigh (adductors). 7. Hold this position for __________ seconds. Then slowly return to the upright position. Repeat __________ times. Complete this exercise __________ times a day. Lunge This exercise stretches the muscles of the hip (hip flexors). 1. Place your left / right knee on the floor and bend your other knee so that is directly over your ankle. You should be half-kneeling. 2. Keep good posture with your head over your shoulders. 3. Tighten your buttocks to point your tailbone downward. This will prevent your back from arching too much. 4. You should feel a gentle stretch in the front of your left / right thigh and hip. If you do not feel a stretch, slide your other foot forward slightly and then slowly lunge forward with your chest up until your knee once again lines up over your ankle. ? Make sure your tailbone continues to point downward. 5. Hold this position for __________ seconds. 6. Slowly return to the starting  position. Repeat __________ times. Complete this exercise __________ times a day. Strengthening exercises These exercises build strength and endurance in your hip. Endurance is the ability to use your muscles for a long time, even after they get tired. Bridge This exercise strengthens the muscles of your hip (hip extensors). 1. Lie on your back on a firm surface with your knees bent and your feet flat  on the floor. 2. Tighten your buttocks muscles and lift your bottom off the floor until the trunk of your body and your hips are level with your thighs. ? Do not arch your back. ? You should feel the muscles working in your buttocks and the back of your thighs. If you do not feel these muscles, slide your feet 1-2 inches (2.5-5 cm) farther away from your buttocks. 3. Hold this position for __________ seconds. 4. Slowly lower your hips to the starting position. 5. Let your muscles relax completely between repetitions. Repeat __________ times. Complete this exercise __________ times a day. Straight leg raises, side-lying This exercise strengthens the muscles that move the hip joint away from the center of the body (hip abductors). 1. Lie on your side with your left / right leg in the top position. Lie so your head, shoulder, hip, and knee line up. You may bend your bottom knee slightly to help you balance. 2. Roll your hips slightly forward, so your hips are stacked directly over each other and your left / right knee is facing forward. 3. Leading with your heel, lift your top leg 4-6 inches (10-15 cm). You should feel the muscles in your top hip lifting. ? Do not let your foot drift forward. ? Do not let your knee roll toward the ceiling. 4. Hold this position for __________ seconds. 5. Slowly return to the starting position. 6. Let your muscles relax completely between repetitions. Repeat __________ times. Complete this exercise __________ times a day. Straight leg raises, side-lying This  exercise strengthens the muscles that move the hip joint toward the center of the body (hip adductors). 1. Lie on your side with your left / right leg in the bottom position. Lie so your head, shoulder, hip, and knee line up. You may place your upper foot in front to help you balance. 2. Roll your hips slightly forward, so your hips are stacked directly over each other and your left / right knee is facing forward. 3. Tense the muscles in your inner thigh and lift your bottom leg 4-6 inches (10-15 cm). 4. Hold this position for __________ seconds. 5. Slowly return to the starting position. 6. Let your muscles relax completely between repetitions. Repeat __________ times. Complete this exercise __________ times a day. Straight leg raises, supine This exercise strengthens the muscles in the front of your thigh (quadriceps). 1. Lie on your back (supine position) with your left / right leg extended and your other knee bent. 2. Tense the muscles in the front of your left / right thigh. You should see your kneecap slide up or see increased dimpling just above your knee. 3. Keep these muscles tight as you raise your leg 4-6 inches (10-15 cm) off the floor. Do not let your knee bend. 4. Hold this position for __________ seconds. 5. Keep these muscles tense as you lower your leg. 6. Relax the muscles slowly and completely between repetitions. Repeat __________ times. Complete this exercise __________ times a day. Hip abductors, standing This exercise strengthens the muscles that move the leg and hip joint away from the center of the body (hip abductors). 1. Tie one end of a rubber exercise band or tubing to a secure surface, such as a chair, table, or pole. 2. Loop the other end of the band or tubing around your left / right ankle. 3. Keeping your ankle with the band or tubing directly opposite the secured end, step away until there is tension in the tubing  or band. Hold on to a chair, table, or pole as  needed for balance. 4. Lift your left / right leg out to your side. While you do this: ? Keep your back upright. ? Keep your shoulders over your hips. ? Keep your toes pointing forward. ? Make sure to use your hip muscles to slowly lift your leg. Do not tip your body or forcefully lift your leg. 5. Hold this position for __________ seconds. 6. Slowly return to the starting position. Repeat __________ times. Complete this exercise __________ times a day. Squats This exercise strengthens the muscles in the front of your thigh (quadriceps). 1. Stand in a door frame so your feet and knees are in line with the frame. You may place your hands on the frame for balance. 2. Slowly bend your knees and lower your hips like you are going to sit in a chair. ? Keep your lower legs in a straight-up-and-down position. ? Do not let your hips go lower than your knees. ? Do not bend your knees lower than told by your health care provider. ? If your hip pain increases, do not bend as low. 3. Hold this position for ___________ seconds. 4. Slowly push with your legs to return to standing. Do not use your hands to pull yourself to standing. Repeat __________ times. Complete this exercise __________ times a day. This information is not intended to replace advice given to you by your health care provider. Make sure you discuss any questions you have with your health care provider. Document Revised: 01/04/2019 Document Reviewed: 04/11/2018 Elsevier Patient Education  [place].

## 2020-04-17 ENCOUNTER — Other Ambulatory Visit: Payer: Medicare Other

## 2020-04-21 ENCOUNTER — Other Ambulatory Visit: Payer: Self-pay | Admitting: Nephrology

## 2020-04-21 DIAGNOSIS — N281 Cyst of kidney, acquired: Secondary | ICD-10-CM

## 2020-05-02 NOTE — Progress Notes (Signed)
Name: Patricia Dominguez  MRN/ DOB: [id number], [birth year]    Age/ Sex: 64 y.o., female    PCP: Patricia Muskrat, DO   Reason for Endocrinology Evaluation: Thyromegaly     Date of Initial Endocrinology Evaluation: 05/05/2020     HPI: [name] is a 64 y.o. female with a past medical history of HTN, CKD IV  and Positive ANA. The patient presented for initial endocrinology clinic visit on 05/05/2020 for consultative assistance with her Thyromegaly.     [name] during follow up with cardiology in 02/2020. TSH was normal at 1.720 uIU/mL with normal FT3 but elevated FT4 at 1.8 ng/dL.   Today she is accompanied by her friend Patricia Dominguez Has GERD but no local neck symptoms per se but has SOB  She denies weight loss  Has occasional palpitations  Denies diarrhea but has constipation    NO more biotin use     NO FH of thyroid disease    HISTORY:  Past Medical History:  Past Medical History:  Diagnosis Date  . Back pain   . Breast pain   . Chronic kidney disease   . Hyperlipidemia   . Hypertension   . Neck pain   . Systemic lupus erythematosus ([place])    Past Surgical History:  Past Surgical History:  Procedure Laterality Date  . ABDOMINAL HYSTERECTOMY    . arm surgery Left   . CERVICAL SPINE SURGERY        Social History:  reports that she has been smoking cigarettes. She has smoked for the past 20.00 years. She has never used smokeless tobacco. She reports that she does not drink alcohol and does not use drugs.  Family History: family history includes Heart attack (age of onset: 58) in her sister; Heart attack (age of onset: 79) in her father; Kidney failure in her mother.   HOME MEDICATIONS: Allergies as of 05/05/2020      Reactions   Ivp Dye [iodinated Diagnostic Agents] Nausea And Vomiting   Iodine    Penicillins Other (See Comments)   Passed out after penicillin injection at 64 years old.    Sulfa Antibiotics Other (See  Comments)   Unknown allergic reaction      Medication List       Accurate as of May 05, 2020 11:00 AM. If you have any questions, ask your nurse or doctor.        ALPRAZolam 0.5 MG tablet Commonly known as: XANAX Take 0.5 mg by mouth at bedtime as needed for anxiety.   amLODipine 10 MG tablet Commonly known as: NORVASC Take 1 tablet by mouth daily.   aspirin 81 MG EC tablet Take 1 tablet (81 mg total) by mouth daily.   atorvastatin 20 MG tablet Commonly known as: LIPITOR Take 20 mg by mouth at bedtime.   B COMPLEX PO Take 1 tablet by mouth daily.   carvedilol 6.25 MG tablet Commonly known as: COREG TAKE 1 TABLET(6.25 MG) BY MOUTH TWICE DAILY   doxazosin 1 MG tablet Commonly known as: CARDURA Take 1 mg by mouth daily.   ferrous sulfate 325 (65 FE) MG tablet Take 1 tablet by mouth daily.   gabapentin 300 MG capsule Commonly known as: NEURONTIN Take 600 mg by mouth as needed.   oxyCODONE-acetaminophen 10-325 MG tablet Commonly known as: PERCOCET TK 1 T PO Q 4 TO 6 H PRN P   TUMS PO Take by mouth.  REVIEW OF SYSTEMS: A comprehensive ROS was conducted with the patient and is negative except as per HPI   OBJECTIVE:  VS: BP 130/60   Pulse 72   Ht 5' 7.5" (1.715 m)   Wt 150 lb 4 oz (68.2 kg)   SpO2 97%   BMI 23.19 kg/m    Wt Readings from Last 3 Encounters:  05/05/20 150 lb 4 oz (68.2 kg)  04/16/20 147 lb 12.8 oz (67 kg)  03/25/20 146 lb 3.2 oz (66.3 kg)     EXAM: General: Pt appears well and is in NAD  Neck: General: Supple without adenopathy. Thyroid: Thyromegaly L > R   Lungs: Clear with good BS bilat with no rales, rhonchi, or wheezes  Heart: Auscultation: RRR.  Abdomen: Normoactive bowel sounds, soft, nontender, without masses or organomegaly palpable  Extremities:  BL LE: No pretibial edema normal ROM and strength.  Skin: Hair: Texture and amount normal with gender appropriate distribution Skin Inspection: No rashes,  acanthosis nigricans/skin tags. No lipohypertrophy Skin Palpation: Skin temperature, texture, and thickness normal to palpation  Neuro: Cranial nerves: II - XII grossly intact  Motor: Normal strength throughout DTRs: 2+ and symmetric in UE without delay in relaxation phase  Mental Status: Judgment, insight: Intact Orientation: Oriented to time, place, and person Mood and affect: No depression, anxiety, or agitation     DATA REVIEWED: Results for Patricia, Dominguez (MRN [id number]) as of 05/06/2020 07:50  Ref. Range 05/05/2020 11:24  TSH Latest Ref Range: 0.35 - 4.50 uIU/mL 1.39  Triiodothyronine (T3) Latest Ref Range: 76 - 181 ng/dL 96  T4,Free(Direct) Latest Ref Range: 0.60 - 1.60 ng/dL 0.96    ASSESSMENT/PLAN/RECOMMENDATIONS:   1. Thyromegaly :   - NO local neck symptoms - Pt is clinically and biochemically euthyroid  - Will proceed with thyroid ultrasound     F/U in 6 months    Signed electronically by: Patricia Guise, MD  [facility] Endocrinology  [facility] [address], [place] [address] Phone: [phone] FAX: [phone]   CC: Patricia Muskrat, DO No address on file Phone: None Fax: [phone]   Return to Endocrinology clinic as below: Future Appointments  Date Time Provider [place]  05/07/2020  2:30 PM Patricia Jack, MD [facility] None  05/13/2020  3:30 PM GI-315 MR 3 [id number] [facility] [facility]  06/24/2020  9:15 AM Patricia Merino, MD [facility] None  09/02/2020  1:00 PM Patricia Margarita, MD [id number]

## 2020-05-05 ENCOUNTER — Other Ambulatory Visit: Payer: Self-pay

## 2020-05-05 ENCOUNTER — Encounter: Payer: Self-pay | Admitting: Internal Medicine

## 2020-05-05 ENCOUNTER — Ambulatory Visit (INDEPENDENT_AMBULATORY_CARE_PROVIDER_SITE_OTHER): Payer: Medicare Other | Admitting: Internal Medicine

## 2020-05-05 VITALS — BP 130/60 | HR 72 | Ht 67.5 in | Wt 150.2 lb

## 2020-05-05 DIAGNOSIS — E010 Iodine-deficiency related diffuse (endemic) goiter: Secondary | ICD-10-CM | POA: Diagnosis not present

## 2020-05-05 LAB — T4, FREE: Free T4: 0.96 ng/dL (ref 0.60–1.60)

## 2020-05-05 LAB — TSH: TSH: 1.39 u[IU]/mL (ref 0.35–4.50)

## 2020-05-05 NOTE — Patient Instructions (Signed)
-   Please stop by the lab today  

## 2020-05-06 ENCOUNTER — Encounter (HOSPITAL_COMMUNITY): Payer: Self-pay | Admitting: Surgery

## 2020-05-06 ENCOUNTER — Encounter: Payer: Self-pay | Admitting: Internal Medicine

## 2020-05-06 LAB — T3: T3, Total: 96 ng/dL (ref 76–181)

## 2020-05-07 ENCOUNTER — Other Ambulatory Visit: Payer: Self-pay

## 2020-05-07 ENCOUNTER — Other Ambulatory Visit (HOSPITAL_COMMUNITY)
Admission: RE | Admit: 2020-05-07 | Discharge: 2020-05-07 | Disposition: A | Discharge status: Home / Self Care | Payer: Medicare Other | Source: Ambulatory Visit | Attending: Nephrology | Admitting: Nephrology

## 2020-05-07 ENCOUNTER — Inpatient Hospital Stay (HOSPITAL_COMMUNITY): Payer: Medicare Other | Attending: Hematology | Admitting: Hematology

## 2020-05-07 ENCOUNTER — Inpatient Hospital Stay (HOSPITAL_COMMUNITY): Payer: Medicare Other

## 2020-05-07 VITALS — BP 161/72 | HR 66 | Temp 97.7°F | Resp 18 | Ht 67.5 in | Wt 152.9 lb

## 2020-05-07 DIAGNOSIS — D472 Monoclonal gammopathy: Secondary | ICD-10-CM

## 2020-05-07 DIAGNOSIS — R748 Abnormal levels of other serum enzymes: Secondary | ICD-10-CM

## 2020-05-07 DIAGNOSIS — N184 Chronic kidney disease, stage 4 (severe): Secondary | ICD-10-CM | POA: Insufficient documentation

## 2020-05-07 DIAGNOSIS — R778 Other specified abnormalities of plasma proteins: Secondary | ICD-10-CM | POA: Diagnosis present

## 2020-05-07 LAB — URINALYSIS, ROUTINE W REFLEX MICROSCOPIC
Bilirubin Urine: NEGATIVE
Glucose, UA: NEGATIVE mg/dL
Hgb urine dipstick: NEGATIVE
Ketones, ur: NEGATIVE mg/dL
Leukocytes,Ua: NEGATIVE
Nitrite: NEGATIVE
Protein, ur: 30 mg/dL — AB
Specific Gravity, Urine: 1.01 (ref 1.005–1.030)
pH: 6 (ref 5.0–8.0)

## 2020-05-07 LAB — RENAL FUNCTION PANEL
Albumin: 4.6 g/dL (ref 3.5–5.0)
Anion gap: 8 (ref 5–15)
BUN: 66 mg/dL — ABNORMAL HIGH (ref 8–23)
CO2: 25 mmol/L (ref 22–32)
Calcium: 9 mg/dL (ref 8.9–10.3)
Chloride: 103 mmol/L (ref 98–111)
Creatinine, Ser: 2.6 mg/dL — ABNORMAL HIGH (ref 0.44–1.00)
GFR, Estimated: 20 mL/min — ABNORMAL LOW (ref >60)
Glucose, Bld: 92 mg/dL (ref 70–99)
Phosphorus: 5 mg/dL — ABNORMAL HIGH (ref 2.5–4.6)
Potassium: 4.5 mmol/L (ref 3.5–5.1)
Sodium: 136 mmol/L (ref 135–145)

## 2020-05-07 LAB — PROTEIN / CREATININE RATIO, URINE
Creatinine, Urine: 70.47 mg/dL
Protein Creatinine Ratio: 0.35 mg/mg{Cre} — ABNORMAL HIGH (ref 0.00–0.15)
Total Protein, Urine: 25 mg/dL

## 2020-05-07 LAB — CBC WITH DIFFERENTIAL/PLATELET
Abs Immature Granulocytes: 0.02 10*3/uL (ref 0.00–0.07)
Basophils Absolute: 0.1 10*3/uL (ref 0.0–0.1)
Basophils Relative: 1 %
Eosinophils Absolute: 0.1 10*3/uL (ref 0.0–0.5)
Eosinophils Relative: 2 %
HCT: 34.9 % — ABNORMAL LOW (ref 36.0–46.0)
Hemoglobin: 10.5 g/dL — ABNORMAL LOW (ref 12.0–15.0)
Immature Granulocytes: 0 %
Lymphocytes Relative: 39 %
Lymphs Abs: 2.8 10*3/uL (ref 0.7–4.0)
MCH: 21.6 pg — ABNORMAL LOW (ref 26.0–34.0)
MCHC: 30.1 g/dL (ref 30.0–36.0)
MCV: 72 fL — ABNORMAL LOW (ref 80.0–100.0)
Monocytes Absolute: 0.8 10*3/uL (ref 0.1–1.0)
Monocytes Relative: 11 %
Neutro Abs: 3.3 10*3/uL (ref 1.7–7.7)
Neutrophils Relative %: 47 %
Platelets: 264 10*3/uL (ref 150–400)
RBC: 4.85 MIL/uL (ref 3.87–5.11)
RDW: 16.6 % — ABNORMAL HIGH (ref 11.5–15.5)
WBC: 7.2 10*3/uL (ref 4.0–10.5)
nRBC: 0 % (ref 0.0–0.2)

## 2020-05-07 LAB — MAGNESIUM: Magnesium: 2.1 mg/dL (ref 1.7–2.4)

## 2020-05-07 LAB — LACTATE DEHYDROGENASE: LDH: 187 U/L (ref 98–192)

## 2020-05-07 LAB — VITAMIN D 25 HYDROXY (VIT D DEFICIENCY, FRACTURES): Vit D, 25-Hydroxy: 29.79 ng/mL — ABNORMAL LOW (ref 30–100)

## 2020-05-07 NOTE — Progress Notes (Signed)
[name] [address] [place] [address]   CLINIC:  Medical Oncology/Hematology  Patient Care Team: [name], DO as PCP - General (Family Medicine) Sueanne Margarita, MD as PCP - Cardiology (Cardiology)  CHIEF COMPLAINTS/PURPOSE OF CONSULTATION:  Evaluation of abnormal SPEP  HISTORY OF PRESENTING ILLNESS:  Patricia Dominguez 64 y.o. female is here because of evaluation of abnormal SPEP, at the request of Dr. Bo Merino.  Today she is accompanied by her friend, [name], and she reports feeling okay. She denies having any new bone pains and reports having chronic right sided back pain since 2002. She reports having hot flashes, but denies F/C, leg swelling or unexplained weight loss. She also reports occasional numbness and tingling in her fingertips when they turn blue when exposed to cold or stress. She reports feeling stressed since getting her diagnosis of lupus and CKD.  She is disabled since 2002. She used to work for [employer] in [place] and reports that a wing fell on her. She is down to smoking 1 cigarette per day, peaking at 2 packs per month. Her mother and daughter have lupus; her maternal aunt had gastric cancer. She denies family history of sickle cell or thalassemia.  Dr. Royce Macadamia is her nephrologist and will see her again next week.   MEDICAL HISTORY:  Past Medical History:  Diagnosis Date  . Back pain   . Breast pain   . Chronic kidney disease   . Hyperlipidemia   . Hypertension   . Neck pain   . Systemic lupus erythematosus ([place])     SURGICAL HISTORY: Past Surgical History:  Procedure Laterality Date  . ABDOMINAL HYSTERECTOMY    . APPENDECTOMY    . arm surgery Left   . CERVICAL SPINE SURGERY      SOCIAL HISTORY: Social History   Socioeconomic History  . Marital status: Legally Separated    Spouse name: Not on file  . Number of children: Not on file  . Years of education: Not on file  . Highest education level:  Not on file  Occupational History  . Occupation: Disability  Tobacco Use  . Smoking status: Former Smoker    Years: 20.00    Types: Cigarettes    Quit date: 04/14/2020    Years since quitting: 0.0  . Smokeless tobacco: Never Used  Vaping Use  . Vaping Use: Never used  Substance and Sexual Activity  . Alcohol use: No  . Drug use: No  . Sexual activity: Not on file  Other Topics Concern  . Not on file  Social History Narrative  . Not on file   Social Determinants of Health   Financial Resource Strain: Medium Risk  . Difficulty of Paying Living Expenses: Somewhat hard  Food Insecurity: No Food Insecurity  . Worried About Charity fundraiser in the Last Year: Never true  . Ran Out of Food in the Last Year: Never true  Transportation Needs: Unmet Transportation Needs  . Lack of Transportation (Medical): Yes  . Lack of Transportation (Non-Medical): Yes  Physical Activity: Insufficiently Active  . Days of Exercise per Week: 2 days  . Minutes of Exercise per Session: 30 min  Stress: Stress Concern Present  . Feeling of Stress : Rather much  Social Connections: Moderately Integrated  . Frequency of Communication with Friends and Family: More than three times a week  . Frequency of Social Gatherings with Friends and Family: Three times a week  . Attends Religious  Services: More than 4 times per year  . Active Member of Clubs or Organizations: Yes  . Attends Archivist Meetings: 1 to 4 times per year  . Marital Status: Separated  Intimate Partner Violence: Not At Risk  . Fear of Current or Ex-Partner: No  . Emotionally Abused: No  . Physically Abused: No  . Sexually Abused: No    FAMILY HISTORY: Family History  Problem Relation Age of Onset  . Kidney failure Mother   . Heart attack Father 78  . Heart attack Sister 46    ALLERGIES:  is allergic to ivp dye [iodinated diagnostic agents], iodine, penicillins, and sulfa antibiotics.  MEDICATIONS:  Current  Outpatient Medications  Medication Sig Dispense Refill  . ALPRAZolam (XANAX) 0.5 MG tablet Take 0.5 mg by mouth at bedtime as needed for anxiety.    Marland Kitchen amLODipine (NORVASC) 10 MG tablet Take 1 tablet by mouth daily.    Marland Kitchen aspirin EC 81 MG EC tablet Take 1 tablet (81 mg total) by mouth daily.    Marland Kitchen atorvastatin (LIPITOR) 20 MG tablet Take 20 mg by mouth at bedtime.    . carvedilol (COREG) 6.25 MG tablet TAKE 1 TABLET(6.25 MG) BY MOUTH TWICE DAILY 180 tablet 2  . Cyanocobalamin (VITAMIN B-12) 5000 MCG SUBL Place under the tongue.    Marland Kitchen doxazosin (CARDURA) 1 MG tablet Take 1 mg by mouth daily.    Marland Kitchen gabapentin (NEURONTIN) 300 MG capsule Take 600 mg by mouth as needed.     . ondansetron (ZOFRAN) 4 MG tablet Take 4 mg by mouth every 8 (eight) hours as needed for nausea or vomiting.    Marland Kitchen oxyCODONE-acetaminophen (PERCOCET) 10-325 MG tablet TK 1 T PO Q 4 TO 6 H PRN P    . ferrous sulfate 325 (65 FE) MG tablet Take 1 tablet by mouth daily. (Patient not taking: Reported on 05/07/2020)     No current facility-administered medications for this visit.    REVIEW OF SYSTEMS:   Review of Systems  Constitutional: Positive for appetite change (75%) and fatigue (75%). Negative for chills, fever and unexpected weight change.  Respiratory: Positive for shortness of breath (at night).   Cardiovascular: Positive for palpitations. Negative for leg swelling.  Endocrine: Positive for hot flashes.  Musculoskeletal: Positive for back pain (chronic on R side).  Neurological: Positive for dizziness, headaches and numbness (numbness and tingling when fingers turn blue).  Psychiatric/Behavioral: Positive for sleep disturbance. The patient is nervous/anxious.      PHYSICAL EXAMINATION: ECOG PERFORMANCE STATUS: 1 - Symptomatic but completely ambulatory  Vitals:   05/07/20 1449  BP: (!) 161/72  Pulse: 66  Resp: 18  Temp: 97.7 F (36.5 C)  SpO2: 96%   Filed Weights   05/07/20 1449  Weight: 152 lb 14.4 oz (69.4 kg)    Physical Exam Vitals reviewed.  Constitutional:      Appearance: Normal appearance.  Neck:     Thyroid: Thyromegaly (mild) present.  Cardiovascular:     Rate and Rhythm: Normal rate and regular rhythm.     Pulses: Normal pulses.     Heart sounds: Normal heart sounds.  Pulmonary:     Effort: Pulmonary effort is normal.     Breath sounds: Normal breath sounds.  Abdominal:     Palpations: Abdomen is soft. There is no hepatomegaly, splenomegaly or mass.     Tenderness: There is no abdominal tenderness.     Hernia: No hernia is present.  Musculoskeletal:  Right lower leg: No edema.     Left lower leg: No edema.  Lymphadenopathy:     Cervical: No cervical adenopathy.     Upper Body:     Right upper body: No supraclavicular, axillary or pectoral adenopathy.     Left upper body: No supraclavicular, axillary or pectoral adenopathy.     Lower Body: No right inguinal adenopathy. No left inguinal adenopathy.  Neurological:     General: No focal deficit present.     Mental Status: She is alert and oriented to person, place, and time.  Psychiatric:        Mood and Affect: Mood normal.        Behavior: Behavior normal.      LABORATORY DATA:  I have reviewed the data as listed Recent Results (from the past 2160 hour(s))  Lupus Anticoagulant Eval w/Reflex     Status: None   Collection Time: 03/25/20 10:08 AM  Result Value Ref Range   Lupus Anticoagulant see note     Comment: A Lupus Anticoagulant is not detected. Marland Kitchen Reference Range:  Not Detected . For additional information, please refer to http://education.questdiagnostics.com/faq/FAQ01v2 . (This link is being provided for informational/ educational purposes only.) . Marland Kitchen This interpretation is based on the following test results. Marland Kitchen    PTT-LA Screen 37 <=40 sec   dRVVT 37 <=45 sec   PT, Mixing Interp Not Indicated   Serum protein electrophoresis with reflex     Status: Abnormal   Collection Time: 03/25/20 10:08 AM   Result Value Ref Range   Total Protein 8.2 (H) 6.1 - 8.1 g/dL   Albumin ELP 4.8 3.8 - 4.8 g/dL   Alpha 1 0.3 0.2 - 0.3 g/dL   Alpha 2 0.8 0.5 - 0.9 g/dL   Beta Globulin 0.5 0.4 - 0.6 g/dL   Beta 2 0.3 0.2 - 0.5 g/dL   Gamma Globulin 1.5 0.8 - 1.7 g/dL   Abnormal Protein Band1 NOTE NONE DETEC g/dL   SPE Interp.      Comment: . A poorly-defined band of restricted protein mobility is detected in the gamma globulins. It is unlikely that this may represent a monoclonal protein; however, immunofixation analysis is available if clinically indicated. .   IgG, IgA, IgM     Status: Abnormal   Collection Time: 03/25/20 10:08 AM  Result Value Ref Range   Immunoglobulin A 192 70 - 320 mg/dL   IgG (Immunoglobin G), Serum 1,663 (H) 600 - 1,540 mg/dL   IgM, Serum 170 50 - 300 mg/dL  Protein / creatinine ratio, urine     Status: Abnormal   Collection Time: 03/25/20 10:08 AM  Result Value Ref Range   Creatinine, Urine 45 20 - 275 mg/dL   Protein/Creat Ratio 467 (H) 21 - 161 mg/g creat   Protein/Creatinine Ratio 0.467 (H) 0.021 - 0.16 mg/mg creat   Total Protein, Urine 21 5 - 24 mg/dL  IFE Interpretation     Status: None   Collection Time: 03/25/20 10:08 AM  Result Value Ref Range   Immunofix Electr Int      Comment: . A poorly-defined area of restricted protein mobility is detected and is reactive with kappa light chain antisera. .   TSH     Status: None   Collection Time: 05/05/20 11:24 AM  Result Value Ref Range   TSH 1.39 0.35 - 4.50 uIU/mL    RADIOGRAPHIC STUDIES: I have personally reviewed the radiological images as listed and agreed with the findings  in the report. US RENAL  Result Date: 04/16/2020 CLINICAL DATA:  Initial evaluation for chronic kidney disease stage 4. EXAM: RENAL / URINARY TRACT ULTRASOUND COMPLETE COMPARISON:  None available. FINDINGS: Right Kidney: Renal measurements: 11.3 x 3.7 x 4.6 cm = volume: 103 mL. Increased echogenicity seen within the renal parenchyma  with poor corticomedullary differentiation. No nephrolithiasis or hydronephrosis. 1 cm simple anechoic cyst present at the upper pole. Left Kidney: Renal measurements: 9.5 x 5.5 x 4.6 cm = volume: 128 mL. Increased echogenicity within the renal parenchyma with poor corticomedullary differentiation. No nephrolithiasis or hydronephrosis. 1.7 x 1.2 x 2.1 cm mildly complex cystic lesion with scattered low-level internal echoes seen within the interpolar region. No appreciable internal vascularity or solid component. Bladder: Appears normal for degree of bladder distention. Neither ureteral jet is visualized. Other: None. IMPRESSION: 1. Increased echogenicity within the renal parenchyma, compatible with medical renal disease. No hydronephrosis. 2. 2.1 cm mildly complex cystic lesion at the interpolar left kidney, indeterminate. While this is favored to be benign, further assessment with dedicated renal mass protocol CT and/or MRI recommended for complete characterization. 3. 1 cm simple right renal cyst. Electronically Signed   By: Jeannine Boga M.D.   On: 04/16/2020 02:52    ASSESSMENT:  1.  Abnormal SPEP: -Patient seen at the request of Dr. Estanislado Pandy for abnormal SPEP. -SPEP on 03/25/2020 showed poorly defined band of restricted protein mobility in the gamma globulins. -Calcium was 10.2.  Denies any new onset bone pains.  Hemoglobin was 11.9. -Bilateral hip x-rays show right inferior medial joint space narrowing and sclerosis.  No SI joint changes were noted.  2.  CKD, stage IV: -She is evaluated by Dr. Royce Macadamia at [facility]. -Ultrasound kidneys on 04/15/2020 with increased echogenicity within the renal parenchyma compatible with medical renal disease.  2.1 cm mildly complex cystic lesion at the interpolar left kidney, indeterminate.  3.  Social/family history: -She worked at [employer] in [place], has been on disability since 2002 for left-sided back pain. -She smokes about 1  to 2 cigarettes/day.  Had smoked 2 packs/month at [facility]. -Her mother and daughter had lupus.  Maternal aunt had "stomach cancer".    PLAN:  1.  Abnormal SPEP: -I have talked to her about MGUS and other plasma cell disorders. -Recommend repeating SPEP, checking immunofixation, free light chains, LDH and beta-2 microglobulin. -We will also check 24-hour urine for UPEP, immunofixation and free light chains. -We will consider doing skeletal survey based on the above results. -We will see her back in 4 to 6 weeks to discuss results.    All questions were answered. The patient knows to call the clinic with any problems, questions or concerns.   Derek Jack, MD 05/07/20 3:50 PM  [place] [phone]   I, Milinda Antis, am acting as a scribe for Dr. Sanda Linger.  I, Derek Jack MD, have reviewed the above documentation for accuracy and completeness, and I agree with the above.

## 2020-05-07 NOTE — Patient Instructions (Signed)
[place] at [facility] Discharge Instructions  You were seen today by Dr. Delton Coombes. He is a Secretary/administrator.  He talked with you about your medical history, your family history and the events that led to you being here today.  He wants to do some additional lab work today.  He will see you back in 4 weeks.  We will not call you with the results, we will review everything once you return.     Thank you for choosing [place] at [facility] to provide your oncology and hematology care.  To afford each patient quality time with our provider, please arrive at least 15 minutes before your scheduled appointment time.   If you have a lab appointment with the [facility] please come in thru the Main Entrance and check in at the main information desk.  You need to re-schedule your appointment should you arrive 10 or more minutes late.  We strive to give you quality time with our providers, and arriving late affects you and other patients whose appointments are after yours.  Also, if you no show three or more times for appointments you may be dismissed from the clinic at the providers discretion.     Again, thank you for choosing [facility].  Our hope is that these requests will decrease the amount of time that you wait before being seen by our physicians.       _____________________________________________________________  Should you have questions after your visit to [facility], please contact our office at [phone] and follow the prompts.  Our office hours are 8:00 a.m. and 4:30 p.m. Monday - Friday.  Please note that voicemails left after 4:00 p.m. may not be returned until the following business day.  We are closed weekends and major holidays.  You do have access to a nurse 24-7, just call the main number to the clinic [phone] and do not press any options, hold on the line and a nurse will answer the  phone.    For prescription refill requests, have your pharmacy contact our office and allow 72 hours.    Due to Covid, you will need to wear a mask upon entering the hospital. If you do not have a mask, a mask will be given to you at the Main Entrance upon arrival. For doctor visits, patients may have 1 support person age 35 or older with them. For treatment visits, patients can not have anyone with them due to social distancing guidelines and our immunocompromised population.

## 2020-05-08 LAB — BETA 2 MICROGLOBULIN, SERUM: Beta-2 Microglobulin: 5.5 mg/L — ABNORMAL HIGH (ref 0.6–2.4)

## 2020-05-08 LAB — PTH, INTACT AND CALCIUM
Calcium, Total (PTH): 9.1 mg/dL (ref 8.7–10.3)
PTH: 102 pg/mL — ABNORMAL HIGH (ref 15–65)

## 2020-05-09 LAB — PROTEIN ELECTROPHORESIS, SERUM
A/G Ratio: 1.3 (ref 0.7–1.7)
Albumin ELP: 4.3 g/dL (ref 2.9–4.4)
Alpha-1-Globulin: 0.2 g/dL (ref 0.0–0.4)
Alpha-2-Globulin: 0.7 g/dL (ref 0.4–1.0)
Beta Globulin: 0.9 g/dL (ref 0.7–1.3)
Gamma Globulin: 1.5 g/dL (ref 0.4–1.8)
Globulin, Total: 3.4 g/dL (ref 2.2–3.9)
Total Protein ELP: 7.7 g/dL (ref 6.0–8.5)

## 2020-05-09 LAB — KAPPA/LAMBDA LIGHT CHAINS
Kappa free light chain: 100.4 mg/L — ABNORMAL HIGH (ref 3.3–19.4)
Kappa, lambda light chain ratio: 2.82 — ABNORMAL HIGH (ref 0.26–1.65)
Lambda free light chains: 35.6 mg/L — ABNORMAL HIGH (ref 5.7–26.3)

## 2020-05-11 ENCOUNTER — Other Ambulatory Visit: Payer: Medicare Other

## 2020-05-12 LAB — UIFE/LIGHT CHAINS/TP QN, 24-HR UR
Free Kappa Lt Chains,Ur: 65.03 mg/L (ref 0.63–113.79)
Free Kappa/Lambda Ratio: 9.33 (ref 1.03–31.76)
Free Lambda Lt Chains,Ur: 6.97 mg/L (ref 0.47–11.77)
Total Protein, Urine: 26.2 mg/dL

## 2020-05-12 LAB — IMMUNOFIXATION ELECTROPHORESIS
IgA: 183 mg/dL (ref 87–352)
IgG (Immunoglobin G), Serum: 1523 mg/dL (ref 586–1602)
IgM (Immunoglobulin M), Srm: 161 mg/dL (ref 26–217)
Total Protein ELP: 7.5 g/dL (ref 6.0–8.5)

## 2020-05-13 ENCOUNTER — Ambulatory Visit
Admission: RE | Admit: 2020-05-13 | Discharge: 2020-05-13 | Disposition: A | Discharge status: Home / Self Care | Payer: Medicare Other | Source: Ambulatory Visit | Attending: Nephrology | Admitting: Nephrology

## 2020-05-13 ENCOUNTER — Other Ambulatory Visit: Payer: Self-pay

## 2020-05-13 DIAGNOSIS — N281 Cyst of kidney, acquired: Secondary | ICD-10-CM

## 2020-05-21 ENCOUNTER — Ambulatory Visit
Admission: RE | Admit: 2020-05-21 | Discharge: 2020-05-21 | Disposition: A | Discharge status: Home / Self Care | Payer: Medicare Other | Source: Ambulatory Visit | Attending: Internal Medicine | Admitting: Internal Medicine

## 2020-05-21 DIAGNOSIS — E010 Iodine-deficiency related diffuse (endemic) goiter: Secondary | ICD-10-CM

## 2020-05-25 ENCOUNTER — Other Ambulatory Visit: Payer: Self-pay | Admitting: Cardiology

## 2020-06-11 NOTE — Progress Notes (Signed)
Virtual Visit via Telephone note  Location: Patient: Home  Provider: Clinic  This service was conducted via virtual visit.  The patient was located at home. I was located in my office.  Consent was obtained prior to the virtual visit and is aware of possible charges through their insurance for this visit.  The patient is an established patient.  Dr. Estanislado Pandy, MD conducted the virtual visit and Hazel Sams, PA-C acted as scribe during the service.  Office staff helped with scheduling follow up visits after the service was conducted.     I discussed the limitations of evaluation and management by telemedicine and the availability of in person appointments. The patient expressed understanding and agreed to proceed.  CC: Pain in multiple joints  History of Present Illness: Patient is a 64 year old female with a past medical history of positive ANA and DDD.  Patient reports she has been busy following along closely with her nephrologist (CKD) and oncologist (abnormal SPEP).  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   She has interimittent pain and swelling in both hands, both knees, and both feet.  She has not had any recent rashes or photosensitivity. She has mouth dryness but no eye dryness.  She denies any oral or nasal ulcers.  She continues to have myalgias and muscle tenderness.  She has intermittent symptoms of raynaud's which has been exacerbated by cooler weather temperatures.   Review of Systems  Constitutional: Positive for malaise/fatigue. Negative for fever.  HENT:       +Dry mouth  Eyes: Negative for photophobia, pain, discharge and redness.  Respiratory: Positive for shortness of breath. Negative for cough and wheezing.   Cardiovascular: Positive for leg swelling. Negative for chest pain and palpitations.  Gastrointestinal: Negative for blood in stool, constipation and diarrhea.  Genitourinary: Negative for dysuria.   Musculoskeletal: Positive for joint pain and myalgias. Negative for back pain and neck pain.       +Joint stiffness +Muscle tenderness  Skin: Negative for rash.  Neurological: Negative for dizziness and headaches.       +Numbness  Endo/Heme/Allergies: Bruises/bleeds easily.  Psychiatric/Behavioral: Negative for depression. The patient has insomnia. The patient is not nervous/anxious.       Observations/Objective: Physical Exam HENT:     Head: Normocephalic and atraumatic.  Eyes:     Conjunctiva/sclera: Conjunctivae normal.  Pulmonary:     Effort: Pulmonary effort is normal.  Neurological:     Mental Status: She is alert and oriented to person, place, and time.  Psychiatric:        Mood and Affect: Mood and affect normal.        Cognition and Memory: Memory normal.        Judgment: Judgment normal.    Patient reports morning stiffness for 2 hours.   Patient denies nocturnal pain.  Difficulty dressing/grooming: Reports Difficulty climbing stairs: Reports Difficulty getting out of chair: Reports Difficulty using hands for taps, buttons, cutlery, and/or writing: Reports   Assessment and Plan: Visit Diagnoses: Positive ANA (antinuclear antibody) - ANA 1: 1280, ENA negative, C3-C4 normal: She has been experiencing increased joint pain in multiple joints including both hands, both knees, and both feet.  She has noticed intermittent swelling in these joints as well.  She follows up with pain management on a regular basis.  She has also noticed an increased frequency and symptoms of Raynaud's with cooler weather temperatures but has not had any digital  ulcerations.  She has ongoing mouth dryness but has not had any increased eye dryness.  No oral or nasal ulcerations.  She has not had any recent rashes or photosensitivity.  She denies any swollen lymph nodes.  Discussed that it is difficult to assess the joint pain and inflammation she is experiencing over the telephone.  She will  follow-up in the office in 4 months to perform a thorough examination.  She will continue to follow-up with pain management.  Family history of lupus erythematosus - Mother and daughter  CKD (chronic kidney disease) stage 4, GFR 15-29 ml/min (HCC) -She was evaluated by Dr. Royce Macadamia at [facility].  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  She was recently referred for a kidney transplant.  Chronic pain of both hips - She continues to experience intermittent discomfort and instability in both hips.  We will further assess at her upcoming follow-up visit in person.  DDD (degenerative disc disease), cervical - She has intermittent discomfort and stiffness in her neck.  She is not having any symptoms of radiculopathy at this time.  DDD (degenerative disc disease), lumbar - Related to work-related injury in the past.  She continues to have chronic lower back pain.  Chronic pain syndrome - Followed at pain management.  Other medical conditions are listed as follows:   History of hypertension  Hyperlipidemia LDL goal <70  Palpitations  Abnormal SPEP-Abnormal SPEP on 03/25/20.  Immunofixation was normal.  Followed by Dr. Delton Coombes.  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    I discussed the assessment and treatment plan with the patient. The patient was provided an opportunity to ask questions and all were answered. The patient agreed with the plan and demonstrated an understanding of the instructions.   The patient was advised to call back or seek an in-person evaluation if the symptoms worsen or if the condition fails to improve as anticipated.  I provided 20 minutes of non-face-to-face time during this encounter.  Scribed byHazel Sams, PA-C  I reviewed the above note and attest to the  accuracy of the documentation.   Bo Merino, MD

## 2020-06-12 ENCOUNTER — Other Ambulatory Visit: Payer: Self-pay

## 2020-06-12 ENCOUNTER — Inpatient Hospital Stay (HOSPITAL_COMMUNITY): Payer: Medicare Other | Attending: Hematology | Admitting: Hematology

## 2020-06-12 VITALS — BP 127/49 | HR 57 | Temp 97.2°F | Resp 18 | Wt 153.8 lb

## 2020-06-12 DIAGNOSIS — D509 Iron deficiency anemia, unspecified: Secondary | ICD-10-CM | POA: Insufficient documentation

## 2020-06-12 DIAGNOSIS — Z79899 Other long term (current) drug therapy: Secondary | ICD-10-CM | POA: Insufficient documentation

## 2020-06-12 DIAGNOSIS — M549 Dorsalgia, unspecified: Secondary | ICD-10-CM | POA: Insufficient documentation

## 2020-06-12 DIAGNOSIS — D472 Monoclonal gammopathy: Secondary | ICD-10-CM | POA: Insufficient documentation

## 2020-06-12 DIAGNOSIS — R109 Unspecified abdominal pain: Secondary | ICD-10-CM | POA: Insufficient documentation

## 2020-06-12 DIAGNOSIS — G4700 Insomnia, unspecified: Secondary | ICD-10-CM | POA: Insufficient documentation

## 2020-06-12 DIAGNOSIS — R200 Anesthesia of skin: Secondary | ICD-10-CM | POA: Diagnosis not present

## 2020-06-12 DIAGNOSIS — R778 Other specified abnormalities of plasma proteins: Secondary | ICD-10-CM | POA: Diagnosis present

## 2020-06-12 DIAGNOSIS — N184 Chronic kidney disease, stage 4 (severe): Secondary | ICD-10-CM | POA: Diagnosis not present

## 2020-06-12 DIAGNOSIS — Z7982 Long term (current) use of aspirin: Secondary | ICD-10-CM | POA: Insufficient documentation

## 2020-06-12 DIAGNOSIS — E785 Hyperlipidemia, unspecified: Secondary | ICD-10-CM | POA: Insufficient documentation

## 2020-06-12 DIAGNOSIS — R61 Generalized hyperhidrosis: Secondary | ICD-10-CM | POA: Insufficient documentation

## 2020-06-12 DIAGNOSIS — I129 Hypertensive chronic kidney disease with stage 1 through stage 4 chronic kidney disease, or unspecified chronic kidney disease: Secondary | ICD-10-CM | POA: Insufficient documentation

## 2020-06-12 DIAGNOSIS — R630 Anorexia: Secondary | ICD-10-CM | POA: Insufficient documentation

## 2020-06-12 DIAGNOSIS — R232 Flushing: Secondary | ICD-10-CM | POA: Insufficient documentation

## 2020-06-12 DIAGNOSIS — K5900 Constipation, unspecified: Secondary | ICD-10-CM | POA: Diagnosis not present

## 2020-06-12 DIAGNOSIS — Z87891 Personal history of nicotine dependence: Secondary | ICD-10-CM | POA: Insufficient documentation

## 2020-06-12 DIAGNOSIS — R748 Abnormal levels of other serum enzymes: Secondary | ICD-10-CM | POA: Diagnosis present

## 2020-06-12 DIAGNOSIS — M7989 Other specified soft tissue disorders: Secondary | ICD-10-CM | POA: Diagnosis not present

## 2020-06-12 NOTE — Progress Notes (Signed)
[place], [place] [address]   CLINIC:  Medical Oncology/Hematology  PCP:  [name], DO No address on file  None  REASON FOR VISIT:  Follow-up for MGUS  PRIOR THERAPY: None  CURRENT THERAPY: Under work-up  INTERVAL HISTORY:  [name], a 64 y.o. 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 Patricia Dominguez was last seen on 05/07/2020.  Today she is accompanied by her caretaker and she reports feeling okay. She denies having any F/C or recent infections, though she reports having cold sweats at night. She is taking an iron tablet daily and she reports being constipated from it. She used to take vitamin D 1,000 units daily in the past.   REVIEW OF SYSTEMS:  Review of Systems  Constitutional: Positive for appetite change (50%), diaphoresis (cold sweats) and fatigue (50%). Negative for chills and fever.  Cardiovascular: Positive for leg swelling (ankle swelling).  Gastrointestinal: Positive for abdominal pain (6/10 lower abdominal pain) and constipation (on iron tablets).  Musculoskeletal: Positive for back pain.  Neurological: Positive for numbness.  Psychiatric/Behavioral: Positive for sleep disturbance.    PAST MEDICAL/SURGICAL HISTORY:  Past Medical History:  Diagnosis Date  . Back pain   . Breast pain   . Chronic kidney disease   . Hyperlipidemia   . Hypertension   . Neck pain   . Systemic lupus erythematosus ([place])    Past Surgical History:  Procedure Laterality Date  . ABDOMINAL HYSTERECTOMY    . APPENDECTOMY    . arm surgery Left   . CERVICAL SPINE SURGERY      SOCIAL HISTORY:  Social History   Socioeconomic History  . Marital status: Legally Separated    Spouse name: Not on file  . Number of children: Not on file  . Years of education: Not on file  . Highest education level: Not on file  Occupational History  . Occupation: Disability  Tobacco Use  . Smoking status: Former Smoker    Years: 20.00     Types: Cigarettes    Quit date: 04/14/2020    Years since quitting: 0.1  . Smokeless tobacco: Never Used  Vaping Use  . Vaping Use: Never used  Substance and Sexual Activity  . Alcohol use: No  . Drug use: No  . Sexual activity: Not on file  Other Topics Concern  . Not on file  Social History Narrative  . Not on file   Social Determinants of Health   Financial Resource Strain: Medium Risk  . Difficulty of Paying Living Expenses: Somewhat hard  Food Insecurity: No Food Insecurity  . Worried About Charity fundraiser in the Last Year: Never true  . Ran Out of Food in the Last Year: Never true  Transportation Needs: Unmet Transportation Needs  . Lack of Transportation (Medical): Yes  . Lack of Transportation (Non-Medical): Yes  Physical Activity: Insufficiently Active  . Days of Exercise per Week: 2 days  . Minutes of Exercise per Session: 30 min  Stress: Stress Concern Present  . Feeling of Stress : Rather much  Social Connections: Moderately Integrated  . Frequency of Communication with Friends and Family: More than three times a week  . Frequency of Social Gatherings with Friends and Family: Three times a week  . Attends Religious Services: More than 4 times per year  . Active Member of Clubs or Organizations: Yes  . Attends Archivist Meetings: 1 to 4 times per year  . Marital  Status: Separated  Intimate Partner Violence: Not At Risk  . Fear of Current or Ex-Partner: No  . Emotionally Abused: No  . Physically Abused: No  . Sexually Abused: No    FAMILY HISTORY:  Family History  Problem Relation Age of Onset  . Kidney failure Mother   . Heart attack Father 42  . Heart attack Sister 3    CURRENT MEDICATIONS:  Current Outpatient Medications  Medication Sig Dispense Refill  . amLODipine (NORVASC) 10 MG tablet Take 1 tablet by mouth daily.    Marland Kitchen aspirin EC 81 MG EC tablet Take 1 tablet (81 mg total) by mouth daily.    Marland Kitchen atorvastatin (LIPITOR) 20 MG  tablet Take 20 mg by mouth at bedtime.    . carvedilol (COREG) 6.25 MG tablet TAKE 1 TABLET(6.25 MG) BY MOUTH TWICE DAILY 180 tablet 2  . Cyanocobalamin (VITAMIN B-12) 5000 MCG SUBL Place under the tongue.    Marland Kitchen doxazosin (CARDURA) 1 MG tablet Take 1 mg by mouth daily.    . ferrous sulfate 325 (65 FE) MG tablet Take 1 tablet by mouth daily.    Marland Kitchen gabapentin (NEURONTIN) 300 MG capsule Take 600 mg by mouth as needed.     Marland Kitchen losartan (COZAAR) 100 MG tablet Take 100 mg by mouth daily.    Marland Kitchen ALPRAZolam (XANAX) 0.5 MG tablet Take 0.5 mg by mouth at bedtime as needed for anxiety. (Patient not taking: Reported on 06/12/2020)    . ondansetron (ZOFRAN) 4 MG tablet Take 4 mg by mouth every 8 (eight) hours as needed for nausea or vomiting. (Patient not taking: Reported on 06/12/2020)    . ondansetron (ZOFRAN-ODT) 4 MG disintegrating tablet Take 4 mg by mouth every 8 (eight) hours as needed. (Patient not taking: Reported on 06/12/2020)    . oxyCODONE-acetaminophen (PERCOCET) 10-325 MG tablet TK 1 T PO Q 4 TO 6 H PRN P (Patient not taking: Reported on 06/12/2020)     No current facility-administered medications for this visit.    ALLERGIES:  Allergies  Allergen Reactions  . Ivp Dye [Iodinated Diagnostic Agents] Nausea And Vomiting  . Iodine   . Penicillins Other (See Comments)    Passed out after penicillin injection at 64 years old.   . Sulfa Antibiotics Other (See Comments)    Unknown allergic reaction    PHYSICAL EXAM:  Performance status (ECOG): 1 - Symptomatic but completely ambulatory  Vitals:   06/12/20 1527  BP: (!) 127/49  Pulse: (!) 57  Resp: 18  Temp: (!) 97.2 F (36.2 C)  SpO2: 99%   Wt Readings from Last 3 Encounters:  06/12/20 153 lb 12.8 oz (69.8 kg)  05/07/20 152 lb 14.4 oz (69.4 kg)  05/05/20 150 lb 4 oz (68.2 kg)   Physical Exam Vitals reviewed.  Constitutional:      Appearance: Normal appearance.  Neurological:     General: No focal deficit present.     Mental  Status: She is alert and oriented to person, place, and time.  Psychiatric:        Mood and Affect: Mood normal.        Behavior: Behavior normal.     LABORATORY DATA:  I have reviewed the labs as listed.  CBC Latest Ref Rng & Units 05/07/2020 03/25/2020 08/01/2016  WBC 4.0 - 10.5 K/uL 7.2 7.5 12.3(H)  Hemoglobin 12.0 - 15.0 g/dL 10.5(L) 11.9 11.8(L)  Hematocrit 36.0 - 46.0 % 34.9(L) 39.4 36.7  Platelets 150 - 400 K/uL 264 341 326  CMP Latest Ref Rng & Units 05/07/2020 05/07/2020 03/25/2020  Glucose 70 - 99 mg/dL 92 - -  BUN 8 - 23 mg/dL 66(H) - -  Creatinine 0.44 - 1.00 mg/dL 2.60(H) - -  Sodium 135 - 145 mmol/L 136 - -  Potassium 3.5 - 5.1 mmol/L 4.5 - -  Chloride 98 - 111 mmol/L 103 - -  CO2 22 - 32 mmol/L 25 - -  Calcium 8.7 - 10.3 mg/dL 9.0 9.1 -  Total Protein 6.1 - 8.1 g/dL - - 8.2(H)  Total Bilirubin 0.2 - 1.2 mg/dL - - -  Alkaline Phos 38 - 126 U/L - - -  AST 10 - 35 U/L - - -  ALT 6 - 29 U/L - - -      Component Value Date/Time   RBC 4.85 05/07/2020 1623   MCV 72.0 (L) 05/07/2020 1623   MCH 21.6 (L) 05/07/2020 1623   MCHC 30.1 05/07/2020 1623   RDW 16.6 (H) 05/07/2020 1623   LYMPHSABS 2.8 05/07/2020 1623   MONOABS 0.8 05/07/2020 1623   EOSABS 0.1 05/07/2020 1623   BASOSABS 0.1 05/07/2020 1623   Lab Results  Component Value Date   VD25OH 29.79 (L) 05/07/2020   Lab Results  Component Value Date   LDH 187 05/07/2020   Lab Results  Component Value Date   TOTALPROTELP 7.5 05/07/2020   TOTALPROTELP 7.7 05/07/2020   ALBUMINELP 4.3 05/07/2020   A1GS 0.2 05/07/2020   A2GS 0.7 05/07/2020   BETS 0.9 05/07/2020   BETA2SER 0.3 03/25/2020   GAMS 1.5 05/07/2020   MSPIKE Not Observed 05/07/2020   SPEI Comment 05/07/2020    Lab Results  Component Value Date   KPAFRELGTCHN 100.4 (H) 05/07/2020   LAMBDASER 35.6 (H) 05/07/2020   KAPLAMBRATIO 2.82 (H) 05/07/2020   KAPLAMBRATIO 9.33 05/07/2020   Lab Results  Component Value Date   IGGSERUM 1,523 05/07/2020    IGGSERUM 1,663 (H) 03/25/2020   IGA 183 05/07/2020   IGMSERUM 161 05/07/2020   IGMSERUM 170 03/25/2020     DIAGNOSTIC IMAGING:  I have independently reviewed the scans and discussed with the patient. US THYROID  Result Date: 05/21/2020 CLINICAL DATA:  Thyromegaly exam EXAM: THYROID ULTRASOUND TECHNIQUE: Ultrasound examination of the thyroid gland and adjacent soft tissues was performed. COMPARISON:  None. FINDINGS: Parenchymal Echotexture: Normal Isthmus: 6 mm Right lobe: 5.0 x 2.0 x 2.6 cm Left lobe: 5.2 x 1.8 x 2.1 cm _________________________________________________________ Estimated total number of nodules >/= 1 cm: 0 Number of spongiform nodules >/=  2 cm not described below (TR1): 0 Number of mixed cystic and solid nodules >/= 1.5 cm not described below (TR2): 0 _________________________________________________________ Nodule # 1: Location: Left; Mid Maximum size: 0.9 cm; Other 2 dimensions: 0.8 x 0.8 cm Composition: solid/almost completely solid (2) Echogenicity: isoechoic (1) Shape: not taller-than-wide (0) Margins: ill-defined (0) Echogenic foci: none (0) ACR TI-RADS total points: 3. ACR TI-RADS risk category: TR3 (3 points). ACR TI-RADS recommendations: Given size (<1.4 cm) and appearance, this nodule does NOT meet TI-RADS criteria for biopsy or dedicated follow-up. _________________________________________________________ No hypervascularity or regional adenopathy. IMPRESSION: 0.9 cm left mid thyroid TR 3 nodule does not meet criteria for biopsy or follow-up. No other significant finding by ultrasound The above is in keeping with the ACR TI-RADS recommendations - J Am Coll Radiol 2017;14:587-595. Electronically Signed   By: Jerilynn Mages.  Shick M.D.   On: 05/21/2020 16:34     ASSESSMENT:  1.  Abnormal SPEP: -Patient seen at the request  of Dr. Estanislado Pandy for abnormal SPEP. -SPEP on 03/25/2020 showed poorly defined band of restricted protein mobility in the gamma globulins. -Calcium was 10.2.   Denies any new onset bone pains.  Hemoglobin was 11.9. -Bilateral hip x-rays show right inferior medial joint space narrowing and sclerosis.  No SI joint changes were noted. -Labs on 05/07/2020 shows negative SPEP, negative immunofixation.  Kappa light chains 100.4, lambda light chain 35.6 with ratio of 2.82.  2.  CKD, stage IV: -She is evaluated by Dr. Royce Macadamia at [facility]. -Ultrasound kidneys on 04/15/2020 with increased echogenicity within the renal parenchyma compatible with medical renal disease.  2.1 cm mildly complex cystic lesion at the interpolar left kidney, indeterminate.  3.  Social/family history: -She worked at [employer] in [place], has been on disability since 2002 for left-sided back pain. -She smokes about 1 to 2 cigarettes/day.  Had smoked 2 packs/month at [facility]. -Her mother and daughter had lupus.  Maternal aunt had "stomach cancer".   PLAN:  1.  Abnormal SPEP: -I reviewed results of SPEP which was negative.  Immunofixation was normal. -Free light chain ratio was 2.82 with kappa light chains 100.4 and lambda light chain 35.6. -24-hour urine could not be done. -Discussed lab results with the patient in detail.  The likelihood of plasma cell disorder is very low. -Recommend follow-up in 6 months to check for evolution of plasma cell disorders. -We will also check 24-hour urine at that time along with SPEP.  2.  Microcytic anemia: -CBC showed hemoglobin 10.5 with MCV 72. -Recommend iron tablet daily along with stool softener. -We will check iron panel and CBC at next visit.  Orders placed this encounter:  No orders of the defined types were placed in this encounter.    Derek Jack, MD [place] [phone]   I, Milinda Antis, am acting as a scribe for Dr. Sanda Linger.  I, Derek Jack MD, have reviewed the above documentation for accuracy and completeness, and I agree with the above.

## 2020-06-12 NOTE — Patient Instructions (Signed)
[place] at [facility] Discharge Instructions  You were seen today by Dr. Delton Coombes. He went over your recent results. Purchase a stool softener over the counter and take 2 tablets in the morning and 2 tablets in the evening to prevent your constipation; continue taking 1 iron tablet daily. Start taking vitamin D 1,000 units daily. Dr. Delton Coombes will see you back in 6 months for labs and follow up.   Thank you for choosing [place] at [facility] to provide your oncology and hematology care.  To afford each patient quality time with our provider, please arrive at least 15 minutes before your scheduled appointment time.   If you have a lab appointment with the [facility] please come in thru the Main Entrance and check in at the main information desk  You need to re-schedule your appointment should you arrive 10 or more minutes late.  We strive to give you quality time with our providers, and arriving late affects you and other patients whose appointments are after yours.  Also, if you no show three or more times for appointments you may be dismissed from the clinic at the providers discretion.     Again, thank you for choosing [facility].  Our hope is that these requests will decrease the amount of time that you wait before being seen by our physicians.       _____________________________________________________________  Should you have questions after your visit to [facility], please contact our office at [phone] between the hours of 8:00 a.m. and 4:30 p.m.  Voicemails left after 4:00 p.m. will not be returned until the following business day.  For prescription refill requests, have your pharmacy contact our office and allow 72 hours.    Cancer Center Support Programs:   > Cancer Support Group  2nd Tuesday of the month 1pm-2pm, Journey Room

## 2020-06-16 ENCOUNTER — Other Ambulatory Visit (HOSPITAL_COMMUNITY): Payer: Self-pay

## 2020-06-16 DIAGNOSIS — N184 Chronic kidney disease, stage 4 (severe): Secondary | ICD-10-CM

## 2020-06-16 DIAGNOSIS — D472 Monoclonal gammopathy: Secondary | ICD-10-CM

## 2020-06-24 ENCOUNTER — Encounter: Payer: Self-pay | Admitting: Rheumatology

## 2020-06-24 ENCOUNTER — Telehealth (INDEPENDENT_AMBULATORY_CARE_PROVIDER_SITE_OTHER): Payer: Medicare Other | Admitting: Rheumatology

## 2020-06-24 ENCOUNTER — Other Ambulatory Visit: Payer: Self-pay

## 2020-06-24 DIAGNOSIS — E049 Nontoxic goiter, unspecified: Secondary | ICD-10-CM

## 2020-06-24 DIAGNOSIS — N184 Chronic kidney disease, stage 4 (severe): Secondary | ICD-10-CM | POA: Diagnosis not present

## 2020-06-24 DIAGNOSIS — Z840 Family history of diseases of the skin and subcutaneous tissue: Secondary | ICD-10-CM

## 2020-06-24 DIAGNOSIS — R768 Other specified abnormal immunological findings in serum: Secondary | ICD-10-CM

## 2020-06-24 DIAGNOSIS — M25552 Pain in left hip: Secondary | ICD-10-CM

## 2020-06-24 DIAGNOSIS — G8929 Other chronic pain: Secondary | ICD-10-CM

## 2020-06-24 DIAGNOSIS — M25551 Pain in right hip: Secondary | ICD-10-CM

## 2020-06-24 DIAGNOSIS — Z8679 Personal history of other diseases of the circulatory system: Secondary | ICD-10-CM

## 2020-06-24 DIAGNOSIS — F418 Other specified anxiety disorders: Secondary | ICD-10-CM

## 2020-06-24 DIAGNOSIS — G894 Chronic pain syndrome: Secondary | ICD-10-CM

## 2020-06-24 DIAGNOSIS — M5136 Other intervertebral disc degeneration, lumbar region: Secondary | ICD-10-CM

## 2020-06-24 DIAGNOSIS — M5030 Other cervical disc degeneration, unspecified cervical region: Secondary | ICD-10-CM

## 2020-06-24 DIAGNOSIS — R778 Other specified abnormalities of plasma proteins: Secondary | ICD-10-CM

## 2020-06-24 DIAGNOSIS — E8889 Other specified metabolic disorders: Secondary | ICD-10-CM

## 2020-06-24 DIAGNOSIS — Z8639 Personal history of other endocrine, nutritional and metabolic disease: Secondary | ICD-10-CM

## 2020-06-24 DIAGNOSIS — E785 Hyperlipidemia, unspecified: Secondary | ICD-10-CM

## 2020-06-24 DIAGNOSIS — R002 Palpitations: Secondary | ICD-10-CM

## 2020-06-25 ENCOUNTER — Telehealth: Payer: Self-pay | Admitting: Rheumatology

## 2020-06-25 NOTE — Telephone Encounter (Signed)
I spoke with patient in reference to scheduling a follow up appointment in May. Patient was not completely awake, and will call back when available to schedule a follow up appointment.

## 2020-08-01 ENCOUNTER — Other Ambulatory Visit: Payer: Self-pay | Admitting: Urology

## 2020-08-01 ENCOUNTER — Other Ambulatory Visit (HOSPITAL_COMMUNITY): Payer: Self-pay | Admitting: Urology

## 2020-08-01 DIAGNOSIS — D4102 Neoplasm of uncertain behavior of left kidney: Secondary | ICD-10-CM

## 2020-08-15 ENCOUNTER — Ambulatory Visit (HOSPITAL_COMMUNITY)
Admission: RE | Admit: 2020-08-15 | Discharge: 2020-08-15 | Disposition: A | Discharge status: Home / Self Care | Payer: 59 | Source: Ambulatory Visit | Attending: Urology | Admitting: Urology

## 2020-08-15 ENCOUNTER — Other Ambulatory Visit: Payer: Self-pay

## 2020-08-15 DIAGNOSIS — D4102 Neoplasm of uncertain behavior of left kidney: Secondary | ICD-10-CM | POA: Insufficient documentation

## 2020-08-15 MED ORDER — GADOBUTROL 1 MMOL/ML IV SOLN
7.0000 mL | Freq: Once | INTRAVENOUS | Status: AC | PRN
  Administered 2020-08-15: 7 mL via INTRAVENOUS

## 2020-09-02 ENCOUNTER — Ambulatory Visit: Payer: 59 | Admitting: Cardiology

## 2020-09-22 ENCOUNTER — Other Ambulatory Visit: Payer: Self-pay | Admitting: Urology

## 2020-09-22 ENCOUNTER — Telehealth: Payer: Self-pay | Admitting: Cardiology

## 2020-09-22 DIAGNOSIS — Z01818 Encounter for other preprocedural examination: Secondary | ICD-10-CM

## 2020-09-22 DIAGNOSIS — I10 Essential (primary) hypertension: Secondary | ICD-10-CM

## 2020-09-22 DIAGNOSIS — I6523 Occlusion and stenosis of bilateral carotid arteries: Secondary | ICD-10-CM

## 2020-09-22 DIAGNOSIS — I739 Peripheral vascular disease, unspecified: Secondary | ICD-10-CM

## 2020-09-22 NOTE — Telephone Encounter (Signed)
This is a high risk procedure and since she is a smoker and has PVD with moderate carotid artery stenosis - prefer getting a Lexiscan myoview prior to surgery.  OK to hold ASA as she is on it only for Carotid disease that I can find

## 2020-09-22 NOTE — Telephone Encounter (Signed)
Pre-op request for left partial nephrectomy, scheduled for 12/03/20, asking to hold Aspirin 5 days prior.   H/p atypical chest pain, family history of CAD, CKD stage 4, HTN. In the past stress test has been deferred due to [place]. At her last visit 03/12/20 it was not felt stess test was needed (concerning symptoms had resolved).   Patient has upcoming apt with Dr. Radford Pax 4/28.   Spoke to patient on the phone, since September she has had a lot of stress regarding her kidneys. Also has been going through divorce. Has a lot of stress. She has a brief episode of palpitations triggered mainly by stress. She denies chest pain, sob, orthopnea, pnd. Has some dependent edema but weights steady.   Dr. Radford Pax, can you please comment on the Aspirin and stress test. Thanks.  Please route answer to P CV DIV PREOP

## 2020-09-22 NOTE — Telephone Encounter (Signed)
Orders placed and message sent to scheduling to arrange

## 2020-09-22 NOTE — Addendum Note (Signed)
Addended by: Alvina Filbert B on: 09/22/2020 01:57 PM   Modules accepted: Orders

## 2020-09-22 NOTE — Telephone Encounter (Signed)
Will send to NL triage to schedule pre-op lexiscan Myoview. Thanks

## 2020-09-22 NOTE — Telephone Encounter (Signed)
   [facility] Pre-operative Risk Assessment    Request for surgical clearance:  1. What type of surgery is being performed? Left robic partial nephrectomy  2. When is this surgery scheduled? 12/03/2020  3. What type of clearance is required (medical clearance vs. Pharmacy clearance to hold med vs. Both)? both  4. Are there any medications that need to be held prior to surgery and how long? aspirin 5 days prior  5. Practice name and name of physician performing surgery? [facility]   6. What is your office phone number [phone] ext [phone]   7.   What is your office fax number [phone]  8.   Anesthesia type (None, local, MAC, general) ? general   Milbert Coulter 09/22/2020, 11:31 AM  _________________________________________________________________   (provider comments below)

## 2020-09-23 NOTE — Telephone Encounter (Signed)
Called the patient and Lexiscan Myoview was explained, along with risks and benefits. Patient's gave consent to proceed with procedure.

## 2020-09-23 NOTE — Addendum Note (Signed)
Addended by: Alvina Filbert B on: 09/23/2020 09:31 AM   Modules accepted: Orders

## 2020-09-23 NOTE — Addendum Note (Signed)
Addended by: Antony Madura on: 09/23/2020 03:36 PM   Modules accepted: Orders

## 2020-09-25 ENCOUNTER — Telehealth (HOSPITAL_COMMUNITY): Payer: Self-pay | Admitting: *Deleted

## 2020-09-25 NOTE — Telephone Encounter (Signed)
Patient given detailed instructions per Myocardial Perfusion Study Information Sheet for the test on 10/01/20. Patient notified to arrive 15 minutes early and that it is imperative to arrive on time for appointment to keep from having the test rescheduled.  If you need to cancel or reschedule your appointment, please call the office within 24 hours of your appointment. . Patient verbalized understanding. Patricia Dominguez Jacqueline    

## 2020-10-01 ENCOUNTER — Encounter (HOSPITAL_COMMUNITY): Payer: 59

## 2020-10-02 NOTE — Telephone Encounter (Signed)
Patient is scheduled to see Dr. Radford Pax on 10/09/2020, her nuclear stress test has been set up for 10/13/2020.

## 2020-10-03 NOTE — Telephone Encounter (Signed)
    Patricia Dominguez DOB:  [birth year]  MRN:  [id number]   Primary Cardiologist: Fransico Him, MD  Chart reviewed as part of pre-operative protocol coverage. She has an upcoming visit with Dr. Radford Pax 10/09/20 as well as a NST 10/13/20. Would be reasonable to address preop status at her upcoming visit.   I will route this to Dr. Radford Pax as an FYI and remove from pre-op pool.   Abigail Butts, PA-C 10/03/2020, 9:15 AM

## 2020-10-03 NOTE — Telephone Encounter (Signed)
Rescheduled patient's appointment until after stress test.

## 2020-10-07 ENCOUNTER — Telehealth (HOSPITAL_COMMUNITY): Payer: Self-pay | Admitting: *Deleted

## 2020-10-07 NOTE — Telephone Encounter (Signed)
Patient given detailed instructions per Myocardial Perfusion Study Information Sheet for the test on 10/13/20 at 7:30. Patient notified to arrive 15 minutes early and that it is imperative to arrive on time for appointment to keep from having the test rescheduled.  If you need to cancel or reschedule your appointment, please call the office within 24 hours of your appointment. . Patient verbalized understanding.Patricia Dominguez

## 2020-10-09 ENCOUNTER — Ambulatory Visit: Payer: 59 | Admitting: Cardiology

## 2020-10-13 ENCOUNTER — Ambulatory Visit (HOSPITAL_COMMUNITY): Payer: 59

## 2020-10-21 ENCOUNTER — Encounter (HOSPITAL_COMMUNITY): Payer: Self-pay | Admitting: *Deleted

## 2020-10-21 ENCOUNTER — Other Ambulatory Visit: Payer: Self-pay

## 2020-10-21 ENCOUNTER — Ambulatory Visit (HOSPITAL_COMMUNITY): Payer: 59 | Attending: Cardiology

## 2020-10-21 VITALS — Ht 67.5 in | Wt 153.0 lb

## 2020-10-21 DIAGNOSIS — I6523 Occlusion and stenosis of bilateral carotid arteries: Secondary | ICD-10-CM | POA: Insufficient documentation

## 2020-10-21 DIAGNOSIS — I10 Essential (primary) hypertension: Secondary | ICD-10-CM | POA: Diagnosis not present

## 2020-10-21 DIAGNOSIS — Z01818 Encounter for other preprocedural examination: Secondary | ICD-10-CM | POA: Insufficient documentation

## 2020-10-21 DIAGNOSIS — I739 Peripheral vascular disease, unspecified: Secondary | ICD-10-CM | POA: Diagnosis not present

## 2020-10-21 DIAGNOSIS — Z01810 Encounter for preprocedural cardiovascular examination: Secondary | ICD-10-CM | POA: Diagnosis not present

## 2020-10-21 DIAGNOSIS — R110 Nausea: Secondary | ICD-10-CM | POA: Diagnosis present

## 2020-10-21 MED ORDER — TECHNETIUM TC 99M TETROFOSMIN IV KIT
9.8000 | PACK | Freq: Once | INTRAVENOUS | Status: AC | PRN
  Administered 2020-10-21: 9.8 via INTRAVENOUS
  Filled 2020-10-21: qty 10

## 2020-10-22 ENCOUNTER — Ambulatory Visit (HOSPITAL_COMMUNITY): Payer: 59 | Attending: Cardiovascular Disease

## 2020-10-22 DIAGNOSIS — Z01818 Encounter for other preprocedural examination: Secondary | ICD-10-CM | POA: Insufficient documentation

## 2020-10-22 DIAGNOSIS — I739 Peripheral vascular disease, unspecified: Secondary | ICD-10-CM | POA: Insufficient documentation

## 2020-10-22 DIAGNOSIS — R110 Nausea: Secondary | ICD-10-CM | POA: Insufficient documentation

## 2020-10-22 DIAGNOSIS — I6523 Occlusion and stenosis of bilateral carotid arteries: Secondary | ICD-10-CM | POA: Insufficient documentation

## 2020-10-22 LAB — MYOCARDIAL PERFUSION IMAGING
LV dias vol: 110 mL (ref 46–106)
LV sys vol: 34 mL
Peak HR: 93 {beats}/min
Rest HR: 57 {beats}/min
SDS: 0
SRS: 0
SSS: 0
TID: 1.07

## 2020-10-22 MED ORDER — TECHNETIUM TC 99M TETROFOSMIN IV KIT
32.5000 | PACK | Freq: Once | INTRAVENOUS | Status: AC | PRN
  Administered 2020-10-22: 32.5 via INTRAVENOUS
  Filled 2020-10-22: qty 33

## 2020-10-22 MED ORDER — AMINOPHYLLINE 25 MG/ML IV SOLN
75.0000 mg | Freq: Once | INTRAVENOUS | Status: AC
  Administered 2020-10-22: 150 mg via INTRAVENOUS

## 2020-10-22 MED ORDER — REGADENOSON 0.4 MG/5ML IV SOLN
0.4000 mg | Freq: Once | INTRAVENOUS | Status: AC
  Administered 2020-10-22: 0.4 mg via INTRAVENOUS

## 2020-10-23 ENCOUNTER — Telehealth: Payer: Self-pay | Admitting: *Deleted

## 2020-10-23 NOTE — Telephone Encounter (Signed)
-----   Message from [place], PA-C sent at 10/23/2020  9:42 AM EDT ----- Myoview stress test was low risk, normal, EF normal.   If able, patient needs to be seen in the office for pre-op visit. She is Dr. Jule Economy patient and was supposed to be seen 4/28, but this was canceled.   I will also route to Dr. Radford Pax for further comments.

## 2020-10-23 NOTE — Telephone Encounter (Signed)
Spoke to pt. Notified of Myoview results and provider's recc below.  Pt does have appt with Dr. Radford Pax 12/17/20 but her surgery is scheduled 12/03/20.  Notified pt I will send to scheduling and they will contact pt to schedule appt with Dr. Radford Pax for pre-op visit.   Pt appreciative of good results and has no further questions at this time.

## 2020-10-24 NOTE — Telephone Encounter (Signed)
   Name: Patricia Dominguez  DOB: 1956-04-11  MRN: [id number]   Primary Cardiologist: Fransico Him, MD  Chart reviewed as part of pre-operative protocol coverage. Patient was contacted 10/24/2020 in reference to pre-operative risk assessment for pending surgery as outlined below.  Patricia Dominguez was last seen on 03/12/20 by [name], PA.  Since that day, Patricia Dominguez has done well. She was recommended for 6 month follow up which has been scheduled for 11/05/20 with Dr. Radford Pax.   Previously wore monitor for palpitations 06/2019 but monitor was lost and no results available. She tells me her palpitations occur every 2-3 months and last only seconds. She notices them when she is stressed, but tells me symptoms are over all controlled on her current dose of Carvedilol. We discussed that stress, dehydration, and caffeine are common triggers of palpitations. As symptoms are infrequent and not bothersome, will defer repeat monitor.   She had Lexiscan Myoview 10/22/20 which was low risk study, EF 69%, no evidence of ischemia. She notes she had two episodes of chest discomfort over the last few months which felt like "sharp pain" and resolved after 1-2 seconds. These occurred while walking and self resolved. They have not recurrent since her recent low risk stress test. Tells me she walks daily for exercise without dyspnea and no recurrent chest discomfort. She has an exercise tolerance of >4 METS.   Therefore, based on ACC/AHA guidelines, the patient would be at acceptable risk for the planned procedure without further cardiovascular testing.   Per Dr. Radford Pax as she has no new or worsening symptoms follow up appointment could be deferred but Miss Iseman prefers to keep her appointment as scheduled.  The patient was advised that if she develops new symptoms prior to surgery to contact our office to arrange for a follow-up visit, and she verbalized understanding.  I will route this recommendation to the  requesting party via Epic fax function and remove from pre-op pool. Please call with questions.  Loel Dubonnet, NP 10/24/2020, 9:42 AM

## 2020-10-27 ENCOUNTER — Ambulatory Visit: Payer: 59 | Admitting: Cardiology

## 2020-11-05 ENCOUNTER — Ambulatory Visit (INDEPENDENT_AMBULATORY_CARE_PROVIDER_SITE_OTHER): Payer: 59 | Admitting: Internal Medicine

## 2020-11-05 ENCOUNTER — Other Ambulatory Visit: Payer: Self-pay

## 2020-11-05 ENCOUNTER — Encounter: Payer: Self-pay | Admitting: Radiology

## 2020-11-05 ENCOUNTER — Ambulatory Visit (INDEPENDENT_AMBULATORY_CARE_PROVIDER_SITE_OTHER): Payer: 59 | Admitting: Cardiology

## 2020-11-05 ENCOUNTER — Encounter: Payer: Self-pay | Admitting: Cardiology

## 2020-11-05 ENCOUNTER — Encounter: Payer: Self-pay | Admitting: Internal Medicine

## 2020-11-05 VITALS — BP 146/62 | HR 59 | Ht 67.5 in | Wt 146.4 lb

## 2020-11-05 VITALS — BP 144/52 | HR 60 | Ht 67.0 in | Wt 146.4 lb

## 2020-11-05 DIAGNOSIS — E010 Iodine-deficiency related diffuse (endemic) goiter: Secondary | ICD-10-CM | POA: Diagnosis not present

## 2020-11-05 DIAGNOSIS — Z87898 Personal history of other specified conditions: Secondary | ICD-10-CM

## 2020-11-05 DIAGNOSIS — Z01810 Encounter for preprocedural cardiovascular examination: Secondary | ICD-10-CM

## 2020-11-05 DIAGNOSIS — R002 Palpitations: Secondary | ICD-10-CM | POA: Diagnosis not present

## 2020-11-05 DIAGNOSIS — I10 Essential (primary) hypertension: Secondary | ICD-10-CM

## 2020-11-05 DIAGNOSIS — Z01818 Encounter for other preprocedural examination: Secondary | ICD-10-CM

## 2020-11-05 LAB — TSH: TSH: 0.9 u[IU]/mL (ref 0.35–4.50)

## 2020-11-05 NOTE — Patient Instructions (Signed)
Medication Instructions:  Your physician recommends that you continue on your current medications as directed. Please refer to the Current Medication list given to you today.  *If you need a refill on your cardiac medications before your next appointment, please call your pharmacy*   Testing/Procedures: Your physician has recommended that you wear an event monitor. Event monitors are medical devices that record the heart's electrical activity. Doctors most often [place] these monitors to diagnose arrhythmias. Arrhythmias are problems with the speed or rhythm of the heartbeat. The monitor is a small, portable device. You can wear one while you do your normal daily activities. This is usually used to diagnose what is causing palpitations/syncope (passing out).   Follow-Up: At [facility], you and your health needs are our priority.  As part of our continuing mission to provide you with exceptional heart care, we have created designated Provider Care Teams.  These Care Teams include your primary Cardiologist (physician) and Advanced Practice Providers (APPs -  Physician Assistants and Nurse Practitioners) who all work together to provide you with the care you need, when you need it.  Follow up with Dr. Radford Pax as needed based on results of testing.   [employer] Cardiac Event Monitor Instructions Your physician has requested you wear your cardiac event monitor for _____ days, (1-30). [employer] may call or text to confirm a shipping address. The monitor will be sent to a land address via UPS. [employer] will not ship a monitor to a PO BOX. It typically takes 3-5 days to receive your monitor after it has been enrolled. [employer] will assist with USPS tracking if your package is delayed. The telephone number for [employer] is [phone]. Once you have received your monitor, please review the enclosed instructions. Instruction tutorials can also be viewed under help and settings on the enclosed cell  phone. Your monitor has already been registered assigning a specific monitor serial # to you.  Applying the monitor Remove cell phone from case and turn it on. The cell phone works as Dealer and needs to be within [employer] of you at all times. The cell phone will need to be charged on a daily basis. We recommend you plug the cell phone into the enclosed charger at your bedside table every night.  Monitor batteries: You will receive two monitor batteries labelled #1 and #2. These are your recorders. Plug battery #2 onto the second connection on the enclosed charger. Keep one battery on the charger at all times. This will keep the monitor battery deactivated. It will also keep it fully charged for when you need to switch your monitor batteries. A small light will be blinking on the battery emblem when it is charging. The light on the battery emblem will remain on when the battery is fully charged.  Open package of a Monitor strip. Insert battery #1 into black hood on strip and gently squeeze monitor battery onto connection as indicated in instruction booklet. Set aside while preparing skin.  Choose location for your strip, vertical or horizontal, as indicated in the instruction booklet. Shave to remove all hair from location. There cannot be any lotions, oils, powders, or colognes on skin where monitor is to be applied. Wipe skin clean with enclosed Saline wipe. Dry skin completely.  Peel paper labeled #1 off the back of the Monitor strip exposing the adhesive. Place the monitor on the chest in the vertical or horizontal position shown in the instruction booklet. One arrow on the monitor strip must be pointing  upward. Carefully remove paper labeled #2, attaching remainder of strip to your skin. Try not to create any folds or wrinkles in the strip as you apply it.  Firmly press and release the circle in the center of the monitor battery. You will hear a small beep. This is turning  the monitor battery on. The heart emblem on the monitor battery will light up every 5 seconds if the monitor battery in turned on and connected to the patient securely. Do not push and hold the circle down as this turns the monitor battery off. The cell phone will locate the monitor battery. A screen will appear on the cell phone checking the connection of your monitor strip. This may read poor connection initially but change to good connection within the next minute. Once your monitor accepts the connection you will hear a series of 3 beeps followed by a climbing crescendo of beeps. A screen will appear on the cell phone showing the two monitor strip placement options. Touch the picture that demonstrates where you applied the monitor strip.  Your monitor strip and battery are waterproof. You are able to shower, bathe, or swim with the monitor on. They just ask you do not submerge deeper than 3 feet underwater. We recommend removing the monitor if you are swimming in a lake, river, or ocean.  Your monitor battery will need to be switched to a fully charged monitor battery approximately once a week. The cell phone will alert you of an action which needs to be made.  On the cell phone, tap for details to reveal connection status, monitor battery status, and cell phone battery status. The green dots indicates your monitor is in good status. A red dot indicates there is something that needs your attention.  To record a symptom, click the circle on the monitor battery. In 30-60 seconds a list of symptoms will appear on the cell phone. Select your symptom and tap save. Your monitor will record a sustained or significant arrhythmia regardless of you clicking the button. Some patients do not feel the heart rhythm irregularities. [employer] will notify us of any serious or critical events.  Refer to instruction booklet for instructions on switching batteries, changing strips, the Do not disturb or  Pause features, or any additional questions.  Call [employer] at [phone], to confirm your monitor is transmitting and record your baseline. They will answer any questions you may have regarding the monitor instructions at that time.  Returning the monitor to [place] all equipment back into blue box. Peel off strip of paper to expose adhesive and close box securely. There is a prepaid UPS shipping label on this box. Drop in a UPS drop box, or at a UPS facility like [employer]. You may also contact [employer] to arrange UPS to pick up monitor package at your home.

## 2020-11-05 NOTE — Progress Notes (Signed)
Date:  11/05/2020   ID:  Patricia Dominguez, DOB 12/20/1955, MRN [id number]   PCP:  [name], DO  Cardiologist:  Fransico Him, MD  Electrophysiologist:  None   Chief Complaint:  Chest pain  History of Present Illness:    Patricia Dominguez is a 65 y.o. female with a hx of atypical chest pain tender to palpation over her left chest wall that initially started in 2018.CRF included remote tobacco use, family history of CAD and MI at an early age in her father and hypertension.  2-D echo 07/2016 showed normal LVEF 60-65% with no wall motion abnormality.No stress test recommendedas her CP was felt to be musculoskeletal.Statin was stopped and beta blocker was stopped because of bradycardia. She saw [name], PA back a few months later and was still have some mild CP and BP was elevated. Her Losartan was increased to 50mg  BID and was referred to pain management. ETT was recommended but she never followed through.   She was seen back for virtual visit in April 2020 with complaints of recurrent chest pain.  The pain was different from what she had before in some respects but and others is the same.  There was some sharp shooting pains but then she also felt like something was squeezing her heart inside.  She would notice her heart racing at times and beating irregular.  She thhought she may be having panic attacks because if she would sit down and rests and take Xanax her symptoms seem to improve.  Of concern though she was getting nauseated and sometimes vomited with the chest discomfort.  She denied any radiation of the pain or diaphoresis.  Her blood pressures had not been well controlled as well.  A Lexiscan myoview was ordered but patient never followed through.    She was seen back by me and was still having chest pain that was sporadic and she was very concerned something was wrong with her heart.  She cancelled her nuclear stress test several times due to concern over getting COVID.   She wore a Ziopatch a few months ago for palpitations but we never go the report.  She ultimately underwent nuclear stress test a week ago and was normal with no ischemia.    She is here today for followup and is doing well.  She denies any chest pain or pressure, PND, orthopnea, LE edema, dizziness or syncope. She has SOB only when she gets anxious. She still complains of palpitations that are very sporadic.  She is compliant with her meds and is tolerating meds with no SE.     Prior CV studies:   The following studies were reviewed today:  2D echo  Past Medical History:  Diagnosis Date  . Back pain   . Breast pain   . Chronic kidney disease   . Hyperlipidemia   . Hypertension   . Neck pain   . Systemic lupus erythematosus ([place])    Past Surgical History:  Procedure Laterality Date  . ABDOMINAL HYSTERECTOMY    . APPENDECTOMY    . arm surgery Left   . CERVICAL SPINE SURGERY       Current Meds  Medication Sig  . ALPRAZolam (XANAX) 0.5 MG tablet Take 0.5 mg by mouth at bedtime as needed for anxiety.  Marland Kitchen amLODipine (NORVASC) 10 MG tablet Take 1 tablet by mouth daily.  Marland Kitchen aspirin EC 81 MG EC tablet Take 1 tablet (81 mg total) by mouth daily.  Marland Kitchen atorvastatin (LIPITOR) 20  MG tablet Take 20 mg by mouth at bedtime.  . carvedilol (COREG) 6.25 MG tablet TAKE 1 TABLET(6.25 MG) BY MOUTH TWICE DAILY  . Cholecalciferol (VITAMIN D3 PO) Take by mouth.  . Cyanocobalamin (VITAMIN B-12) 5000 MCG SUBL Place under the tongue.  Marland Kitchen doxazosin (CARDURA) 1 MG tablet Take 1 mg by mouth daily.  . ferrous sulfate 325 (65 FE) MG tablet Take 1 tablet by mouth daily.  . furosemide (LASIX) 40 MG tablet Take 40 mg by mouth.  . gabapentin (NEURONTIN) 300 MG capsule Take 600 mg by mouth as needed.   . hydrALAZINE (APRESOLINE) 100 MG tablet Take 100 mg by mouth 2 (two) times daily.  . ondansetron (ZOFRAN) 4 MG tablet Take 4 mg by mouth every 8 (eight) hours as needed for nausea or vomiting.  . ondansetron  (ZOFRAN-ODT) 4 MG disintegrating tablet Take 4 mg by mouth every 8 (eight) hours as needed.  Marland Kitchen oxyCODONE-acetaminophen (PERCOCET) 10-325 MG tablet   . sodium bicarbonate 650 MG tablet Take 650 mg by mouth 3 (three) times daily.     Allergies:   Ivp dye [iodinated diagnostic agents], Iodine, Penicillins, and Sulfa antibiotics   Social History   Tobacco Use  . Smoking status: Current Some Day Smoker    Years: 20.00    Types: Cigarettes    Last attempt to quit: 04/14/2020    Years since quitting: 0.5  . Smokeless tobacco: Never Used  . Tobacco comment: 1 cig some days  Vaping Use  . Vaping Use: Never used  Substance Use Topics  . Alcohol use: No  . Drug use: No     Family Hx: The patient's family history includes Heart attack (age of onset: 75) in her sister; Heart attack (age of onset: 50) in her father; Kidney failure in her mother.  ROS:   Please see the history of present illness.     All other systems reviewed and are negative.   Labs/Other Tests and Data Reviewed:    Recent Labs: 03/25/2020: ALT 21 05/05/2020: TSH 1.39 05/07/2020: BUN 66; Creatinine, Ser 2.60; Hemoglobin 10.5; Magnesium 2.1; Platelets 264; Potassium 4.5; Sodium 136   Recent Lipid Panel No results found for: CHOL, TRIG, HDL, CHOLHDL, LDLCALC, LDLDIRECT  Wt Readings from Last 3 Encounters:  11/05/20 146 lb 6.4 oz (66.4 kg)  10/21/20 153 lb (69.4 kg)  06/12/20 153 lb 12.8 oz (69.8 kg)     Objective:    Vital Signs:  BP (!) 144/52   Pulse 60   Ht 5\' 7"  (1.702 m)   Wt 146 lb 6.4 oz (66.4 kg)   BMI 22.93 kg/m    GEN: Well nourished, well developed in no acute distress HEENT: Normal NECK: No JVD; No carotid bruits LYMPHATICS: No lymphadenopathy CARDIAC:RRR, no murmurs, rubs, gallops RESPIRATORY:  Clear to auscultation without rales, wheezing or rhonchi  ABDOMEN: Soft, non-tender, non-distended MUSCULOSKELETAL:  No edema; No deformity  SKIN: Warm and dry NEUROLOGIC:  Alert and oriented x  3 PSYCHIATRIC:  Normal affect   ASSESSMENT & PLAN:    1.  Chest pain -this has been atypical and appears to have resolved -nuclear stress test 10/2020 showed no ischemia  2.  HTN -BP is borderline controlled on exam today -Continue prescription drug management with amlodipine 10mg  daily, Carvedilol 6.25mg  BID, Hydralazine 100mg  BID, Cardura 1mg  daily  -I instructed her to limit her Na intake to < 2gm daily   3.  Palpitations -these are sporadic>>they occur every so many weeks -she says  that she did the heart monitor and turned it back but there is no report that can be found  -I will get a 30 day event monitor to reassess  4.  Preoperative Clearance -she is at low risk of perioperative cardiac major cardiac events at 0.9% risk.   Medication Adjustments/Labs and Tests Ordered: Current medicines are reviewed at length with the patient today.  Concerns regarding medicines are outlined above.  Tests Ordered: No orders of the defined types were placed in this encounter.  Medication Changes: No orders of the defined types were placed in this encounter.   Disposition:  Follow up prn  Signed, Fransico Him, MD  11/05/2020 11:13 AM    [facility]

## 2020-11-05 NOTE — Addendum Note (Signed)
Addended by: Antonieta Iba on: 11/05/2020 11:17 AM   Modules accepted: Orders

## 2020-11-05 NOTE — Progress Notes (Signed)
Enrolled patient for a 30 day [employer] event monitor to be mailed to patients home

## 2020-11-05 NOTE — Patient Instructions (Signed)
-   Please have annual thyroid blood test through your Primary care physician - NO need for serial thyroid  Ultrasound unless there's a clinical concerns about left thyroid nodule enlargement

## 2020-11-05 NOTE — Progress Notes (Signed)
Name: Patricia Dominguez  MRN/ DOB: [id number], Aug 28, 1955    Age/ Sex: 65 y.o., female     PCP: Patricia Muskrat, DO   Reason for Endocrinology Evaluation: Thyromegaly      Initial Endocrinology Clinic Visit: 05/05/2020    PATIENT IDENTIFIER: Patricia Dominguez is a 65 y.o., female with a past medical history of HTN, CKD IV  and Positive ANA . She has followed with [facility] since 11/05/2020 for consultative assistance with management of her thyromegaly .   HISTORICAL SUMMARY:  [name] during follow up with cardiology in 02/2020. TSH was normal at 1.720 uIU/mL with normal FT3 but elevated FT4 at 1.8 ng/dL.   NO FH of thyroid disease   SUBJECTIVE:    Today (11/05/2020):  Patricia Dominguez is here for thyromegaly, accompanied by friend Patricia Dominguez.  She denies neck pain , has occasional dysphagia  No change in size   Has noted weight loss due to stress  Denies loose stools or diarrhea  Denies tremors       HISTORY:  Past Medical History:  Past Medical History:  Diagnosis Date  . Back pain   . Breast pain   . Chronic kidney disease   . Hyperlipidemia   . Hypertension   . Neck pain   . Systemic lupus erythematosus ([place])     Past Surgical History:  Past Surgical History:  Procedure Laterality Date  . ABDOMINAL HYSTERECTOMY    . APPENDECTOMY    . arm surgery Left   . CERVICAL SPINE SURGERY       Social History:  reports that she has been smoking cigarettes. She has smoked for the past 20.00 years. She has never used smokeless tobacco. She reports that she does not drink alcohol and does not use drugs. Family History:  Family History  Problem Relation Age of Onset  . Kidney failure Mother   . Heart attack Father 71  . Heart attack Sister 73      HOME MEDICATIONS: Allergies as of 11/05/2020      Reactions   Ivp Dye [iodinated Diagnostic Agents] Nausea And Vomiting   Iodine    Penicillins Other (See Comments)    Passed out after penicillin injection at 65 years old.    Sulfa Antibiotics Other (See Comments)   Unknown allergic reaction      Medication List       Accurate as of Nov 05, 2020  9:53 AM. If you have any questions, ask your nurse or doctor.        ALPRAZolam 0.5 MG tablet Commonly known as: XANAX Take 0.5 mg by mouth at bedtime as needed for anxiety.   amLODipine 10 MG tablet Commonly known as: NORVASC Take 1 tablet by mouth daily.   aspirin 81 MG EC tablet Take 1 tablet (81 mg total) by mouth daily.   atorvastatin 20 MG tablet Commonly known as: LIPITOR Take 20 mg by mouth at bedtime.   carvedilol 6.25 MG tablet Commonly known as: COREG TAKE 1 TABLET(6.25 MG) BY MOUTH TWICE DAILY   doxazosin 1 MG tablet Commonly known as: CARDURA Take 1 mg by mouth daily.   ferrous sulfate 325 (65 FE) MG tablet Take 1 tablet by mouth daily.   furosemide 40 MG tablet Commonly known as: LASIX Take 40 mg by mouth.   gabapentin 300 MG capsule Commonly known as: NEURONTIN Take 600 mg by mouth as needed.   hydrALAZINE 100 MG tablet Commonly known  as: APRESOLINE Take 100 mg by mouth 2 (two) times daily.   losartan 100 MG tablet Commonly known as: COZAAR Take 100 mg by mouth daily.   ondansetron 4 MG disintegrating tablet Commonly known as: ZOFRAN-ODT Take 4 mg by mouth every 8 (eight) hours as needed.   ondansetron 4 MG tablet Commonly known as: ZOFRAN Take 4 mg by mouth every 8 (eight) hours as needed for nausea or vomiting.   oxyCODONE-acetaminophen 10-325 MG tablet Commonly known as: PERCOCET   sodium bicarbonate 650 MG tablet Take 650 mg by mouth 3 (three) times daily.   Vitamin B-12 5000 MCG Subl Place under the tongue.   VITAMIN D3 PO Take by mouth.         OBJECTIVE:   PHYSICAL EXAM: VS: BP (!) 146/62   Pulse (!) 59   Ht 5' 7.5" (1.715 m)   Wt 146 lb 6 oz (66.4 kg)   SpO2 98%   BMI 22.59 kg/m    EXAM: General: Pt appears well and is in NAD   Neck: General: Supple without adenopathy. Thyroid:   No nodules appreciated.  Lungs: Clear with good BS bilat with no rales, rhonchi, or wheezes  Heart: Auscultation: RRR.  Abdomen: Normoactive bowel sounds, soft, nontender, without masses or organomegaly palpable  Extremities:  BL LE: No pretibial edema normal ROM and strength.  Skin: Hair: Texture and amount normal with gender appropriate distribution Skin Inspection: No rashes Skin Palpation: Skin temperature, texture, and thickness normal to palpation  Neuro: Cranial nerves: II - XII grossly intact  Motor: Normal strength throughout DTRs: 2+ and symmetric in UE without delay in relaxation phase  Mental Status: Judgment, insight: Intact Orientation: Oriented to time, place, and person Mood and affect: No depression, anxiety, or agitation     DATA REVIEWED:  Results for Patricia Dominguez (MRN [id number]) as of 11/06/2020 08:02  Ref. Range 11/05/2020 12:13  TSH Latest Ref Range: 0.35 - 4.50 uIU/mL 0.90     Thyroid Ultrasound  Nodule # 1:  Location: Left; Mid  Maximum size: 0.9 cm; Other 2 dimensions: 0.8 x 0.8 cm  Composition: solid/almost completely solid (2)  Echogenicity: isoechoic (1)  Shape: not taller-than-wide (0)  Margins: ill-defined (0)  Echogenic foci: none (0)  ACR TI-RADS total points: 3.  ACR TI-RADS risk category: TR3 (3 points).  ACR TI-RADS recommendations:  Given size (<1.4 cm) and appearance, this nodule does NOT meet TI-RADS criteria for biopsy or dedicated follow-up.  _________________________________________________________  No hypervascularity or regional adenopathy.  IMPRESSION: 0.9 cm left mid thyroid TR 3 nodule does not meet criteria for biopsy or follow-up.  No other significant finding by ultrasound   ASSESSMENT / PLAN / RECOMMENDATIONS:   1. Thyroid nodule :   -She has been noted with a subcentimeter left mid thyroid nodule that does not meet criteria for a  biopsy or a follow-up -No local neck symptoms related to the thyroid, she does have other left-sided symptoms that could be attributed to a muscle spasm or lymph nodes. -Repeat TSH is normal -She has no clinical symptoms of hypo or hyperthyroid -I have recommended follow-up with PCP moving forward with annual TFTs as well as neck exams, if there is any clinical concern about her left thyroid nodule or new nodules , would recommend proceeding with a repeat ultrasound at the time.  Follow-up as needed    Signed electronically by: Mack Guise, MD  [facility]  [facility] [address]  [place], [place] [address] Phone: [phone] FAX: [phone]      CC: Patricia Muskrat, DO No address on file Phone: None  Fax: [phone]   Return to Endocrinology clinic as below: Future Appointments  Date Time Provider [place]  11/05/2020 10:20 AM Sueanne Margarita, MD [facility]  11/05/2020 11:10 AM Kaoru Benda, Melanie Crazier, MD [facility] None  11/25/2020 11:00 AM [facility] PAT 2 [facility] None  12/03/2020  2:30 PM [facility] LAB [facility] None  12/10/2020  3:30 PM Derek Jack, MD [facility] None

## 2020-11-10 ENCOUNTER — Ambulatory Visit (INDEPENDENT_AMBULATORY_CARE_PROVIDER_SITE_OTHER): Payer: Medicaid - Out of State

## 2020-11-10 DIAGNOSIS — R002 Palpitations: Secondary | ICD-10-CM

## 2020-11-12 NOTE — Progress Notes (Signed)
DUE TO COVID-19 ONLY ONE VISITOR IS ALLOWED TO COME WITH YOU AND STAY IN THE WAITING ROOM ONLY DURING PRE OP AND PROCEDURE DAY OF SURGERY. THE 1 VISITOR  MAY VISIT WITH YOU AFTER SURGERY IN YOUR PRIVATE ROOM DURING VISITING HOURS ONLY!  YOU NEED TO HAVE A COVID 19 TEST ON___6/20/2022 _______, THIS TEST MUST BE DONE BEFORE SURGERY,  COVID TESTING SITE [address] [place] [address], IT IS ON THE RIGHT GOING OUT [address] APPROXIMATELY  2 MINUTES PAST [employer] ON THE RIGHT. ONCE YOUR COVID TEST IS COMPLETED,  PLEASE BEGIN THE QUARANTINE INSTRUCTIONS AS OUTLINED IN YOUR HANDOUT.                Patricia Dominguez  11/12/2020   Your procedure is scheduled on:  12/03/20  Report to [facility] Main  Entrance   Report to admitting at    [place] AM     Call this number if you have problems the morning of surgery [phone]    Remember: Do not eat food , candy gum or mints :After Midnight. You may have clear liquids from midnight until 0415am     CLEAR LIQUID DIET   Foods Allowed                                                                       Coffee and tea, regular and decaf                              Plain Jell-O any favor except red or purple                                            Fruit ices (not with fruit pulp)                                      Iced Popsicles                                     Carbonated beverages, regular and diet                                    Cranberry, grape and apple juices Sports drinks like Gatorade Lightly seasoned clear broth or consume(fat free) Sugar, honey syrup   _____________________________________________________________________    BRUSH YOUR TEETH MORNING OF SURGERY AND RINSE YOUR MOUTH OUT, NO CHEWING GUM CANDY OR MINTS.     Take these medicines the morning of surgery with A SIP OF WATER:  Hydralazine, Cardura, coreg, amlodipine, Socium bicarbonate    DO NOT TAKE ANY DIABETIC MEDICATIONS DAY  OF YOUR SURGERY                               You may not have  any metal on your body including hair pins and              piercings  Do not wear jewelry, make-up, lotions, powders or perfumes, deodorant             Do not wear nail polish on your fingernails.  Do not shave  48 hours prior to surgery.              Men may shave face and neck.   Do not bring valuables to the hospital. [place].  Contacts, dentures or bridgework may not be worn into surgery.  Leave suitcase in the car. After surgery it may be brought to your room.     Patients discharged the day of surgery will not be allowed to drive home. IF YOU ARE HAVING SURGERY AND GOING HOME THE SAME DAY, YOU MUST HAVE AN ADULT TO DRIVE YOU HOME AND BE WITH YOU FOR 24 HOURS. YOU MAY GO HOME BY TAXI OR UBER OR ORTHERWISE, BUT AN ADULT MUST ACCOMPANY YOU HOME AND STAY WITH YOU FOR 24 HOURS.  Name and phone number of your driver:  Special Instructions: N/A              Please read over the following fact sheets you were given: _____________________________________________________________________  [facility] - Preparing for Surgery Before surgery, you can play an important role.  Because skin is not sterile, your skin needs to be as free of germs as possible.  You can reduce the number of germs on your skin by washing with CHG (chlorahexidine gluconate) soap before surgery.  CHG is an antiseptic cleaner which kills germs and bonds with the skin to continue killing germs even after washing. Please DO NOT use if you have an allergy to CHG or antibacterial soaps.  If your skin becomes reddened/irritated stop using the CHG and inform your nurse when you arrive at Short Stay. Do not shave (including legs and underarms) for at least 48 hours prior to the first CHG shower.  You may shave your face/neck. Please follow these instructions carefully:  1.  Shower with CHG Soap the night before surgery  and the  morning of Surgery.  2.  If you choose to wash your hair, wash your hair first as usual with your  normal  shampoo.  3.  After you shampoo, rinse your hair and body thoroughly to remove the  shampoo.                           4.  Use CHG as you would any other liquid soap.  You can apply chg directly  to the skin and wash                       Gently with a scrungie or clean washcloth.  5.  Apply the CHG Soap to your body ONLY FROM THE NECK DOWN.   Do not use on face/ open                           Wound or open sores. Avoid contact with eyes, ears mouth and genitals (private parts).                       Wash  face,  Genitals (private parts) with your normal soap.             6.  Wash thoroughly, paying special attention to the area where your surgery  will be performed.  7.  Thoroughly rinse your body with warm water from the neck down.  8.  DO NOT shower/wash with your normal soap after using and rinsing off  the CHG Soap.                9.  Pat yourself dry with a clean towel.            10.  Wear clean pajamas.            11.  Place clean sheets on your bed the night of your first shower and do not  sleep with pets. Day of Surgery : Do not apply any lotions/deodorants the morning of surgery.  Please wear clean clothes to the hospital/surgery center.  FAILURE TO FOLLOW THESE INSTRUCTIONS MAY RESULT IN THE CANCELLATION OF YOUR SURGERY PATIENT SIGNATURE_________________________________  NURSE SIGNATURE__________________________________  ________________________________________________________________________

## 2020-11-25 ENCOUNTER — Encounter (HOSPITAL_COMMUNITY)
Admission: RE | Admit: 2020-11-25 | Discharge: 2020-11-25 | Disposition: A | Discharge status: Home / Self Care | Payer: 59 | Source: Ambulatory Visit | Attending: Anesthesiology | Admitting: Anesthesiology

## 2020-12-03 ENCOUNTER — Ambulatory Visit: Admit: 2020-12-03 | Payer: 59 | Admitting: Urology

## 2020-12-03 ENCOUNTER — Inpatient Hospital Stay (HOSPITAL_COMMUNITY): Payer: 59 | Attending: Hematology

## 2020-12-03 SURGERY — NEPHRECTOMY, PARTIAL, ROBOT-ASSISTED
Anesthesia: General | Laterality: Left

## 2020-12-10 ENCOUNTER — Ambulatory Visit (HOSPITAL_COMMUNITY): Payer: Medicare Other | Admitting: Hematology and Oncology

## 2020-12-16 ENCOUNTER — Encounter: Payer: Self-pay | Admitting: Cardiology

## 2020-12-16 DIAGNOSIS — I493 Ventricular premature depolarization: Secondary | ICD-10-CM | POA: Insufficient documentation

## 2020-12-17 ENCOUNTER — Ambulatory Visit: Payer: 59 | Admitting: Cardiology

## 2020-12-18 ENCOUNTER — Telehealth: Payer: Self-pay

## 2020-12-18 ENCOUNTER — Telehealth: Payer: Self-pay | Admitting: Cardiology

## 2020-12-18 DIAGNOSIS — R002 Palpitations: Secondary | ICD-10-CM

## 2020-12-18 DIAGNOSIS — I493 Ventricular premature depolarization: Secondary | ICD-10-CM

## 2020-12-18 NOTE — Telephone Encounter (Signed)
See above note

## 2020-12-18 NOTE — Telephone Encounter (Signed)
The patient has been notified of the result and verbalized understanding.  All questions (if any) were answered. Antonieta Iba, RN 12/18/2020 12:19 PM  Lab work and echocardiogram have been ordered.

## 2020-12-18 NOTE — Telephone Encounter (Signed)
Patient returning call for monitor results. 

## 2020-12-18 NOTE — Telephone Encounter (Signed)
-----   Message from Patricia Margarita, MD sent at 12/16/2020  1:08 PM EDT ----- Heart monitor showed occasional extra heart beats from the bottom of the heart which are benign. Please get a 2D echo for PVCs and have her come in for a BMET, Mag and TSH

## 2020-12-24 ENCOUNTER — Other Ambulatory Visit (HOSPITAL_COMMUNITY): Payer: Self-pay

## 2020-12-24 DIAGNOSIS — D472 Monoclonal gammopathy: Secondary | ICD-10-CM

## 2020-12-24 DIAGNOSIS — N184 Chronic kidney disease, stage 4 (severe): Secondary | ICD-10-CM

## 2020-12-24 NOTE — Addendum Note (Signed)
Addended by: Candis Musa on: 12/24/2020 08:58 AM   Modules accepted: Orders

## 2020-12-24 NOTE — Addendum Note (Signed)
Addended by: Candis Musa on: 12/24/2020 09:00 AM   Modules accepted: Orders

## 2020-12-25 ENCOUNTER — Other Ambulatory Visit (HOSPITAL_COMMUNITY): Payer: Self-pay | Admitting: *Deleted

## 2020-12-25 ENCOUNTER — Other Ambulatory Visit (HOSPITAL_COMMUNITY): Payer: Self-pay

## 2020-12-25 DIAGNOSIS — N184 Chronic kidney disease, stage 4 (severe): Secondary | ICD-10-CM

## 2020-12-25 DIAGNOSIS — D472 Monoclonal gammopathy: Secondary | ICD-10-CM

## 2020-12-25 NOTE — Addendum Note (Signed)
Addended by: Candis Musa on: 12/25/2020 01:05 PM   Modules accepted: Orders

## 2021-01-07 ENCOUNTER — Ambulatory Visit (HOSPITAL_COMMUNITY): Payer: 59 | Attending: Internal Medicine

## 2021-01-07 ENCOUNTER — Telehealth: Payer: Self-pay | Admitting: Cardiology

## 2021-01-07 ENCOUNTER — Other Ambulatory Visit: Payer: Self-pay

## 2021-01-07 ENCOUNTER — Other Ambulatory Visit: Payer: 59

## 2021-01-07 ENCOUNTER — Other Ambulatory Visit: Payer: Self-pay | Admitting: *Deleted

## 2021-01-07 DIAGNOSIS — F17200 Nicotine dependence, unspecified, uncomplicated: Secondary | ICD-10-CM | POA: Insufficient documentation

## 2021-01-07 DIAGNOSIS — I493 Ventricular premature depolarization: Secondary | ICD-10-CM

## 2021-01-07 DIAGNOSIS — E785 Hyperlipidemia, unspecified: Secondary | ICD-10-CM | POA: Insufficient documentation

## 2021-01-07 DIAGNOSIS — I080 Rheumatic disorders of both mitral and aortic valves: Secondary | ICD-10-CM

## 2021-01-07 DIAGNOSIS — I119 Hypertensive heart disease without heart failure: Secondary | ICD-10-CM | POA: Insufficient documentation

## 2021-01-07 DIAGNOSIS — R002 Palpitations: Secondary | ICD-10-CM | POA: Diagnosis not present

## 2021-01-07 LAB — ECHOCARDIOGRAM COMPLETE
AR max vel: 1.84 cm2
AV Area VTI: 2.04 cm2
AV Area mean vel: 1.93 cm2
AV Mean grad: 9 mmHg
AV Peak grad: 18.9 mmHg
Ao pk vel: 2.17 m/s
Area-P 1/2: 3.39 cm2
S' Lateral: 2.6 cm

## 2021-01-07 IMAGING — US US RENAL
1 series · 13 of 25 positions shown · non-contrast
Comparison: None available.

CLINICAL DATA: Initial evaluation for chronic kidney disease stage
4.

EXAM:
RENAL / URINARY TRACT ULTRASOUND COMPLETE

[Series 1: us renal · 0.20mm/px · 13 of 37 slices shown]
[im 1/37]
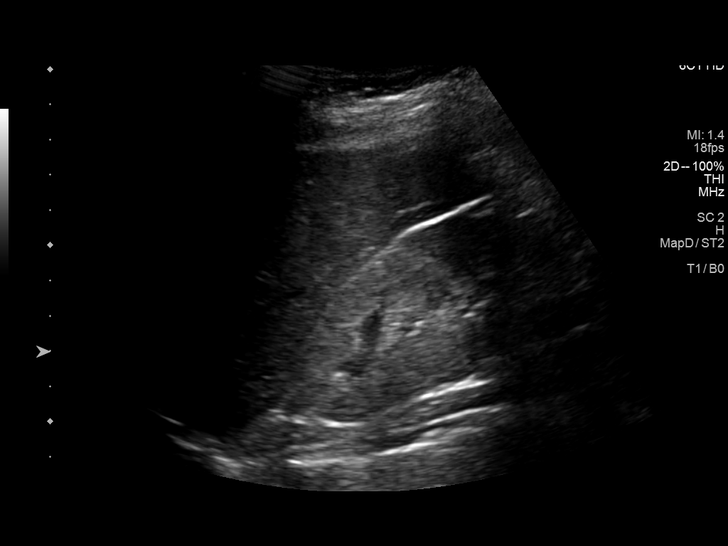
[im 4/37]
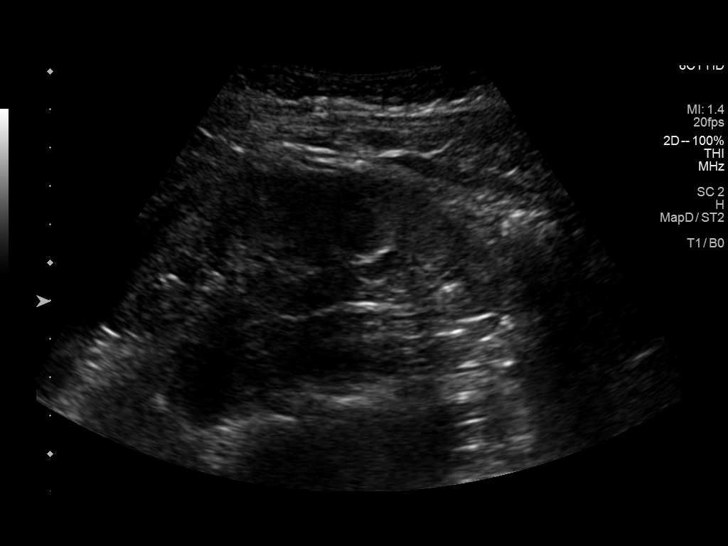
[im 7/37]
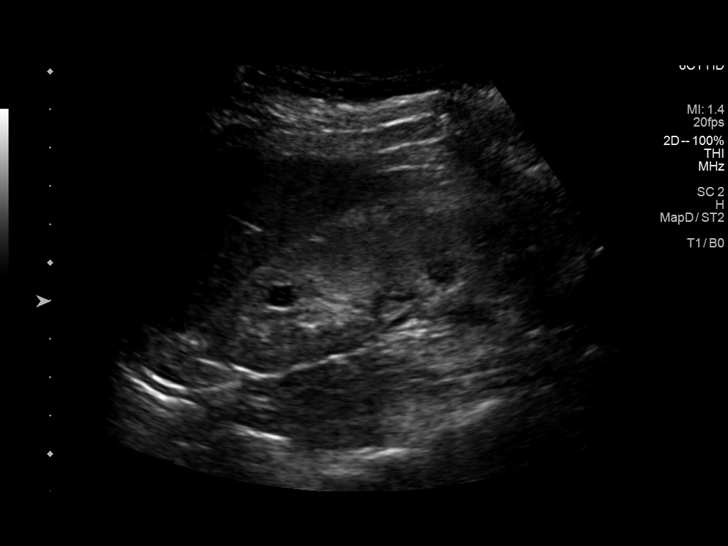
[im 10/37]
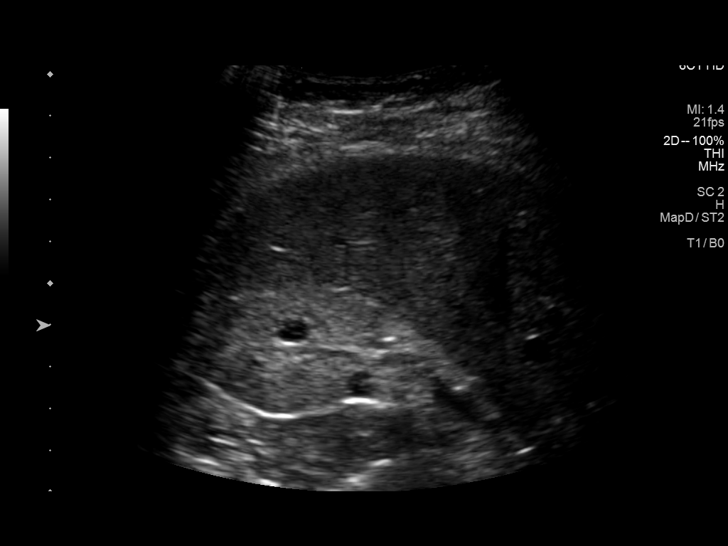
[im 13/37]
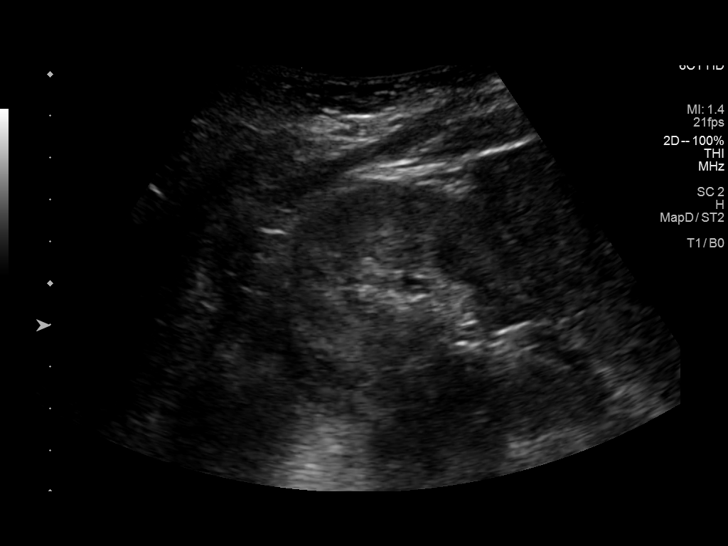
[im 16/37]
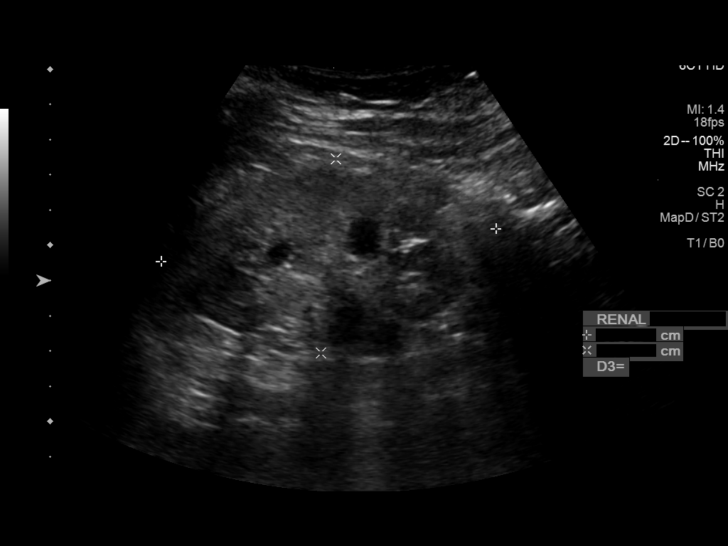
[im 19/37]
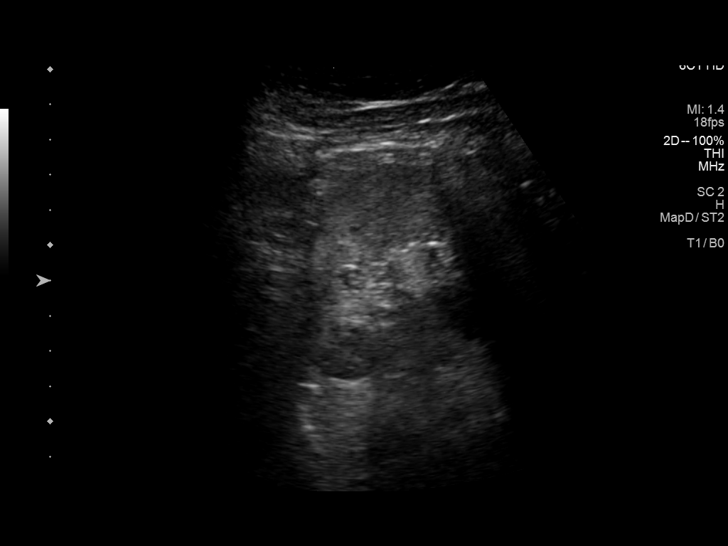
[im 22/37]
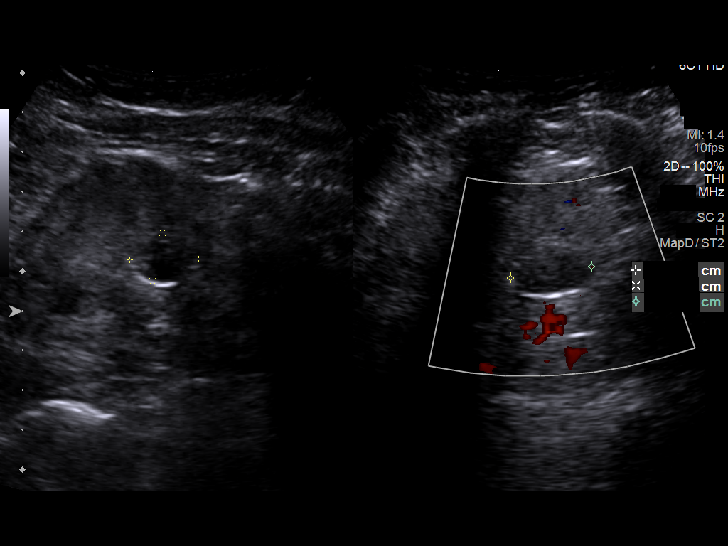
[im 25/37]
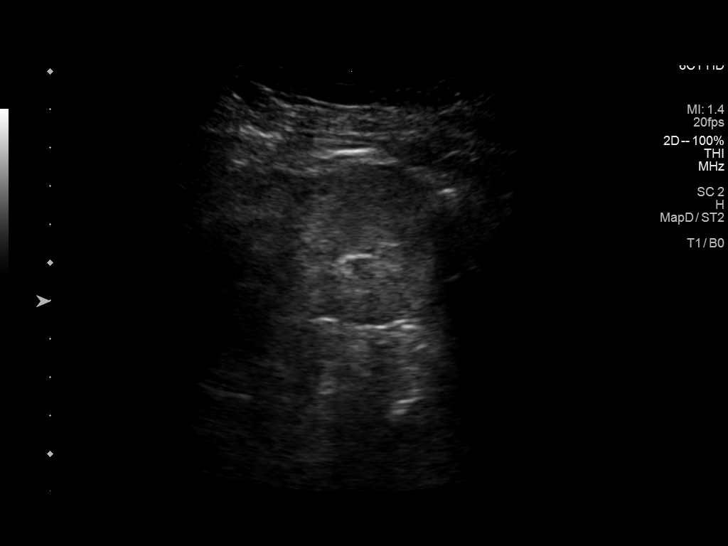
[im 28/37]
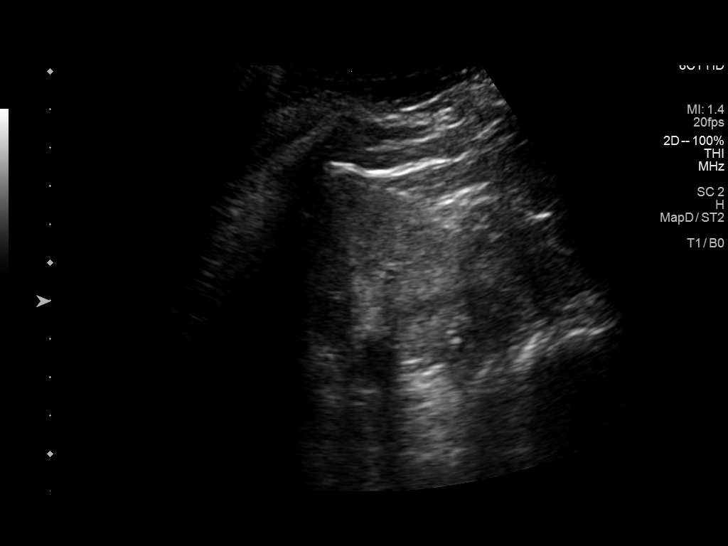
[im 31/37]
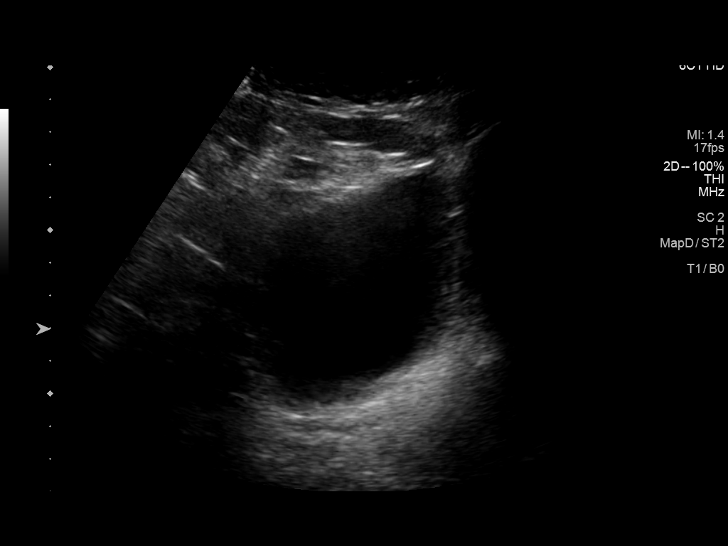
[im 34/37]
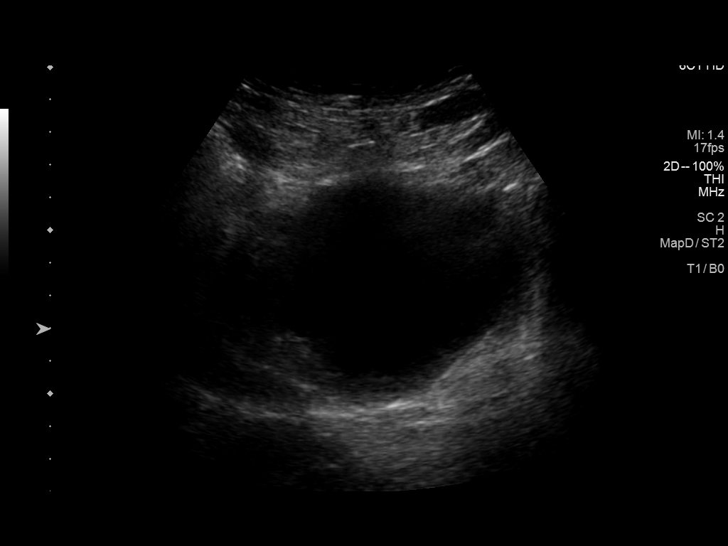
[im 37/37]
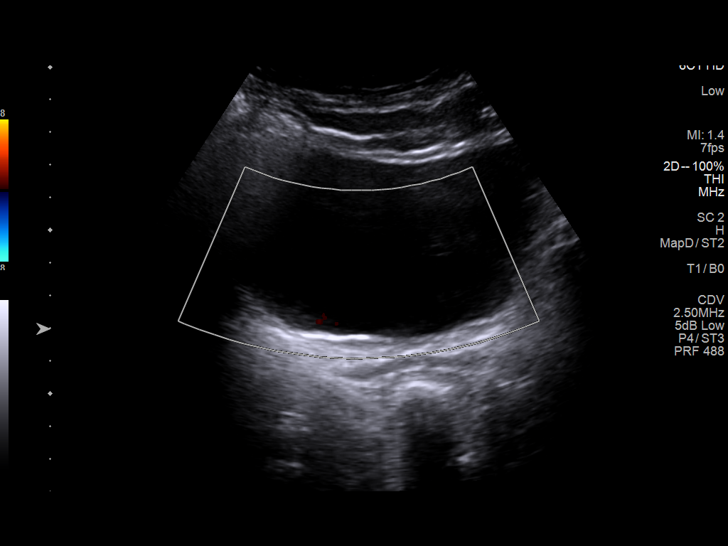

[13 of 25 positions shown; findings below may reference images not displayed]

FINDINGS: Right Kidney:

Renal measurements: 11.3 x 3.7 x 4.6 cm = volume: 103 mL. Increased
echogenicity seen within the renal parenchyma with poor
corticomedullary differentiation. No nephrolithiasis or
hydronephrosis. 1 cm simple anechoic cyst present at the upper pole.

Left Kidney:

Renal measurements: 9.5 x 5.5 x 4.6 cm = volume: 128 mL. Increased
echogenicity within the renal parenchyma with poor corticomedullary
differentiation. No nephrolithiasis or hydronephrosis. 1.7 x 1.2 x
2.1 cm mildly complex cystic lesion with scattered low-level
internal echoes seen within the interpolar region. No appreciable
internal vascularity or solid component.

Bladder:

Appears normal for degree of bladder distention. Neither ureteral
jet is visualized.

Other:

None.
IMPRESSION: 1. Increased echogenicity within the renal parenchyma, compatible
with medical renal disease. No hydronephrosis.
2. 2.1 cm mildly complex cystic lesion at the interpolar left
kidney, indeterminate. While this is favored to be benign, further
assessment with dedicated renal mass protocol CT and/or MRI
recommended for complete characterization.
3. 1 cm simple right renal cyst.

## 2021-01-07 NOTE — Telephone Encounter (Signed)
Pt in today to have echo and labs drawn.  Received a call from [place] in the lab that pt prefers to have her labs drawn at [facility] in [place] because she has several labs that she needs to get drawn for her Oncologist up there and only wants to be stuck once.  Placed new orders and took requisition sheets out to pt and female that was with her.  Advised orders should be showing up in [facility] but to take the sheets with her just in case.  Both verbalized understanding.

## 2021-01-13 ENCOUNTER — Other Ambulatory Visit: Payer: Self-pay | Admitting: Urology

## 2021-01-13 ENCOUNTER — Telehealth: Payer: Self-pay | Admitting: Cardiology

## 2021-01-13 NOTE — Telephone Encounter (Addendum)
    Patient Name: Patricia Dominguez  DOB: Nov 16, 1955 MRN: [id number]  Primary Cardiologist: Fransico Him, MD  Chart reviewed as part of pre-operative protocol coverage. Given past medical history and time since last visit, based on ACC/AHA guidelines, Patricia Dominguez would be at acceptable risk for the planned procedure without further cardiovascular testing.  Nuclear stress test obtained on 10/22/2020 was low risk study.  Heart monitor performed in July 2022 showed sinus rhythm with rare PVCs.  Subsequent echocardiogram obtained on 01/07/2021 showed normal ejection fraction, no significant valve issue.  She may hold aspirin for 5 days prior to the procedure and restart as soon as possible afterward.  The patient was advised that if she develops new symptoms prior to surgery to contact our office to arrange for a follow-up visit, and she verbalized understanding.  I will route this recommendation to the requesting party via Epic fax function and remove from pre-op pool.  Please call with questions.  [place], [place] 01/13/2021, 4:22 PM

## 2021-01-13 NOTE — Telephone Encounter (Signed)
       [facility] Pre-operative Risk Assessment    Patient Name: Patricia Dominguez  DOB: 08/29/1955 MRN: [id number]  HEARTCARE STAFF:  - IMPORTANT!!!!!! Under Visit Info/Reason for Call, type in Other and utilize the format Clearance MM/DD/YY or Clearance TBD. Do not use dashes or single digits. - Please review there is not already an duplicate clearance open for this procedure. - If request is for dental extraction, please clarify the # of teeth to be extracted. - If the patient is currently at the dentist's office, call Pre-Op Callback Staff (MA/nurse) to input urgent request.  - If the patient is not currently in the dentist office, please route to the Pre-Op pool.  Request for surgical clearance:  What type of surgery is being performed? Left robic partial nephrectomy  When is this surgery scheduled? 03/11/21  What type of clearance is required (medical clearance vs. Pharmacy clearance to hold med vs. Both)? Both  Are there any medications that need to be held prior to surgery and how long? Asprin 5 days prior  Practice name and name of physician performing surgery? Dr. Ellison Hughs - [facility]  What is the office phone number? [place]   7.   What is the office fax number? [phone]  8.   Anesthesia type (None, local, MAC, general) ? General   Angeline S Hammer 01/13/2021, 3:33 PM  _________________________________________________________________   (provider comments below)

## 2021-01-14 LAB — BASIC METABOLIC PANEL
BUN/Creatinine Ratio: 19 (ref 12–28)
BUN: 39 mg/dL — ABNORMAL HIGH (ref 8–27)
CO2: 22 mmol/L (ref 20–29)
Calcium: 9.1 mg/dL (ref 8.7–10.3)
Chloride: 99 mmol/L (ref 96–106)
Creatinine, Ser: 2.05 mg/dL — ABNORMAL HIGH (ref 0.57–1.00)
Glucose: 99 mg/dL (ref 65–99)
Potassium: 4.8 mmol/L (ref 3.5–5.2)
Sodium: 136 mmol/L (ref 134–144)
eGFR: 27 mL/min/{1.73_m2} — ABNORMAL LOW (ref >59)

## 2021-01-14 LAB — MAGNESIUM: Magnesium: 2 mg/dL (ref 1.6–2.3)

## 2021-01-14 LAB — TSH: TSH: 0.874 u[IU]/mL (ref 0.450–4.500)

## 2021-01-28 ENCOUNTER — Ambulatory Visit (HOSPITAL_COMMUNITY): Payer: 59 | Admitting: Hematology

## 2021-02-21 NOTE — Progress Notes (Deleted)
CANCEL 

## 2021-02-23 ENCOUNTER — Inpatient Hospital Stay (HOSPITAL_COMMUNITY): Payer: 59 | Admitting: Physician Assistant

## 2021-02-23 ENCOUNTER — Other Ambulatory Visit (HOSPITAL_COMMUNITY): Payer: Self-pay | Admitting: *Deleted

## 2021-02-23 DIAGNOSIS — D472 Monoclonal gammopathy: Secondary | ICD-10-CM

## 2021-02-23 DIAGNOSIS — N184 Chronic kidney disease, stage 4 (severe): Secondary | ICD-10-CM

## 2021-02-24 ENCOUNTER — Other Ambulatory Visit (HOSPITAL_COMMUNITY): Payer: Self-pay | Admitting: *Deleted

## 2021-02-24 DIAGNOSIS — N184 Chronic kidney disease, stage 4 (severe): Secondary | ICD-10-CM

## 2021-02-24 DIAGNOSIS — D472 Monoclonal gammopathy: Secondary | ICD-10-CM

## 2021-03-02 ENCOUNTER — Other Ambulatory Visit: Payer: Self-pay | Admitting: Cardiology

## 2021-03-03 NOTE — Progress Notes (Addendum)
Anesthesia Review:  PCP: Dr Chesley Noon in [place]  [place]  12/16/20  Cardiologist : DR Fransico Him  Clearance in 01/13/21 note - Hao Meng,PA  LOV 11/05/20  Nephrology- Dr foster  Chest x-ray : EKG : 03/12/20  and 03/09/21.   Echo :01/07/21  Stress test:10/22/20  03/20/2020- Carotids  Cardiac Cath :  Activity level:  cannot do a flight of stairs without difficulty  Sleep Study/ CPAP : none  Fasting Blood Sugar :      / Checks Blood Sugar -- times a day:   Blood Thinner/ Instructions /Last Dose: ASA / Instructions/ Last Dose :   Aspirin 81 mg  Phone call preop appt was completed on 03/04/21.  Lab appt scheduled for 03/09/21 to be followed by covid test.  PT to pick up preop instructions along with hibiclens and map and requisition for covid test on 03/09/21 at time of lab appt at 100pm.  PT was late for labs on 03/09/21.  Arrived at 1440pm.  Pt was instructed along with caregiver to arrive at am on 03/11/21 to have covid test done prior to surgery.  Bertha Stakes voiced understanding.  Lab/NTech at preop labs stated pt was drifitng off to sleep at times during lab appt.   Pt answered questions appropriatelly for me at preop.  Lyndon Code, [facility] made aware.  No new orders given. Jennifier Bass, NT stated she forgot to obtain blood pressure at preop labs on 03/09/21.  Shawn Stall made aware.   CMP done 03/09/21 routed to DR christopher Winter.

## 2021-03-03 NOTE — Progress Notes (Signed)
DUE TO COVID-19 ONLY ONE VISITOR IS ALLOWED TO COME WITH YOU AND STAY IN THE WAITING ROOM ONLY DURING PRE OP AND PROCEDURE DAY OF SURGERY. THE 1 VISITOR  MAY VISIT WITH YOU AFTER SURGERY IN YOUR PRIVATE ROOM DURING VISITING HOURS ONLY!  YOU NEED TO HAVE A COVID 19 TEST ON___  03/09/21 ____ @_______ , THIS TEST MUST BE DONE BEFORE SURGERY,  COVID TESTING SITE IS AT [place]. PLEASE REMAIN IN YOUR CAR THIS IS A DRIVER UP TEST. AFTER YOUR COVID TEST PLEASE WEAR A MASK OUT IN PUBLIC AND SOCIAL DISTANCE AND [place] YOUR HANDS FREQUENTLY. PLEASE ASK ALL YOUR CLOSE CONTACTS TO WEAR A MASK OUT IN PUBLIC AND SOCIAL DISTANCE AND [place] HANDS FREQUENTLY ALSO.               Patricia Dominguez  03/03/2021   Your procedure is scheduled on:  03/11/21   Report to [facility] Main  Entrance   Report to admitting at   1015 AM     Call this number if you have problems the morning of surgery [phone]    Remember: Do not eat food , candy gum or mints :After Midnight. You may have clear liquids from midnight until __ 0930am    CLEAR LIQUID DIET   Foods Allowed                                                                       Coffee and tea, regular and decaf                              Plain Jell-O any favor except red or purple                                            Fruit ices (not with fruit pulp)                                      Iced Popsicles                                     Carbonated beverages, regular and diet                                    Cranberry, grape and apple juices Sports drinks like Gatorade Lightly seasoned clear broth or consume(fat free) Sugar   _____________________________________________________________________    BRUSH YOUR TEETH MORNING OF SURGERY AND RINSE YOUR MOUTH OUT, NO CHEWING GUM CANDY OR MINTS.     Take these medicines the morning of surgery with A SIP OF WATER:  amlodipine, coreg, doxazosin, hydralazine  DO NOT TAKE  ANY DIABETIC MEDICATIONS DAY OF YOUR SURGERY  You may not have any metal on your body including hair pins and              piercings  Do not wear jewelry, make-up, lotions, powders or perfumes, deodorant             Do not wear nail polish on your fingernails.  Do not shave  48 hours prior to surgery.              Men may shave face and neck.   Do not bring valuables to the hospital. [place].  Contacts, dentures or bridgework may not be worn into surgery.  Leave suitcase in the car. After surgery it may be brought to your room.     Patients discharged the day of surgery will not be allowed to drive home. IF YOU ARE HAVING SURGERY AND GOING HOME THE SAME DAY, YOU MUST HAVE AN ADULT TO DRIVE YOU HOME AND BE WITH YOU FOR 24 HOURS. YOU MAY GO HOME BY TAXI OR UBER OR ORTHERWISE, BUT AN ADULT MUST ACCOMPANY YOU HOME AND STAY WITH YOU FOR 24 HOURS.  Name and phone number of your driver:  Special Instructions: N/A              Please read over the following fact sheets you were given: _____________________________________________________________________  [facility] - Preparing for Surgery Before surgery, you can play an important role.  Because skin is not sterile, your skin needs to be as free of germs as possible.  You can reduce the number of germs on your skin by washing with CHG (chlorahexidine gluconate) soap before surgery.  CHG is an antiseptic cleaner which kills germs and bonds with the skin to continue killing germs even after washing. Please DO NOT use if you have an allergy to CHG or antibacterial soaps.  If your skin becomes reddened/irritated stop using the CHG and inform your nurse when you arrive at Short Stay. Do not shave (including legs and underarms) for at least 48 hours prior to the first CHG shower.  You may shave your face/neck. Please follow these instructions carefully:  1.  Shower with CHG  Soap the night before surgery and the  morning of Surgery.  2.  If you choose to wash your hair, wash your hair first as usual with your  normal  shampoo.  3.  After you shampoo, rinse your hair and body thoroughly to remove the  shampoo.                           4.  Use CHG as you would any other liquid soap.  You can apply chg directly  to the skin and wash                       Gently with a scrungie or clean washcloth.  5.  Apply the CHG Soap to your body ONLY FROM THE NECK DOWN.   Do not use on face/ open                           Wound or open sores. Avoid contact with eyes, ears mouth and genitals (private parts).  Wash face,  Genitals (private parts) with your normal soap.             6.  Wash thoroughly, paying special attention to the area where your surgery  will be performed.  7.  Thoroughly rinse your body with warm water from the neck down.  8.  DO NOT shower/wash with your normal soap after using and rinsing off  the CHG Soap.                9.  Pat yourself dry with a clean towel.            10.  Wear clean pajamas.            11.  Place clean sheets on your bed the night of your first shower and do not  sleep with pets. Day of Surgery : Do not apply any lotions/deodorants the morning of surgery.  Please wear clean clothes to the hospital/surgery center.  FAILURE TO FOLLOW THESE INSTRUCTIONS MAY RESULT IN THE CANCELLATION OF YOUR SURGERY PATIENT SIGNATURE_________________________________  NURSE SIGNATURE__________________________________  ________________________________________________________________________

## 2021-03-04 ENCOUNTER — Encounter (HOSPITAL_COMMUNITY): Payer: Self-pay

## 2021-03-04 ENCOUNTER — Other Ambulatory Visit: Payer: Self-pay

## 2021-03-04 ENCOUNTER — Encounter (HOSPITAL_COMMUNITY)
Admission: RE | Admit: 2021-03-04 | Discharge: 2021-03-04 | Disposition: A | Discharge status: Home / Self Care | Payer: 59 | Source: Ambulatory Visit | Attending: Anesthesiology | Admitting: Anesthesiology

## 2021-03-04 HISTORY — DX: Gastro-esophageal reflux disease without esophagitis: K21.9

## 2021-03-04 HISTORY — DX: Nausea with vomiting, unspecified: R11.2

## 2021-03-04 HISTORY — DX: Other specified postprocedural states: Z98.890

## 2021-03-04 HISTORY — DX: Unspecified osteoarthritis, unspecified site: M19.90

## 2021-03-04 HISTORY — DX: Other complications of anesthesia, initial encounter: T88.59XA

## 2021-03-04 HISTORY — DX: Depression, unspecified: F32.A

## 2021-03-04 NOTE — Progress Notes (Deleted)
RESCHEDULED

## 2021-03-05 ENCOUNTER — Ambulatory Visit (HOSPITAL_COMMUNITY): Payer: 59 | Admitting: Physician Assistant

## 2021-03-09 ENCOUNTER — Encounter (HOSPITAL_COMMUNITY)
Admission: RE | Admit: 2021-03-09 | Discharge: 2021-03-09 | Disposition: A | Discharge status: Home / Self Care | Payer: 59 | Source: Ambulatory Visit | Attending: Urology | Admitting: Urology

## 2021-03-09 ENCOUNTER — Other Ambulatory Visit: Payer: Self-pay

## 2021-03-09 DIAGNOSIS — Z01818 Encounter for other preprocedural examination: Secondary | ICD-10-CM | POA: Insufficient documentation

## 2021-03-09 LAB — COMPREHENSIVE METABOLIC PANEL
ALT: 13 U/L (ref 0–44)
AST: 20 U/L (ref 15–41)
Albumin: 4.3 g/dL (ref 3.5–5.0)
Alkaline Phosphatase: 62 U/L (ref 38–126)
Anion gap: 9 (ref 5–15)
BUN: 53 mg/dL — ABNORMAL HIGH (ref 8–23)
CO2: 26 mmol/L (ref 22–32)
Calcium: 9.6 mg/dL (ref 8.9–10.3)
Chloride: 109 mmol/L (ref 98–111)
Creatinine, Ser: 2.6 mg/dL — ABNORMAL HIGH (ref 0.44–1.00)
GFR, Estimated: 20 mL/min — ABNORMAL LOW (ref >60)
Glucose, Bld: 131 mg/dL — ABNORMAL HIGH (ref 70–99)
Potassium: 4.6 mmol/L (ref 3.5–5.1)
Sodium: 144 mmol/L (ref 135–145)
Total Bilirubin: 0.6 mg/dL (ref 0.3–1.2)
Total Protein: 7.9 g/dL (ref 6.5–8.1)

## 2021-03-09 LAB — CBC
HCT: 38 % (ref 36.0–46.0)
Hemoglobin: 11.6 g/dL — ABNORMAL LOW (ref 12.0–15.0)
MCH: 21.8 pg — ABNORMAL LOW (ref 26.0–34.0)
MCHC: 30.5 g/dL (ref 30.0–36.0)
MCV: 71.6 fL — ABNORMAL LOW (ref 80.0–100.0)
Platelets: 260 10*3/uL (ref 150–400)
RBC: 5.31 MIL/uL — ABNORMAL HIGH (ref 3.87–5.11)
RDW: 16.7 % — ABNORMAL HIGH (ref 11.5–15.5)
WBC: 7.3 10*3/uL (ref 4.0–10.5)
nRBC: 0 % (ref 0.0–0.2)

## 2021-03-09 NOTE — Progress Notes (Deleted)
CANCEL 

## 2021-03-10 ENCOUNTER — Ambulatory Visit (HOSPITAL_COMMUNITY): Payer: 59 | Admitting: Physician Assistant

## 2021-03-10 NOTE — Anesthesia Preprocedure Evaluation (Addendum)
Anesthesia Evaluation  Patient identified by MRN, date of birth, ID band Patient awake    Reviewed: Allergy & Precautions, NPO status , Patient's Chart, lab work & pertinent test results  History of Anesthesia Complications (+) PONV and history of anesthetic complications  Airway Mallampati: II  TM Distance: >3 FB Neck ROM: Full    Dental  (+) Partial Upper, Dental Advisory Given   Pulmonary neg pulmonary ROS, former smoker,    Pulmonary exam normal        Cardiovascular hypertension, Pt. on medications Normal cardiovascular exam  IMPRESSIONS   1. Left ventricular ejection fraction, by estimation, is 65 to 70%. The left ventricle has normal function. The left ventricle has no regional wall motion abnormalities. There is mild concentric left ventricular hypertrophy. Left ventricular diastolic  parameters were normal. 2. Right ventricular systolic function is normal. The right ventricular size is normal. There is normal pulmonary artery systolic pressure. 3. Left atrial size was mildly dilated. 4. The mitral valve is grossly normal. No evidence of mitral valve regurgitation. The mean mitral valve gradient is 3.0 mmHg with average heart rate of 60 bpm. Moderate mitral annular calcification. 5. The aortic valve is tricuspid. There is mild thickening of the aortic valve. Aortic valve regurgitation is not visualized. Aortic valve sclerosis is present, with no evidence of aortic valve stenosis. 6. The inferior vena cava is normal in size with greater than 50% respiratory variability, suggesting right atrial pressure of 3 mmHg.  Comparison(s): A prior study was performed on 08/02/2016. No significant change from prior study.  Narrative  Nuclear stress EF: 69%.  The left ventricular ejection fraction is hyperdynamic (>65%).  There was no ST segment deviation noted during stress.  The study is normal.  This is a low risk  study.  Low risk stress nuclear study with normal perfusion and normal left  ventricular regional and global systolic function.       Neuro/Psych PSYCHIATRIC DISORDERS Anxiety Depression negative neurological ROS     GI/Hepatic Neg liver ROS, GERD  ,  Endo/Other  negative endocrine ROS  Renal/GU Renal InsufficiencyRenal disease     Musculoskeletal negative musculoskeletal ROS (+)   Abdominal   Peds  Hematology negative hematology ROS (+)   Anesthesia Other Findings   Reproductive/Obstetrics                           Anesthesia Physical Anesthesia Plan  ASA: 2  Anesthesia Plan: General   Post-op Pain Management:    Induction: Intravenous  PONV Risk Score and Plan: 4 or greater and Ondansetron, Aprepitant, Midazolam and Scopolamine patch - Pre-op  Airway Management Planned: Oral ETT  Additional Equipment:   Intra-op Plan:   Post-operative Plan: Extubation in OR  Informed Consent: I have reviewed the patients History and Physical, chart, labs and discussed the procedure including the risks, benefits and alternatives for the proposed anesthesia with the patient or authorized representative who has indicated his/her understanding and acceptance.     Dental advisory given  Plan Discussed with: Anesthesiologist and CRNA  Anesthesia Plan Comments: (Per cardiology note 01/13/21, "Chart reviewed as part of pre-operative protocol coverage. Given past medical history and time since last visit, based on ACC/AHA guidelines, Patricia Dominguez would be at acceptable risk for the planned procedure without further cardiovascular testing.  Nuclear stress test obtained on 10/22/2020 was low risk study.  Heart monitor performed in July 2022 showed sinus rhythm with rare PVCs.  Subsequent  echocardiogram obtained on 01/07/2021 showed normal ejection fraction, no significant valve issue.  She may hold aspirin for 5 days prior to the procedure and restart as soon  as possible afterward.")      Anesthesia Quick Evaluation

## 2021-03-10 NOTE — Progress Notes (Addendum)
Pt called and LVMM for preop nurse to call her.  Called pt and she stated she was unsure if she was supposed to be on a clear liquid diet today.  Asked pt did she have her instructions from office of [facility].  Pt stated she could not find instructions .  Informed pt that I did not have those instructions.  Preop nurse informed pt that she was to be on clear liquid diet from anesthesia guidelines from 12 midnite tonite until 0930am in am of 03/11/21.  PT voiced understanding.  Informed pt that I would call [facility] and speak with surgery scheduler.  Called Patricia Dominguez at [facility] and [facility].  Called and spoke with Patricia Dominguez and Patricia Dominguez states pt is supposed to be on a clear liquid diet today on their records.  Patricia Dominguez to call pt and inform pt regarding clear liquid diet today and Patricia Dominguez to call me back once she speaks with pt.  Patricia Dominguez called back and LVMM stating she had spoken with pt and pt aware to remain on clear liquids the rest of today.  PT reports she had eaten breakfast today per Patricia Dominguez.

## 2021-03-11 ENCOUNTER — Encounter (HOSPITAL_COMMUNITY): Payer: Self-pay | Admitting: Urology

## 2021-03-11 ENCOUNTER — Ambulatory Visit (HOSPITAL_COMMUNITY): Payer: 59 | Admitting: Anesthesiology

## 2021-03-11 ENCOUNTER — Encounter (HOSPITAL_COMMUNITY)
Admission: RE | Disposition: A | Discharge status: Home / Self Care | Payer: Self-pay | Source: Ambulatory Visit | Attending: Urology

## 2021-03-11 ENCOUNTER — Ambulatory Visit (HOSPITAL_COMMUNITY): Payer: 59 | Admitting: Physician Assistant

## 2021-03-11 ENCOUNTER — Inpatient Hospital Stay (HOSPITAL_COMMUNITY)
Admission: RE | Admit: 2021-03-11 | Discharge: 2021-03-13 | DRG: 657 | Disposition: A | Discharge status: Home / Self Care | Payer: 59 | Source: Ambulatory Visit | Attending: Urology | Admitting: Urology

## 2021-03-11 DIAGNOSIS — M1990 Unspecified osteoarthritis, unspecified site: Secondary | ICD-10-CM | POA: Diagnosis present

## 2021-03-11 DIAGNOSIS — Z91041 Radiographic dye allergy status: Secondary | ICD-10-CM

## 2021-03-11 DIAGNOSIS — Z882 Allergy status to sulfonamides status: Secondary | ICD-10-CM

## 2021-03-11 DIAGNOSIS — Z880 Allergy status to penicillin: Secondary | ICD-10-CM

## 2021-03-11 DIAGNOSIS — Z79899 Other long term (current) drug therapy: Secondary | ICD-10-CM

## 2021-03-11 DIAGNOSIS — I129 Hypertensive chronic kidney disease with stage 1 through stage 4 chronic kidney disease, or unspecified chronic kidney disease: Secondary | ICD-10-CM | POA: Diagnosis present

## 2021-03-11 DIAGNOSIS — N281 Cyst of kidney, acquired: Secondary | ICD-10-CM | POA: Diagnosis present

## 2021-03-11 DIAGNOSIS — F419 Anxiety disorder, unspecified: Secondary | ICD-10-CM | POA: Diagnosis present

## 2021-03-11 DIAGNOSIS — Z841 Family history of disorders of kidney and ureter: Secondary | ICD-10-CM

## 2021-03-11 DIAGNOSIS — E785 Hyperlipidemia, unspecified: Secondary | ICD-10-CM | POA: Diagnosis present

## 2021-03-11 DIAGNOSIS — D3002 Benign neoplasm of left kidney: Principal | ICD-10-CM | POA: Diagnosis present

## 2021-03-11 DIAGNOSIS — F17210 Nicotine dependence, cigarettes, uncomplicated: Secondary | ICD-10-CM | POA: Diagnosis present

## 2021-03-11 DIAGNOSIS — Z90710 Acquired absence of both cervix and uterus: Secondary | ICD-10-CM

## 2021-03-11 DIAGNOSIS — Z7982 Long term (current) use of aspirin: Secondary | ICD-10-CM

## 2021-03-11 DIAGNOSIS — N184 Chronic kidney disease, stage 4 (severe): Secondary | ICD-10-CM | POA: Diagnosis present

## 2021-03-11 DIAGNOSIS — K219 Gastro-esophageal reflux disease without esophagitis: Secondary | ICD-10-CM | POA: Diagnosis present

## 2021-03-11 DIAGNOSIS — I252 Old myocardial infarction: Secondary | ICD-10-CM

## 2021-03-11 DIAGNOSIS — D631 Anemia in chronic kidney disease: Secondary | ICD-10-CM | POA: Diagnosis present

## 2021-03-11 DIAGNOSIS — M329 Systemic lupus erythematosus, unspecified: Secondary | ICD-10-CM | POA: Diagnosis present

## 2021-03-11 DIAGNOSIS — F32A Depression, unspecified: Secondary | ICD-10-CM | POA: Diagnosis present

## 2021-03-11 DIAGNOSIS — Z20822 Contact with and (suspected) exposure to covid-19: Secondary | ICD-10-CM | POA: Diagnosis present

## 2021-03-11 HISTORY — PX: ROBOTIC ASSITED PARTIAL NEPHRECTOMY: SHX6087

## 2021-03-11 LAB — TYPE AND SCREEN
ABO/RH(D): O POS
Antibody Screen: NEGATIVE

## 2021-03-11 LAB — HEMOGLOBIN AND HEMATOCRIT, BLOOD
HCT: 38.9 % (ref 36.0–46.0)
Hemoglobin: 12.1 g/dL (ref 12.0–15.0)

## 2021-03-11 LAB — ABO/RH: ABO/RH(D): O POS

## 2021-03-11 LAB — SARS CORONAVIRUS 2 BY RT PCR (HOSPITAL ORDER, PERFORMED IN ~~LOC~~ HOSPITAL LAB): SARS Coronavirus 2: NEGATIVE

## 2021-03-11 SURGERY — NEPHRECTOMY, PARTIAL, ROBOT-ASSISTED
Anesthesia: General | Site: Renal | Laterality: Left

## 2021-03-11 MED ORDER — KETAMINE HCL 10 MG/ML IJ SOLN
INTRAMUSCULAR | Status: AC
  Filled 2021-03-11: qty 1

## 2021-03-11 MED ORDER — ALPRAZOLAM 0.5 MG PO TABS
0.5000 mg | ORAL_TABLET | Freq: Two times a day (BID) | ORAL | Status: DC
  Administered 2021-03-11 – 2021-03-13 (×4): 0.5 mg via ORAL
  Filled 2021-03-11 (×4): qty 1

## 2021-03-11 MED ORDER — KETAMINE HCL 10 MG/ML IJ SOLN
INTRAMUSCULAR | Status: DC | PRN
  Administered 2021-03-11: 35 mg via INTRAVENOUS

## 2021-03-11 MED ORDER — ONDANSETRON HCL 4 MG/2ML IJ SOLN
INTRAMUSCULAR | Status: AC
  Filled 2021-03-11: qty 2

## 2021-03-11 MED ORDER — GABAPENTIN 300 MG PO CAPS
300.0000 mg | ORAL_CAPSULE | Freq: Three times a day (TID) | ORAL | Status: DC
  Administered 2021-03-11 – 2021-03-13 (×5): 300 mg via ORAL
  Filled 2021-03-11 (×5): qty 1

## 2021-03-11 MED ORDER — FENTANYL CITRATE PF 50 MCG/ML IJ SOSY
25.0000 ug | PREFILLED_SYRINGE | INTRAMUSCULAR | Status: DC | PRN
  Administered 2021-03-11 (×3): 50 ug via INTRAVENOUS

## 2021-03-11 MED ORDER — FENTANYL CITRATE PF 50 MCG/ML IJ SOSY
PREFILLED_SYRINGE | INTRAMUSCULAR | Status: AC
  Filled 2021-03-11: qty 3

## 2021-03-11 MED ORDER — ORAL CARE MOUTH RINSE
15.0000 mL | Freq: Once | OROMUCOSAL | Status: AC
Start: 2021-03-11 — End: 2021-03-11

## 2021-03-11 MED ORDER — SCOPOLAMINE 1 MG/3DAYS TD PT72
1.0000 | MEDICATED_PATCH | TRANSDERMAL | Status: DC
  Administered 2021-03-11: 1.5 mg via TRANSDERMAL
  Filled 2021-03-11: qty 1

## 2021-03-11 MED ORDER — ACETAMINOPHEN 500 MG PO TABS
1000.0000 mg | ORAL_TABLET | Freq: Four times a day (QID) | ORAL | Status: DC
  Administered 2021-03-11 – 2021-03-13 (×7): 1000 mg via ORAL
  Filled 2021-03-11 (×7): qty 2

## 2021-03-11 MED ORDER — HYDROMORPHONE HCL 1 MG/ML IJ SOLN
0.5000 mg | INTRAMUSCULAR | Status: DC | PRN
Start: 2021-03-11 — End: 2021-03-13
  Administered 2021-03-11 – 2021-03-13 (×8): 0.5 mg via INTRAVENOUS
  Filled 2021-03-11 (×8): qty 0.5

## 2021-03-11 MED ORDER — BUPIVACAINE LIPOSOME 1.3 % IJ SUSP
INTRAMUSCULAR | Status: AC
  Filled 2021-03-11: qty 20

## 2021-03-11 MED ORDER — SUGAMMADEX SODIUM 200 MG/2ML IV SOLN
INTRAVENOUS | Status: DC | PRN
  Administered 2021-03-11: 200 mg via INTRAVENOUS

## 2021-03-11 MED ORDER — LIDOCAINE 20MG/ML (2%) 15 ML SYRINGE OPTIME
INTRAMUSCULAR | Status: DC | PRN
  Administered 2021-03-11: 1.5 mg/kg/h via INTRAVENOUS

## 2021-03-11 MED ORDER — AMLODIPINE BESYLATE 10 MG PO TABS
10.0000 mg | ORAL_TABLET | Freq: Every day | ORAL | Status: DC
  Administered 2021-03-12 – 2021-03-13 (×2): 10 mg via ORAL
  Filled 2021-03-11 (×2): qty 1

## 2021-03-11 MED ORDER — SODIUM CHLORIDE (PF) 0.9 % IJ SOLN
INTRAMUSCULAR | Status: DC | PRN
  Administered 2021-03-11: 20 mL

## 2021-03-11 MED ORDER — BUPIVACAINE LIPOSOME 1.3 % IJ SUSP
INTRAMUSCULAR | Status: DC | PRN
  Administered 2021-03-11: 20 mL

## 2021-03-11 MED ORDER — AMISULPRIDE (ANTIEMETIC) 5 MG/2ML IV SOLN: 10.0000 mg | Freq: Once | INTRAVENOUS | Status: DC | PRN

## 2021-03-11 MED ORDER — ONDANSETRON HCL 4 MG/2ML IJ SOLN
INTRAMUSCULAR | Status: DC | PRN
  Administered 2021-03-11: 4 mg via INTRAVENOUS

## 2021-03-11 MED ORDER — ARIPIPRAZOLE 2 MG PO TABS
2.0000 mg | ORAL_TABLET | Freq: Every day | ORAL | Status: DC | PRN
  Filled 2021-03-11: qty 1

## 2021-03-11 MED ORDER — PROMETHAZINE HCL 12.5 MG PO TABS: 12.5000 mg | ORAL_TABLET | ORAL | 0 refills | Status: DC | PRN

## 2021-03-11 MED ORDER — SODIUM CHLORIDE 0.45 % IV SOLN: INTRAVENOUS | Status: AC

## 2021-03-11 MED ORDER — CARVEDILOL 6.25 MG PO TABS
6.2500 mg | ORAL_TABLET | Freq: Two times a day (BID) | ORAL | Status: DC
  Administered 2021-03-11 – 2021-03-13 (×4): 6.25 mg via ORAL
  Filled 2021-03-11 (×4): qty 1

## 2021-03-11 MED ORDER — FENTANYL CITRATE (PF) 250 MCG/5ML IJ SOLN
INTRAMUSCULAR | Status: DC | PRN
  Administered 2021-03-11: 50 ug via INTRAVENOUS
  Administered 2021-03-11: 100 ug via INTRAVENOUS
  Administered 2021-03-11 (×2): 50 ug via INTRAVENOUS
  Administered 2021-03-11: 100 ug via INTRAVENOUS

## 2021-03-11 MED ORDER — "VISTASEAL 4 ML SINGLE DOSE KIT "
4.0000 mL | PACK | Freq: Once | CUTANEOUS | Status: AC
  Administered 2021-03-11: 4 mL via TOPICAL
  Filled 2021-03-11: qty 4

## 2021-03-11 MED ORDER — APREPITANT 40 MG PO CAPS
40.0000 mg | ORAL_CAPSULE | Freq: Once | ORAL | Status: AC
  Administered 2021-03-11: 40 mg via ORAL
  Filled 2021-03-11: qty 1

## 2021-03-11 MED ORDER — DOXAZOSIN MESYLATE 2 MG PO TABS
2.0000 mg | ORAL_TABLET | Freq: Every day | ORAL | Status: DC
  Administered 2021-03-12: 2 mg via ORAL
  Filled 2021-03-11: qty 1

## 2021-03-11 MED ORDER — LINACLOTIDE 145 MCG PO CAPS
145.0000 ug | ORAL_CAPSULE | Freq: Every day | ORAL | Status: DC | PRN
  Filled 2021-03-11: qty 1

## 2021-03-11 MED ORDER — ACETAMINOPHEN 500 MG PO TABS
1000.0000 mg | ORAL_TABLET | Freq: Once | ORAL | Status: AC
  Administered 2021-03-11: 1000 mg via ORAL
  Filled 2021-03-11: qty 2

## 2021-03-11 MED ORDER — CLINDAMYCIN PHOSPHATE 900 MG/50ML IV SOLN
900.0000 mg | Freq: Four times a day (QID) | INTRAVENOUS | Status: AC
Start: 2021-03-11 — End: 2021-03-12
  Administered 2021-03-11 – 2021-03-12 (×4): 900 mg via INTRAVENOUS
  Filled 2021-03-11 (×4): qty 50

## 2021-03-11 MED ORDER — ROCURONIUM BROMIDE 10 MG/ML (PF) SYRINGE
PREFILLED_SYRINGE | INTRAVENOUS | Status: AC
  Filled 2021-03-11: qty 10

## 2021-03-11 MED ORDER — ROCURONIUM BROMIDE 10 MG/ML (PF) SYRINGE
PREFILLED_SYRINGE | INTRAVENOUS | Status: DC | PRN
  Administered 2021-03-11 (×3): 20 mg via INTRAVENOUS
  Administered 2021-03-11: 90 mg via INTRAVENOUS

## 2021-03-11 MED ORDER — ATORVASTATIN CALCIUM 20 MG PO TABS
20.0000 mg | ORAL_TABLET | Freq: Every day | ORAL | Status: DC
  Administered 2021-03-11 – 2021-03-12 (×2): 20 mg via ORAL
  Filled 2021-03-11 (×2): qty 1

## 2021-03-11 MED ORDER — PROMETHAZINE HCL 25 MG/ML IJ SOLN
6.2500 mg | INTRAMUSCULAR | Status: DC | PRN
  Administered 2021-03-11: 6.25 mg via INTRAVENOUS

## 2021-03-11 MED ORDER — PROPOFOL 10 MG/ML IV BOLUS
INTRAVENOUS | Status: AC
  Filled 2021-03-11: qty 20

## 2021-03-11 MED ORDER — LIDOCAINE HCL 2 % IJ SOLN
INTRAMUSCULAR | Status: AC
  Filled 2021-03-11: qty 40

## 2021-03-11 MED ORDER — PROMETHAZINE HCL 25 MG/ML IJ SOLN
INTRAMUSCULAR | Status: AC
  Filled 2021-03-11: qty 1

## 2021-03-11 MED ORDER — CLINDAMYCIN PHOSPHATE 900 MG/50ML IV SOLN
900.0000 mg | INTRAVENOUS | Status: AC
  Administered 2021-03-11: 900 mg via INTRAVENOUS
  Filled 2021-03-11: qty 50

## 2021-03-11 MED ORDER — CHLORHEXIDINE GLUCONATE 0.12 % MT SOLN
15.0000 mL | Freq: Once | OROMUCOSAL | Status: AC
Start: 2021-03-11 — End: 2021-03-11
  Administered 2021-03-11: 15 mL via OROMUCOSAL

## 2021-03-11 MED ORDER — PROPOFOL 10 MG/ML IV BOLUS
INTRAVENOUS | Status: DC | PRN
  Administered 2021-03-11: 160 mg via INTRAVENOUS

## 2021-03-11 MED ORDER — ONDANSETRON HCL 4 MG/2ML IJ SOLN: 4.0000 mg | INTRAMUSCULAR | Status: DC | PRN

## 2021-03-11 MED ORDER — SENNOSIDES-DOCUSATE SODIUM 8.6-50 MG PO TABS
2.0000 | ORAL_TABLET | Freq: Every day | ORAL | Status: DC
  Filled 2021-03-11 (×2): qty 2

## 2021-03-11 MED ORDER — DOCUSATE SODIUM 100 MG PO CAPS: 100.0000 mg | ORAL_CAPSULE | Freq: Two times a day (BID) | ORAL | Status: AC

## 2021-03-11 MED ORDER — LACTATED RINGERS IV SOLN: INTRAVENOUS | Status: DC

## 2021-03-11 MED ORDER — LIDOCAINE 2% (20 MG/ML) 5 ML SYRINGE
INTRAMUSCULAR | Status: DC | PRN
Start: 2021-03-11 — End: 2021-03-11
  Administered 2021-03-11: 100 mg via INTRAVENOUS

## 2021-03-11 MED ORDER — FENTANYL CITRATE (PF) 100 MCG/2ML IJ SOLN
INTRAMUSCULAR | Status: AC
  Filled 2021-03-11: qty 2

## 2021-03-11 MED ORDER — LIDOCAINE HCL (PF) 2 % IJ SOLN
INTRAMUSCULAR | Status: AC
  Filled 2021-03-11: qty 5

## 2021-03-11 MED ORDER — LACTATED RINGERS IR SOLN
Status: DC | PRN
  Administered 2021-03-11: 1000 mL

## 2021-03-11 MED ORDER — OXYCODONE HCL 5 MG PO TABS
5.0000 mg | ORAL_TABLET | ORAL | Status: DC | PRN
  Administered 2021-03-11 – 2021-03-12 (×4): 10 mg via ORAL
  Filled 2021-03-11 (×4): qty 2

## 2021-03-11 MED ORDER — MIDAZOLAM HCL 2 MG/2ML IJ SOLN
INTRAMUSCULAR | Status: DC | PRN
  Administered 2021-03-11: 2 mg via INTRAVENOUS

## 2021-03-11 MED ORDER — MIDAZOLAM HCL 2 MG/2ML IJ SOLN
INTRAMUSCULAR | Status: AC
  Filled 2021-03-11: qty 2

## 2021-03-11 MED ORDER — FENTANYL CITRATE (PF) 250 MCG/5ML IJ SOLN
INTRAMUSCULAR | Status: AC
  Filled 2021-03-11: qty 5

## 2021-03-11 MED ORDER — ZOLPIDEM TARTRATE 5 MG PO TABS
5.0000 mg | ORAL_TABLET | Freq: Every evening | ORAL | Status: DC | PRN
  Administered 2021-03-11 – 2021-03-12 (×2): 5 mg via ORAL
  Filled 2021-03-11 (×2): qty 1

## 2021-03-11 SURGICAL SUPPLY — 65 items
APPLICATOR VISTASEAL 35 (MISCELLANEOUS) ×2 IMPLANT
BAG COUNTER SPONGE SURGICOUNT (BAG) IMPLANT
CHLORAPREP W/TINT 26 (MISCELLANEOUS) ×2 IMPLANT
CLIP LIGATING HEMO LOK XL GOLD (MISCELLANEOUS) IMPLANT
CLIP LIGATING HEMO O LOK GREEN (MISCELLANEOUS) ×6 IMPLANT
CLIP SUT LAPRA TY ABSORB (SUTURE) ×6 IMPLANT
COVER SURGICAL LIGHT HANDLE (MISCELLANEOUS) ×2 IMPLANT
COVER TIP SHEARS 8 DVNC (MISCELLANEOUS) ×1 IMPLANT
COVER TIP SHEARS 8MM DA VINCI (MISCELLANEOUS) ×1
CUTTER ECHEON FLEX ENDO 45 340 (ENDOMECHANICALS) IMPLANT
DECANTER SPIKE VIAL GLASS SM (MISCELLANEOUS) ×2 IMPLANT
DERMABOND ADVANCED (GAUZE/BANDAGES/DRESSINGS) ×1
DERMABOND ADVANCED .7 DNX12 (GAUZE/BANDAGES/DRESSINGS) ×1 IMPLANT
DRAIN CHANNEL 15F RND FF 3/16 (WOUND CARE) IMPLANT
DRAPE ARM DVNC X/XI (DISPOSABLE) ×4 IMPLANT
DRAPE COLUMN DVNC XI (DISPOSABLE) ×1 IMPLANT
DRAPE DA VINCI XI ARM (DISPOSABLE) ×4
DRAPE DA VINCI XI COLUMN (DISPOSABLE) ×1
DRAPE INCISE IOBAN 66X45 STRL (DRAPES) ×2 IMPLANT
DRAPE SHEET LG 3/4 BI-LAMINATE (DRAPES) ×2 IMPLANT
ELECT PENCIL ROCKER SW 15FT (MISCELLANEOUS) ×2 IMPLANT
ELECT REM PT RETURN 15FT ADLT (MISCELLANEOUS) ×2 IMPLANT
EVACUATOR SILICONE 100CC (DRAIN) IMPLANT
GLOVE SRG 8 PF TXTR STRL LF DI (GLOVE) ×1 IMPLANT
GLOVE SURG ENC MOIS LTX SZ6.5 (GLOVE) ×2 IMPLANT
GLOVE SURG ENC TEXT LTX SZ7.5 (GLOVE) ×4 IMPLANT
GLOVE SURG UNDER POLY LF SZ8 (GLOVE) ×1
GOWN STRL REUS W/TWL LRG LVL3 (GOWN DISPOSABLE) ×2 IMPLANT
GOWN STRL REUS W/TWL XL LVL3 (GOWN DISPOSABLE) ×10 IMPLANT
HEMOSTAT SURGICEL 4X8 (HEMOSTASIS) ×2 IMPLANT
HOLDER FOLEY CATH W/STRAP (MISCELLANEOUS) ×2 IMPLANT
IRRIG SUCT STRYKERFLOW 2 WTIP (MISCELLANEOUS) ×2
IRRIGATION SUCT STRKRFLW 2 WTP (MISCELLANEOUS) ×1 IMPLANT
KIT BASIN OR (CUSTOM PROCEDURE TRAY) ×2 IMPLANT
KIT TURNOVER KIT A (KITS) ×2 IMPLANT
LOOP VESSEL MAXI BLUE (MISCELLANEOUS) IMPLANT
MARKER SKIN DUAL TIP RULER LAB (MISCELLANEOUS) ×2 IMPLANT
NEEDLE INSUFFLATION 14GA 120MM (NEEDLE) ×2 IMPLANT
POUCH SPECIMEN RETRIEVAL 10MM (ENDOMECHANICALS) ×2 IMPLANT
PROTECTOR NERVE ULNAR (MISCELLANEOUS) ×4 IMPLANT
SCISSORS LAP 5X45 EPIX DISP (ENDOMECHANICALS) ×2 IMPLANT
SEAL CANN UNIV 5-8 DVNC XI (MISCELLANEOUS) ×4 IMPLANT
SEAL XI 5MM-8MM UNIVERSAL (MISCELLANEOUS) ×4
SEALANT SURGICAL APPL DUAL CAN (MISCELLANEOUS) IMPLANT
SET TUBE SMOKE EVAC HIGH FLOW (TUBING) ×2 IMPLANT
SOLUTION ELECTROLUBE (MISCELLANEOUS) ×2 IMPLANT
STAPLE RELOAD 45 WHT (STAPLE) IMPLANT
STAPLE RELOAD 45MM WHITE (STAPLE)
SUT ETHILON 2 0 PSLX (SUTURE) IMPLANT
SUT MNCRL AB 4-0 PS2 18 (SUTURE) ×4 IMPLANT
SUT PDS AB 0 CT1 36 (SUTURE) IMPLANT
SUT V-LOC BARB 180 2/0GR6 GS22 (SUTURE)
SUT VIC AB 1 CT1 36 (SUTURE) ×8 IMPLANT
SUT VICRYL 0 UR6 27IN ABS (SUTURE) IMPLANT
SUT VLOC BARB 180 ABS3/0GR12 (SUTURE) ×4
SUTURE V-LC BRB 180 2/0GR6GS22 (SUTURE) IMPLANT
SUTURE VLOC BRB 180 ABS3/0GR12 (SUTURE) ×2 IMPLANT
TOWEL OR 17X26 10 PK STRL BLUE (TOWEL DISPOSABLE) ×2 IMPLANT
TOWEL OR NON WOVEN STRL DISP B (DISPOSABLE) ×2 IMPLANT
TRAY FOLEY MTR SLVR 16FR STAT (SET/KITS/TRAYS/PACK) ×2 IMPLANT
TRAY LAPAROSCOPIC (CUSTOM PROCEDURE TRAY) ×2 IMPLANT
TROCAR BLADELESS OPT 5 100 (ENDOMECHANICALS) IMPLANT
TROCAR UNIVERSAL OPT 12M 100M (ENDOMECHANICALS) IMPLANT
TROCAR XCEL 12X100 BLDLESS (ENDOMECHANICALS) ×2 IMPLANT
WATER STERILE IRR 1000ML POUR (IV SOLUTION) ×2 IMPLANT

## 2021-03-11 NOTE — Progress Notes (Signed)
1730- Pt lying in semi-fowler's position in bed with eyes closed. Pt is easily awakened when name is called. Abdomen soft and appropriately tender at operative sites. Operative sites x5 intact with dermabond. 35fr foley catheter draining blood tinged urine. SCD to bilateral lower extremities. Pt's sister at bedside. Will monitor. Call bell within reach.

## 2021-03-11 NOTE — Transfer of Care (Signed)
Immediate Anesthesia Transfer of Care Note  Patient: Patricia Dominguez  Procedure(s) Performed: XI ROBOTIC ASSITED PARTIAL NEPHRECTOMY (Left: Renal)  Patient Location: PACU  Anesthesia Type:General  Level of Consciousness: awake and drowsy  Airway & Oxygen Therapy: Patient Spontanous Breathing and Patient connected to face mask oxygen  Post-op Assessment: Report given to RN and Post -op Vital signs reviewed and stable  Post vital signs: Reviewed and stable  Last Vitals:  Vitals Value Taken Time  BP 190/77 03/11/21 1525  Temp    Pulse 72 03/11/21 1527  Resp 11 03/11/21 1527  SpO2 100 % 03/11/21 1527  Vitals shown include unvalidated device data.  Last Pain:  Vitals:   03/11/21 1053  TempSrc:   PainSc: 5          Complications: No notable events documented.

## 2021-03-11 NOTE — H&P (Signed)
PRE-OP H&P  Office Visit Report     02/19/2021   --------------------------------------------------------------------------------   Waldron Labs  MRN: [id number]  DOB: 1956-05-18, 65 year old Female  SSN:    PRIMARY CARE:  [name]  REFERRING:  Harrie Jeans, MD  PROVIDER:  Link Snuffer, Shirley Friar, M.D.  TREATING:  Ellison Hughs, M.D.  LOCATION:  [facility] [phone]     --------------------------------------------------------------------------------   CC/HPI: Left renal mass   Patricia Dominguez is a 65 year old female, referred by Dr. Gloriann Loan, for evaluation of a Bosniak 3 left renal cyst measuring approximately 1.7 x 1.6 cm. She has a significant past medical history of CKD stage IV and is currently being evaluated for a renal transplant by [facility] (not currently requiring dialysis).   Last serum creatinine: 2.6, eGFR=27  -Cystic, endophytic lesion involving the posterior aspects of the left kidney. Branching vasculature surrounds the mass.  -No personal/family history of GU malignancies  -Non-smoker   02/19/21: The patient is here today for a preoperative appointment leading up to her partial nephrectomy on 03/11/21. She denies any new health issues or interval episodes of flank pain or hematuria.   03/11/21. She denies any new health issues or interval episodes of flank pain or hematuria.     ALLERGIES: Iodine Dye Penicillin SULFA    MEDICATIONS: Aspirin 81 mg tablet,chewable  Alprazolam 0.5 mg tablet  Amlodipine Besylate 10 mg tablet  Atorvastatin Calcium 20 mg tablet  Carvedilol 6.25 mg tablet  Doxazosin Mesylate 2 mg tablet  Ferrous Sulfate  Furosemide 40 mg tablet  Gabapentin 300 mg capsule  Hydralazine Hcl  Oxycodone-Acetaminophen 10 mg-325 mg tablet     GU PSH: None   NON-GU PSH: Back surgery Bilateral Tubal Ligation Neck Surgery, x2     GU PMH: Chronic kidney disease stage 4 (GFR 15-30) - 09/11/2020 Left renal neoplasm - 0/03/9322 Left  uncertain neoplasm of kidney - 07/29/2020 Kidney Failure Unspec    NON-GU PMH: Anxiety Arthritis GERD Hypercholesterolemia Hypertension Myocardial Infarction Other heart failure    FAMILY HISTORY: 1 Daughter - Other Kidney Failure - Mother lupus - Runs in Family   SOCIAL HISTORY: Marital Status: Divorced Preferred Language: English; Race: Black or African American Current Smoking Status: Patient smokes occasionally.   Tobacco Use Assessment Completed: Used Tobacco in last 30 days? Does not drink caffeine.    REVIEW OF SYSTEMS:    GU Review Female:   Patient denies frequent urination, hard to postpone urination, burning /pain with urination, get up at night to urinate, leakage of urine, stream starts and stops, trouble starting your stream, have to strain to urinate, and being pregnant.  Gastrointestinal (Upper):   Patient denies nausea, vomiting, and indigestion/ heartburn.  Gastrointestinal (Lower):   Patient denies diarrhea and constipation.  Constitutional:   Patient denies fever, night sweats, weight loss, and fatigue.  Skin:   Patient denies skin rash/ lesion and itching.  Eyes:   Patient denies blurred vision and double vision.  Ears/ Nose/ Throat:   Patient denies sore throat and sinus problems.  Hematologic/Lymphatic:   Patient denies swollen glands and easy bruising.  Cardiovascular:   Patient denies leg swelling and chest pains.  Respiratory:   Patient denies cough and shortness of breath.  Endocrine:   Patient denies excessive thirst.  Musculoskeletal:   Patient denies back pain and joint pain.  Neurological:   Patient denies headaches and dizziness.  Psychologic:   Patient denies anxiety and depression.   VITAL SIGNS:  02/19/2021 01:27 PM  Weight 145 lb / 65.77 kg  BP 187/65 mmHg  Pulse 78 /min  Temperature 97.4 F / 36.3 C   MULTI-SYSTEM PHYSICAL EXAMINATION:    Constitutional: Well-nourished. No physical deformities. Normally developed. Good grooming.    Respiratory: No labored breathing, no use of accessory muscles.   Neurologic / Psychiatric: Patient anxious. Oriented to time, oriented to place, oriented to person. No depression, no agitation.   Gastrointestinal: No mass, no tenderness, no rigidity, non obese abdomen.     Complexity of Data:  Records Review:   Previous Patient Records  X-Ray Review: C.T. Abdomen/Pelvis: Reviewed Films. Reviewed Report. Discussed With Patient.     PROCEDURES:          Urinalysis w/Scope Dipstick Dipstick Cont'd Micro  Color: Yellow Bilirubin: Neg mg/dL WBC/hpf: NS (Not Seen)  Appearance: Clear Ketones: Neg mg/dL RBC/hpf: 0 - 2/hpf  Specific Gravity: 1.015 Blood: Neg ery/uL Bacteria: NS (Not Seen)  pH: 6.5 Protein: 1+ mg/dL Cystals: NS (Not Seen)  Glucose: Neg mg/dL Urobilinogen: 0.2 mg/dL Casts: NS (Not Seen)    Nitrites: Neg Trichomonas: Not Present    Leukocyte Esterase: Neg leu/uL Mucous: Not Present      Epithelial Cells: NS (Not Seen)      Yeast: NS (Not Seen)      Sperm: Not Present    ASSESSMENT:      ICD-10 Details  1 GU:   Left renal neoplasm - 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 We discussed that the mass in question has features concerning for malignancy. I explained the natural history of presumed renal cell carcinoma. I reviewed the AUA guidelines for evaluation and treatment of the small renal mass. The options of active surveillance, in situ tumor ablation, partial and radical nephrectomy was discussed. The risks of robotic LEFT partial nephrectomy were discussed in detail including but not limited to: negative pathology, open conversion, completion  nephrectomy, infection of the urinary tract/skin/abdominal cavity, VTE, MI/CVA, lymphatic leak, injury to adjacent solid/hollow viscus organs, bleeding requiring a blood transfusion, catastrophic bleeding, hernia formation, need for postoperative angioembolization, urinary leak requiring stent/drain, the need for hemodialysis and other imponderables.   -She is very anxious about having the surgery done and is tearful today. We briefly discussed the option of arranging a referral to [facility] due to insurance incompatibility with my practice, but she is insistent on having the surgery in [place] due to concerns about traveling.

## 2021-03-11 NOTE — Discharge Instructions (Signed)

## 2021-03-11 NOTE — Anesthesia Procedure Notes (Signed)
Procedure Name: Intubation Date/Time: 03/11/2021 11:49 AM Performed by: Lollie Sails, CRNA Pre-anesthesia Checklist: Patient identified, Emergency Drugs available, Suction available, Patient being monitored and Timeout performed Patient Re-evaluated:Patient Re-evaluated prior to induction Oxygen Delivery Method: Circle system utilized Preoxygenation: Pre-oxygenation with 100% oxygen Induction Type: IV induction Ventilation: Mask ventilation without difficulty Laryngoscope Size: Miller and 3 Grade View: Grade I Tube type: Oral Tube size: 7.5 mm Number of attempts: 1 Airway Equipment and Method: Stylet Placement Confirmation: ETT inserted through vocal cords under direct vision, positive ETCO2 and breath sounds checked- equal and bilateral Secured at: 23 cm Tube secured with: Tape Dental Injury: Teeth and Oropharynx as per pre-operative assessment

## 2021-03-11 NOTE — Op Note (Signed)
Operative Note  Preoperative diagnosis:  1.  1.5 cm Bosniak 3 left renal mass 2.  History of stage 4 CKD  Postoperative diagnosis: 1.  1.  1.5 cm Bosniak 3 left renal mass 2.  History of stage 4 CKD  Procedure(s): 1.  Robot-assisted laparoscopic left partial nephrectomy 2.  Intraoperative ultrasound of single retroperitoneal organ  Surgeon: Ellison Hughs, MD  Assistants: 1.  Debbrah Alar, PA-C An assistant was required for this surgical procedure.  The duties of the assistant included but were not limited to suctioning, passing suture, camera manipulation, retraction.  This procedure would not be able to be performed without an Environmental consultant.  2.  Reola Mosher, MD PGY 4  Anesthesia:  General  Complications:  None  EBL: 150 mL  Specimens: 1.  Left renal mass  Drains/Catheters: 1.  Foley catheter  Intraoperative findings:   Intraoperative ultrasound revealed a 1.5 cm cystic lesion involving the upper and medial aspects of the left kidney with a thin septation and possible internal nodularity. Grossly negative margins following excision of left renal mass  Indication:  Patricia Dominguez is a 65 y.o. female with history of stage IV CKD and a Bosniak 3 left renal lesion with features concerning for malignancy.  She has been consented for the above procedures, voices understanding and wishes to proceed.  Description of procedure:  After informed consent was obtained, the patient was brought to the operating room and general endotracheal anesthesia was administered.  The patient was then placed in the right lateral decubitus position and prepped and draped in usual sterile fashion.  A timeout was performed.  An 8 mm incision was then made lateral to the left rectus muscle at the level of the left 12th rib.  Abdominal access was obtained via a Veress needle.  The abdominal cavity was then insufflated up to 15 mmHg.  An 8 mm port was then introduced into the abdominal cavity.   Inspection of the port entry site by the robotic camera revealed no adjacent organ injury.  We then placed 3 additional 8 mm robotic ports to triangulate the left renal hilum.  A 12 mm assistant port was then placed between the carmera port and 3rd robotic arm.  The white line of Toldt along the descending colon was incised sharply and the colon, along with its mesocolonic fat, was reflected medially until the aorta was identified.  We then made a small window adjacent to the lower pole of the left kidney, identifying the left psoas muscle, left ureter and left gonadal vein.  The left ureter and gonadal vein were then reflected anteriorly allowing [place] to then incised the perihilar attachments using electrocautery.  We encountered a small lumbar vein adjacent to the insertion of the left gonadal vein into the left renal vein.  This lumbar vein was ligated with hemo-lock clips in 2 places and incised sharply.  This provided [place] excellent exposure to the left renal hilum.  The perilymphatic tissue surrounding the left renal artery was carefully dissected away, creating a window to place a bulldog clamp later in the procedure.  The anterior portion of Gerota's fascia was incised, allowing reflection the perinephric fat medially and laterally until there was adequate exposure of the upper pole left renal mass.  Intraoperative ultrasound confirmed the complex cystic nature of the lesion compared to the remainder of the renal parenchyma and allowed identification of the depth/borders of the mass, which were demarcated using electrocautery along the renal capsule.  We then exposed  the left renal artery and placed a bulldog clamp, marking warm ischemia time.  The left kidney immediately became ischemic and pale in appearance.  The left renal mass was then sharply excised with minimal blood loss.  After the mass was free, it was placed in the left upper quadrant to be retrieved later on during the operation.    A series  of interrupted sutures were used to reapproximate the renal capsule using interupted 1-0 Vicryl suture along with hemo-lock clips act as a buttress against the renal capsule.  Once adequate tension was placed on the renorrhaphy sutures and the repair appeared hemostatic, we then removed the bulldog clamp marking the end of warm ischemia time at 13 minutes.  Vistaseal and Surgicel were then applied to the renal artery to aid in hemostasis.  There did not appear to be any obvious bleeding around the renal hilum nor surrounding our repair.  The incised Gerota's fascia overlying the mass was then reapproximated using a running 2-0 V lock suture.  The mass was then placed in an Endo Catch bag.  The robot was then de-docked and the camera was then reinserted into the assistant port. Laparoscopic graspers were then used to grab the string of the Endo Catch bag, which was brought out through the 12 mm assistant port.  The abdomen was then desufflated and all ports were removed.  The assistant port incision was then extended approximately 1 cm and the left renal mass, within the Endo Catch bag, was removed and sent to pathology for permanent section.  The fascia within the assistant port incision was then reapproximated using a 0 Vicryl suture.  The remainder of the incisions were then closed using 4-0 Monocryl and dressed appropriately.  Patient tolerated the procedure well and was transferred to the postanesthesia unit in stable condition.   Plan: Monitor on the floor.  Nephrology will be consulted to follow her renal function while in the hospital.

## 2021-03-11 NOTE — Anesthesia Postprocedure Evaluation (Signed)
Anesthesia Post Note  Patient: Patricia Dominguez  Procedure(s) Performed: XI ROBOTIC ASSITED PARTIAL NEPHRECTOMY (Left: Renal)     Patient location during evaluation: PACU Anesthesia Type: General Level of consciousness: sedated Pain management: pain level controlled Vital Signs Assessment: post-procedure vital signs reviewed and stable Respiratory status: spontaneous breathing and respiratory function stable Cardiovascular status: stable Postop Assessment: no apparent nausea or vomiting Anesthetic complications: no   No notable events documented.  Last Vitals:  Vitals:   03/11/21 1600 03/11/21 1615  BP: (!) 195/77 (!) 176/69  Pulse: 69 63  Resp: 12 10  Temp: 36.9 C   SpO2: 100% 100%    Last Pain:  Vitals:   03/11/21 1605  TempSrc:   PainSc: 4                  Nicolemarie Wooley DANIEL

## 2021-03-12 ENCOUNTER — Encounter (HOSPITAL_COMMUNITY): Payer: Self-pay

## 2021-03-12 ENCOUNTER — Encounter (HOSPITAL_COMMUNITY): Payer: Self-pay | Admitting: Urology

## 2021-03-12 ENCOUNTER — Other Ambulatory Visit: Payer: Self-pay

## 2021-03-12 DIAGNOSIS — Z880 Allergy status to penicillin: Secondary | ICD-10-CM | POA: Diagnosis not present

## 2021-03-12 DIAGNOSIS — I252 Old myocardial infarction: Secondary | ICD-10-CM | POA: Diagnosis not present

## 2021-03-12 DIAGNOSIS — D3002 Benign neoplasm of left kidney: Secondary | ICD-10-CM | POA: Diagnosis present

## 2021-03-12 DIAGNOSIS — Z882 Allergy status to sulfonamides status: Secondary | ICD-10-CM | POA: Diagnosis not present

## 2021-03-12 DIAGNOSIS — Z7982 Long term (current) use of aspirin: Secondary | ICD-10-CM | POA: Diagnosis not present

## 2021-03-12 DIAGNOSIS — Z79899 Other long term (current) drug therapy: Secondary | ICD-10-CM | POA: Diagnosis not present

## 2021-03-12 DIAGNOSIS — I129 Hypertensive chronic kidney disease with stage 1 through stage 4 chronic kidney disease, or unspecified chronic kidney disease: Secondary | ICD-10-CM | POA: Diagnosis present

## 2021-03-12 DIAGNOSIS — N281 Cyst of kidney, acquired: Secondary | ICD-10-CM | POA: Diagnosis present

## 2021-03-12 DIAGNOSIS — M329 Systemic lupus erythematosus, unspecified: Secondary | ICD-10-CM | POA: Diagnosis present

## 2021-03-12 DIAGNOSIS — E785 Hyperlipidemia, unspecified: Secondary | ICD-10-CM | POA: Diagnosis present

## 2021-03-12 DIAGNOSIS — F32A Depression, unspecified: Secondary | ICD-10-CM | POA: Diagnosis present

## 2021-03-12 DIAGNOSIS — D631 Anemia in chronic kidney disease: Secondary | ICD-10-CM | POA: Diagnosis present

## 2021-03-12 DIAGNOSIS — F17210 Nicotine dependence, cigarettes, uncomplicated: Secondary | ICD-10-CM | POA: Diagnosis present

## 2021-03-12 DIAGNOSIS — Z841 Family history of disorders of kidney and ureter: Secondary | ICD-10-CM | POA: Diagnosis not present

## 2021-03-12 DIAGNOSIS — Z91041 Radiographic dye allergy status: Secondary | ICD-10-CM | POA: Diagnosis not present

## 2021-03-12 DIAGNOSIS — F419 Anxiety disorder, unspecified: Secondary | ICD-10-CM | POA: Diagnosis present

## 2021-03-12 DIAGNOSIS — N184 Chronic kidney disease, stage 4 (severe): Secondary | ICD-10-CM | POA: Diagnosis present

## 2021-03-12 DIAGNOSIS — N2889 Other specified disorders of kidney and ureter: Secondary | ICD-10-CM | POA: Diagnosis present

## 2021-03-12 DIAGNOSIS — K219 Gastro-esophageal reflux disease without esophagitis: Secondary | ICD-10-CM | POA: Diagnosis present

## 2021-03-12 DIAGNOSIS — Z20822 Contact with and (suspected) exposure to covid-19: Secondary | ICD-10-CM | POA: Diagnosis present

## 2021-03-12 DIAGNOSIS — M1990 Unspecified osteoarthritis, unspecified site: Secondary | ICD-10-CM | POA: Diagnosis present

## 2021-03-12 DIAGNOSIS — Z90710 Acquired absence of both cervix and uterus: Secondary | ICD-10-CM | POA: Diagnosis not present

## 2021-03-12 LAB — BASIC METABOLIC PANEL
Anion gap: 6 (ref 5–15)
BUN: 42 mg/dL — ABNORMAL HIGH (ref 8–23)
CO2: 25 mmol/L (ref 22–32)
Calcium: 8.8 mg/dL — ABNORMAL LOW (ref 8.9–10.3)
Chloride: 107 mmol/L (ref 98–111)
Creatinine, Ser: 2.53 mg/dL — ABNORMAL HIGH (ref 0.44–1.00)
GFR, Estimated: 21 mL/min — ABNORMAL LOW (ref >60)
Glucose, Bld: 114 mg/dL — ABNORMAL HIGH (ref 70–99)
Potassium: 4.2 mmol/L (ref 3.5–5.1)
Sodium: 138 mmol/L (ref 135–145)

## 2021-03-12 LAB — SURGICAL PATHOLOGY

## 2021-03-12 LAB — HEMOGLOBIN AND HEMATOCRIT, BLOOD
HCT: 33.2 % — ABNORMAL LOW (ref 36.0–46.0)
Hemoglobin: 10.4 g/dL — ABNORMAL LOW (ref 12.0–15.0)

## 2021-03-12 MED ORDER — OXYCODONE HCL 5 MG PO TABS
10.0000 mg | ORAL_TABLET | ORAL | Status: DC | PRN
  Administered 2021-03-12: 15 mg via ORAL
  Administered 2021-03-13: 10 mg via ORAL
  Filled 2021-03-12: qty 2
  Filled 2021-03-12: qty 3

## 2021-03-12 MED ORDER — CALCIUM CARBONATE ANTACID 500 MG PO CHEW
1.0000 | CHEWABLE_TABLET | Freq: Three times a day (TID) | ORAL | Status: DC | PRN
  Administered 2021-03-12: 200 mg via ORAL
  Filled 2021-03-12: qty 1

## 2021-03-12 MED ORDER — CHLORHEXIDINE GLUCONATE CLOTH 2 % EX PADS: 6.0000 | MEDICATED_PAD | Freq: Every day | CUTANEOUS | Status: DC

## 2021-03-12 NOTE — Consult Note (Signed)
Reason for Consult: Chronic kidney disease stage IV Referring Physician: Ellison Hughs MD (Urology)  HPI:  65 year old woman with past medical history significant for dyslipidemia, hypertension, PVCs, MGUS, degenerative disc disease, multifocal arthralgias and positive antinuclear antibody without clear indication of SLE.  She also has chronic kidney disease stage IV for which she follows up with Dr. Royce Macadamia at [facility] and has been undergoing evaluation for kidney transplant listing that revealed a Bosniak 3 left renal cyst.  Yesterday she underwent robotic assisted laparoscopic left partial nephrectomy without significant intraoperative complications.  Nephrology is consulted for continued surveillance of renal function (baseline creatinine around 2.6).  Past Medical History:  Diagnosis Date   Arthritis    Back pain    Breast pain    Chronic kidney disease    Complication of anesthesia    woke up during colonoscopy   Depression    GERD (gastroesophageal reflux disease)    Hyperlipidemia    Hypertension    Neck pain    PONV (postoperative nausea and vomiting)    PVCs (premature ventricular contractions)    occasional PVCs on event monitor 12/2020   Systemic lupus erythematosus ([place])     Past Surgical History:  Procedure Laterality Date   ABDOMINAL HYSTERECTOMY     APPENDECTOMY     arm surgery Left    CERVICAL SPINE SURGERY     ROBOTIC ASSITED PARTIAL NEPHRECTOMY Left 03/11/2021   Procedure: XI ROBOTIC ASSITED PARTIAL NEPHRECTOMY;  Surgeon: Ceasar Mons, MD;  Location: [facility];  Service: Urology;  Laterality: Left;    Family History  Problem Relation Age of Onset   Kidney failure Mother    Heart attack Father 29   Heart attack Sister 41    Social History:  reports that she quit smoking about 8 weeks ago. Her smoking use included cigarettes. She has never used smokeless tobacco. She reports that she does not drink alcohol and does not use  drugs.  Allergies:  Allergies  Allergen Reactions   Ivp Dye [Iodinated Diagnostic Agents] Nausea And Vomiting   Iodine Other (See Comments)    Pt unsure of reaction    Penicillins Other (See Comments)    Passed out after penicillin injection at 65 years old. Has tolerated as an adult.   Sulfa Antibiotics Other (See Comments)    Unknown allergic reaction    Medications: I have reviewed the patient's current medications. Scheduled:  acetaminophen  1,000 mg Oral Q6H   ALPRAZolam  0.5 mg Oral BID   amLODipine  10 mg Oral Daily   atorvastatin  20 mg Oral QHS   carvedilol  6.25 mg Oral BID WC   doxazosin  2 mg Oral Q supper   gabapentin  300 mg Oral TID   senna-docusate  2 tablet Oral QHS   BMP Latest Ref Rng & Units 03/12/2021 03/09/2021 01/13/2021  Glucose 70 - 99 mg/dL 114(H) 131(H) 99  BUN 8 - 23 mg/dL 42(H) 53(H) 39(H)  Creatinine 0.44 - 1.00 mg/dL 2.53(H) 2.60(H) 2.05(H)  BUN/Creat Ratio 12 - 28 - - 19  Sodium 135 - 145 mmol/L 138 144 136  Potassium 3.5 - 5.1 mmol/L 4.2 4.6 4.8  Chloride 98 - 111 mmol/L 107 109 99  CO2 22 - 32 mmol/L 25 26 22   Calcium 8.9 - 10.3 mg/dL 8.8(L) 9.6 9.1   CBC Latest Ref Rng & Units 03/12/2021 03/11/2021 03/09/2021  WBC 4.0 - 10.5 K/uL - - 7.3  Hemoglobin 12.0 - 15.0 g/dL 10.4(L)  12.1 11.6(L)  Hematocrit 36.0 - 46.0 % 33.2(L) 38.9 38.0  Platelets 150 - 400 K/uL - - 260     Review of Systems  Constitutional: Negative.   HENT: Negative.    Eyes: Negative.   Respiratory: Negative.    Cardiovascular: Negative.   Gastrointestinal:  Positive for abdominal pain.       Associated with recent surgery  Endocrine: Negative.   Genitourinary:  Positive for flank pain and hematuria.       Associated with recent surgery  Allergic/Immunologic: Negative.   Neurological:  Positive for weakness.  Blood pressure (!) 148/63, pulse 65, temperature 99.3 F (37.4 C), temperature source Oral, resp. rate 14, weight 68 kg, SpO2 99 %. Physical Exam Vitals and  nursing note reviewed.  Constitutional:      Appearance: She is normal weight. She is ill-appearing.     Comments: Appears in discomfort, hugging pillow/leaning forward  HENT:     Head: Normocephalic and atraumatic.     Right Ear: External ear normal.     Left Ear: External ear normal.     Nose: Nose normal.     Mouth/Throat:     Mouth: Mucous membranes are moist.     Pharynx: Oropharynx is clear.  Eyes:     General: No scleral icterus.    Extraocular Movements: Extraocular movements intact.     Conjunctiva/sclera: Conjunctivae normal.  Cardiovascular:     Rate and Rhythm: Normal rate and regular rhythm.     Pulses: Normal pulses.     Heart sounds: Normal heart sounds.  Pulmonary:     Effort: Pulmonary effort is normal.     Breath sounds: Normal breath sounds. No wheezing or rales.  Abdominal:     Palpations: Abdomen is soft.     Tenderness: There is guarding.     Comments: Surgical site scars intact  Musculoskeletal:     Cervical back: Normal range of motion and neck supple.     Right lower leg: No edema.     Left lower leg: No edema.  Skin:    General: Skin is warm and dry.     Findings: No bruising.  Neurological:     Mental Status: She is alert and oriented to person, place, and time.    Assessment/Plan: 1.  Chronic kidney disease stage IV status post partial left nephrectomy: Excellent urine output with stable renal function overnight based on labs from this morning.  Volume status indicated she is euvolemic and electrolytes are within acceptable range. Avoid nephrotoxic medications including NSAIDs and iodinated intravenous contrast exposure unless the latter is absolutely indicated.  Preferred narcotic agents for pain control are hydromorphone, fentanyl, and methadone. Morphine should not be used. Avoid Baclofen and avoid oral sodium phosphate and magnesium citrate based laxatives / bowel preps. Continue strict Input and Output monitoring. Will monitor the patient  closely with you and intervene or adjust therapy as indicated by changes in clinical status/labs. 2.  Hypertension: Blood pressure acceptable with intermittent elevations as expected from postop pain.  We will continue to follow to determine need to titrate medications. 3. Anemia: Small hemoglobin and hematocrit drop noted overnight as expected postoperatively. I will monitor trend with CBC tomorrow morning to decide on need for PRBC transfusion versus ESA.   Cathren Sween K. 03/12/2021, 12:00 PM

## 2021-03-12 NOTE — Progress Notes (Signed)
Urology Progress Note   1 Day Post-Op from left partial nephrectomy.   Subjective: NAEON.  AF, VSS (slightly hypertensive) Pain well controlled No nausea/vomiting No ambulation yet Foley catheter removed overnight Cr stable at 2.5  Objective: Vital signs in last 24 hours: Temp:  [98.4 F (36.9 C)-99.7 F (37.6 C)] 99.3 F (37.4 C) (09/29 0410) Pulse Rate:  [63-74] 68 (09/29 0410) Resp:  [10-24] 16 (09/29 0410) BP: (173-195)/(67-83) 174/79 (09/29 0410) SpO2:  [98 %-100 %] 99 % (09/29 0410) Weight:  [68 kg] 68 kg (09/28 1052)  Intake/Output from previous day: 09/28 0701 - 09/29 0700 In: 1804.1 [I.V.:1676.4; IV Piggyback:127.7] Out: 38756 [Urine:2865; EPPIR:51884; Blood:150] Intake/Output this shift: No intake/output data recorded.  Physical Exam:  General: Alert and oriented CV: Regular rate Lungs: No increased work of breathing Abdomen: Soft, appropriately tender. Incisions c/d/i. Ext: NT, No erythema  Lab Results: Recent Labs    03/09/21 1444 03/11/21 1600 03/12/21 0432  HGB 11.6* 12.1 10.4*  HCT 38.0 38.9 33.2*   Recent Labs    03/09/21 1444 03/12/21 0432  NA 144 138  K 4.6 4.2  CL 109 107  CO2 26 25  GLUCOSE 131* 114*  BUN 53* 42*  CREATININE 2.60* 2.53*  CALCIUM 9.6 8.8*    Studies/Results: No results found.  Assessment/Plan:  65 y.o. female s/p left partial nephrectomy.  Overall doing well post-op.   - Advance to renal diet as tolerated - Medlock - Foley removed, proceed with TOV today - Consult Nephrology for post-op recommendations following parital nephrectomy in patient w/ CKD Stage 4 - Ambulate today - Wean O2 - Trend Cr  Dispo: Expected discharge later today or tomorrow (likely)   LOS: 0 days   Reola Mosher, MD [facility] Urology Resident, [facility]

## 2021-03-13 LAB — RENAL FUNCTION PANEL
Albumin: 3.7 g/dL (ref 3.5–5.0)
Anion gap: 9 (ref 5–15)
BUN: 43 mg/dL — ABNORMAL HIGH (ref 8–23)
CO2: 22 mmol/L (ref 22–32)
Calcium: 9.3 mg/dL (ref 8.9–10.3)
Chloride: 110 mmol/L (ref 98–111)
Creatinine, Ser: 2.69 mg/dL — ABNORMAL HIGH (ref 0.44–1.00)
GFR, Estimated: 19 mL/min — ABNORMAL LOW (ref >60)
Glucose, Bld: 109 mg/dL — ABNORMAL HIGH (ref 70–99)
Phosphorus: 3.9 mg/dL (ref 2.5–4.6)
Potassium: 4.9 mmol/L (ref 3.5–5.1)
Sodium: 141 mmol/L (ref 135–145)

## 2021-03-13 LAB — CBC
HCT: 31.3 % — ABNORMAL LOW (ref 36.0–46.0)
Hemoglobin: 9.9 g/dL — ABNORMAL LOW (ref 12.0–15.0)
MCH: 22 pg — ABNORMAL LOW (ref 26.0–34.0)
MCHC: 31.6 g/dL (ref 30.0–36.0)
MCV: 69.6 fL — ABNORMAL LOW (ref 80.0–100.0)
Platelets: 201 10*3/uL (ref 150–400)
RBC: 4.5 MIL/uL (ref 3.87–5.11)
RDW: 16.2 % — ABNORMAL HIGH (ref 11.5–15.5)
WBC: 12.2 10*3/uL — ABNORMAL HIGH (ref 4.0–10.5)
nRBC: 0 % (ref 0.0–0.2)

## 2021-03-13 NOTE — Progress Notes (Signed)
Patient ID: Patricia Dominguez, female   DOB: Oct 01, 1955, 65 y.o.   MRN: [id number] [facility] Progress Note   Assessment/ Plan:   1.  Chronic kidney disease stage IV status post partial left nephrectomy: Nonoliguric overnight with minimal rise of creatinine as would be expected post partial nephrectomy, she does not have any acute electrolyte abnormalities or volume concerns to prompt intervention.  She had a low-grade fever earlier this morning without any focal signs of infection. 2.  Hypertension: Blood pressure acceptable with intermittent elevations as expected from postop pain.  Continue to monitor as an outpatient for additional titration of therapy. 3. Anemia: Small hemoglobin and hematocrit drop noted overnight as expected postoperatively.  Recommend continued monitoring as an outpatient to decide on need for intravenous iron versus ESA.  Subjective:   Denies any complaints other than surgical site discomfort   Objective:   BP (!) 157/61   Pulse 66   Temp 100.3 F (37.9 C) (Oral)   Resp 16   Ht 5\' 7"  (1.702 m)   Wt 68 kg   SpO2 94%   BMI 23.49 kg/m   Intake/Output Summary (Last 24 hours) at 03/13/2021 1155 Last data filed at 03/13/2021 0400 Gross per 24 hour  Intake 480 ml  Output 1300 ml  Net -820 ml   Weight change:   Physical Exam: Gen: Resting comfortably in bed, intermittently sleeping CVS: Pulse regular rhythm, normal rate, S1 and S2 normal Resp: Clear to auscultation bilaterally, no rales/rhonchi Abd: Not examined to limit patient discomfort Ext: No lower extremity edema  Imaging: No results found.  Labs: BMET Recent Labs  Lab 03/09/21 1444 03/12/21 0432 03/13/21 0455  NA 144 138 141  K 4.6 4.2 4.9  CL 109 107 110  CO2 26 25 22   GLUCOSE 131* 114* 109*  BUN 53* 42* 43*  CREATININE 2.60* 2.53* 2.69*  CALCIUM 9.6 8.8* 9.3  PHOS  --   --  3.9   CBC Recent Labs  Lab 03/09/21 1444 03/11/21 1600 03/12/21 0432 03/13/21 0455  WBC 7.3   --   --  12.2*  HGB 11.6* 12.1 10.4* 9.9*  HCT 38.0 38.9 33.2* 31.3*  MCV 71.6*  --   --  69.6*  PLT 260  --   --  201   Medications:     acetaminophen  1,000 mg Oral Q6H   ALPRAZolam  0.5 mg Oral BID   amLODipine  10 mg Oral Daily   atorvastatin  20 mg Oral QHS   carvedilol  6.25 mg Oral BID WC   doxazosin  2 mg Oral Q supper   gabapentin  300 mg Oral TID   senna-docusate  2 tablet Oral QHS   Elmarie Shiley, MD 03/13/2021, 11:55 AM '

## 2021-03-13 NOTE — Plan of Care (Signed)
  Problem: Education: Goal: Knowledge of General Education information will improve Description: Including pain rating scale, medication(s)/side effects and non-pharmacologic comfort measures 03/13/2021 1358 by Lennie Hummer, RN Outcome: Adequate for Discharge 03/13/2021 1358 by Lennie Hummer, RN Outcome: Adequate for Discharge 03/13/2021 1157 by Lennie Hummer, RN Outcome: Progressing   Problem: Health Behavior/Discharge Planning: Goal: Ability to manage health-related needs will improve 03/13/2021 1358 by Lennie Hummer, RN Outcome: Adequate for Discharge 03/13/2021 1358 by Lennie Hummer, RN Outcome: Adequate for Discharge   Problem: Clinical Measurements: Goal: Ability to maintain clinical measurements within normal limits will improve 03/13/2021 1358 by Lennie Hummer, RN Outcome: Adequate for Discharge 03/13/2021 1358 by Lennie Hummer, RN Outcome: Adequate for Discharge Goal: Will remain free from infection 03/13/2021 1358 by Lennie Hummer, RN Outcome: Adequate for Discharge 03/13/2021 1358 by Lennie Hummer, RN Outcome: Adequate for Discharge 03/13/2021 1157 by Lennie Hummer, RN Outcome: Progressing Goal: Diagnostic test results will improve 03/13/2021 1358 by Lennie Hummer, RN Outcome: Adequate for Discharge 03/13/2021 1358 by Lennie Hummer, RN Outcome: Adequate for Discharge Goal: Respiratory complications will improve 03/13/2021 1358 by Lennie Hummer, RN Outcome: Adequate for Discharge 03/13/2021 1358 by Lennie Hummer, RN Outcome: Adequate for Discharge 03/13/2021 1157 by Lennie Hummer, RN Outcome: Progressing Goal: Cardiovascular complication will be avoided 03/13/2021 1358 by Lennie Hummer, RN Outcome: Adequate for Discharge 03/13/2021 1358 by Lennie Hummer, RN Outcome: Adequate for Discharge   Problem: Activity: Goal: Risk for activity intolerance will decrease 03/13/2021 1358 by Lennie Hummer, RN Outcome: Adequate for Discharge 03/13/2021  1358 by Lennie Hummer, RN Outcome: Adequate for Discharge 03/13/2021 1157 by Lennie Hummer, RN Outcome: Progressing   Problem: Nutrition: Goal: Adequate nutrition will be maintained 03/13/2021 1358 by Lennie Hummer, RN Outcome: Adequate for Discharge 03/13/2021 1358 by Lennie Hummer, RN Outcome: Adequate for Discharge   Problem: Coping: Goal: Level of anxiety will decrease 03/13/2021 1358 by Lennie Hummer, RN Outcome: Adequate for Discharge 03/13/2021 1358 by Lennie Hummer, RN Outcome: Adequate for Discharge   Problem: Elimination: Goal: Will not experience complications related to bowel motility 03/13/2021 1358 by Lennie Hummer, RN Outcome: Adequate for Discharge 03/13/2021 1358 by Lennie Hummer, RN Outcome: Adequate for Discharge Goal: Will not experience complications related to urinary retention 03/13/2021 1358 by Lennie Hummer, RN Outcome: Adequate for Discharge 03/13/2021 1358 by Lennie Hummer, RN Outcome: Adequate for Discharge   Problem: Pain Managment: Goal: General experience of comfort will improve 03/13/2021 1358 by Lennie Hummer, RN Outcome: Adequate for Discharge 03/13/2021 1358 by Lennie Hummer, RN Outcome: Adequate for Discharge 03/13/2021 1157 by Lennie Hummer, RN Outcome: Progressing   Problem: Safety: Goal: Ability to remain free from injury will improve 03/13/2021 1358 by Lennie Hummer, RN Outcome: Adequate for Discharge 03/13/2021 1358 by Lennie Hummer, RN Outcome: Adequate for Discharge 03/13/2021 1157 by Lennie Hummer, RN Outcome: Progressing   Problem: Skin Integrity: Goal: Risk for impaired skin integrity will decrease 03/13/2021 1358 by Lennie Hummer, RN Outcome: Adequate for Discharge 03/13/2021 1358 by Lennie Hummer, RN Outcome: Adequate for Discharge

## 2021-03-13 NOTE — Discharge Summary (Signed)
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 Briefly, Patricia Dominguez is a 65 y.o. 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   Hospital Course: Patient tolerated the procedure well.  She was then transferred to the floor after an uneventful PACU stay.  Her hospital course was uncomplicated.  On POD#2 she had met discharge criteria: was eating a regular diet, was up and ambulating independently,  pain was well controlled, was voiding without a catheter, and was ready to for discharge.   Laboratory values:  Recent Labs    03/11/21 1600 03/12/21 0432 03/13/21 0455  WBC  --   --  12.2*  HGB 12.1 10.4* 9.9*  HCT 38.9 33.2* 31.3*   Recent Labs    03/12/21 0432 03/13/21 0455  NA 138 141  K 4.2 4.9  CL 107 110  CO2 25 22  GLUCOSE 114* 109*  BUN 42* 43*  CREATININE 2.53* 2.69*  CALCIUM 8.8* 9.3   No results for input(s): LABPT, INR in the last 72 hours. No results for input(s): LABURIN in the last 72 hours. Results for orders placed or performed during the hospital encounter of 03/11/21  SARS Coronavirus 2 by RT PCR (hospital order, performed in [facility] hospital lab) Nasopharyngeal Nasopharyngeal Swab     Status:  None   Collection Time: 03/11/21 10:26 AM   Specimen: Nasopharyngeal Swab  Result Value Ref Range Status   SARS Coronavirus 2 NEGATIVE NEGATIVE Final    Comment: (NOTE) SARS-CoV-2 target nucleic acids are NOT DETECTED.  The SARS-CoV-2 RNA is generally detectable in upper and lower respiratory specimens during the acute phase of infection. The lowest concentration of SARS-CoV-2 viral copies this assay can detect is 250 copies / mL. A negative result does not preclude SARS-CoV-2 infection and should not be used as the sole basis for treatment or other patient management decisions.  A negative result may occur with improper specimen collection / handling, submission of specimen other than nasopharyngeal swab, presence of viral mutation(s) within the areas targeted by this assay, and inadequate number of viral copies (<250 copies / mL). A negative result must be combined with clinical observations, patient history, and epidemiological information.  Fact Sheet for Patients:   StrictlyIdeas.no  Fact Sheet for Healthcare Providers: BankingDealers.co.za  This test is not yet approved or  cleared by the [place] FDA and has been authorized for detection and/or diagnosis of SARS-CoV-2 by FDA under an Emergency Use Authorization (EUA).  This EUA will remain in effect (meaning this test can be used) for the duration of the COVID-19 declaration under Section 564(b)(1) of the Act, 21 U.S.C. section 360bbb-3(b)(1), unless the authorization is terminated or revoked sooner.  Performed at [facility], [address]  [place], [place] [address]     Disposition: Home  Discharge instruction: The patient was instructed to be ambulatory but told to refrain from heavy lifting, strenuous activity, or driving.  Discharge medications:  Allergies as of 03/13/2021       Reactions   Ivp Dye [iodinated Diagnostic Agents] Nausea And  Vomiting   Iodine Other (See Comments)   Pt unsure of reaction    Penicillins Other (See Comments)   Passed out after penicillin injection at 66 years old. Has tolerated as an adult.   Sulfa Antibiotics Other (See Comments)   Unknown allergic reaction        Medication List     STOP taking these medications    aspirin EC 81 MG tablet   B-complex with vitamin C tablet   calcium carbonate 1250 (500 Ca) MG tablet Commonly known as: OS-CAL - dosed in mg of elemental calcium   Vitamin D-3 125 MCG (5000 UT) Tabs       TAKE these medications    ALPRAZolam 0.5 MG tablet Commonly known as: XANAX Take 0.5 mg by mouth 2 (two) times daily.   amLODipine 10 MG tablet Commonly known as: NORVASC Take 10 mg by mouth daily.   ARIPiprazole 2 MG tablet Commonly known as: ABILIFY Take 2 mg by mouth daily as needed for anxiety.   atorvastatin 20 MG tablet Commonly known as: LIPITOR Take 20 mg by mouth at bedtime.   BEN GAY GREASELESS 10-15 % greaseless cream Apply 1 application topically 3 (three) times daily as needed for pain.   carvedilol 6.25 MG tablet Commonly known as: COREG TAKE 1 TABLET(6.25 MG) BY MOUTH TWICE DAILY   docusate sodium 100 MG capsule Commonly known as: COLACE Take 1 capsule (100 mg total) by mouth 2 (two) times daily.   doxazosin 2 MG tablet Commonly known as: CARDURA Take 2 mg by mouth daily with supper.   ferrous sulfate 325 (65 FE) MG tablet Take 325 mg by mouth daily with breakfast.   furosemide 40 MG tablet Commonly known as: LASIX Take 40 mg by mouth every Monday, Wednesday, and Friday.   gabapentin 300 MG capsule Commonly known as: NEURONTIN Take 300 mg by mouth 3 (three) times daily.   hydrALAZINE 100 MG tablet Commonly known as: APRESOLINE Take 100 mg by mouth 3 (three) times daily.   Linzess 145 MCG Caps capsule Generic drug: linaclotide Take 145 mcg by mouth daily as needed for constipation.   Narcan 4 MG/0.1ML Liqd nasal  spray kit Generic drug: naloxone Place 1 spray into the nose as needed. Overdose   ondansetron 4 MG disintegrating tablet Commonly known as: ZOFRAN-ODT Take 4 mg by mouth every 8 (eight) hours as needed for nausea or vomiting.   oxyCODONE-acetaminophen 10-325 MG tablet Commonly known as: PERCOCET Take 1 tablet by mouth every 4 (four) hours as needed for pain.   polyvinyl alcohol 1.4 % ophthalmic solution Commonly known as: LIQUIFILM TEARS Place 1 drop into both eyes as needed for dry eyes.   promethazine 12.5 MG tablet Commonly known as: PHENERGAN Take 1 tablet (12.5 mg total) by mouth every 4 (four) hours as needed for nausea or vomiting.   sodium bicarbonate 650 MG tablet Take 650 mg by mouth 2 (two) times daily.   zolpidem 5 MG tablet Commonly known as: AMBIEN Take 5 mg by mouth at bedtime as needed for sleep.        Followup:   Follow-up Information     Ceasar Mons,  MD Follow up on 03/26/2021.   Specialty: Urology Why: at 8:15 Contact information: [address] [place] [address] [phone]

## 2021-03-13 NOTE — Plan of Care (Signed)
  Problem: Education: Goal: Knowledge of General Education information will improve Description Including pain rating scale, medication(s)/side effects and non-pharmacologic comfort measures Outcome: Progressing   Problem: Clinical Measurements: Goal: Will remain free from infection Outcome: Progressing Goal: Respiratory complications will improve Outcome: Progressing   Problem: Activity: Goal: Risk for activity intolerance will decrease Outcome: Progressing   Problem: Pain Managment: Goal: General experience of comfort will improve Outcome: Progressing   Problem: Safety: Goal: Ability to remain free from injury will improve Outcome: Progressing   

## 2021-03-20 NOTE — Progress Notes (Signed)
[name], [place] [id number]   CLINIC:  Medical Oncology/Hematology  PCP:  [name], DO No address on file None   REASON FOR VISIT:  Follow-up for abnormal SPEP and microcytic anemia  PRIOR THERAPY: None  CURRENT THERAPY: Ferrous sulfate tablet, daily  INTERVAL HISTORY:  [name] 65 y.o. female returns for routine follow-up of her abnormal SPEP and microcytic anemia.  She was last seen by Dr. Delton Coombes on 06/12/2020.  At today's visit, she reports feeling fair.  She is currently recovering from recent surgery on 03/11/2021, in which she underwent partial left nephrectomy for testing of left renal mass, which was found to be benign.  Regarding her anemia, she reports that she had been taking iron tablet at home daily for the past 6 months, but that this was held in light of her surgery on 03/11/2021.  She is waiting to see her surgeon later this week to see if it can be restarted.  She did have an episode of scant hematuria last week, but this has resolved.  No other major sources of bleeding such as hematemesis, hematochezia, melena, or epistaxis.  She reports some fatigue today, with energy about 50%, which she attributes to her recent surgery.  She denies any chest pain, dyspnea, dizziness, syncope.  Regarding her history of abnormal SPEP, she denies any new bone pain or recent fractures.  No B symptoms such as fever, chills, night sweats, unintentional weight loss.  She has not noted any new neurologic symptoms.  Denies any new neuropathy.  No new masses or lymphadenopathy per her report.  She is continuing to follow with the transplant nephrology team at [facility] in the hopes of receiving kidney transplantation.  She has 50% energy and 50% appetite. She endorses that she is maintaining a stable weight.   REVIEW OF SYSTEMS:  Review of Systems  Constitutional:  Positive for fatigue (50%). Negative for appetite change,  chills, diaphoresis, fever and unexpected weight change.  HENT:   Negative for lump/mass and nosebleeds.   Eyes:  Negative for eye problems.  Respiratory:  Positive for shortness of breath. Negative for cough and hemoptysis.   Cardiovascular:  Negative for chest pain, leg swelling and palpitations.  Gastrointestinal:  Positive for abdominal pain (at incision site), constipation, nausea and vomiting. Negative for blood in stool and diarrhea.  Genitourinary:  Negative for hematuria.   Skin: Negative.   Neurological:  Negative for dizziness, headaches and light-headedness.  Hematological:  Does not bruise/bleed easily.     PAST MEDICAL/SURGICAL HISTORY:  Past Medical History:  Diagnosis Date   Arthritis    Back pain    Breast pain    Chronic kidney disease    Complication of anesthesia    woke up during colonoscopy   Depression    GERD (gastroesophageal reflux disease)    Hyperlipidemia    Hypertension    Neck pain    PONV (postoperative nausea and vomiting)    PVCs (premature ventricular contractions)    occasional PVCs on event monitor 12/2020   Systemic lupus erythematosus ([place])    Past Surgical History:  Procedure Laterality Date   ABDOMINAL HYSTERECTOMY     APPENDECTOMY     arm surgery Left    CERVICAL SPINE SURGERY     ROBOTIC ASSITED PARTIAL NEPHRECTOMY Left 03/11/2021   Procedure: XI ROBOTIC ASSITED PARTIAL NEPHRECTOMY;  Surgeon: Ceasar Mons, MD;  Location: [facility];  Service: Urology;  Laterality: Left;  SOCIAL HISTORY:  Social History   Socioeconomic History   Marital status: Divorced    Spouse name: Not on file   Number of children: Not on file   Years of education: Not on file   Highest education level: Not on file  Occupational History   Occupation: Disability  Tobacco Use   Smoking status: Former    Years: 20.00    Types: Cigarettes    Quit date: 01/12/2021    Years since quitting: 0.1   Smokeless tobacco: Never   Tobacco comments:     1 cig some days  Vaping Use   Vaping Use: Never used  Substance and Sexual Activity   Alcohol use: No   Drug use: No   Sexual activity: Not on file  Other Topics Concern   Not on file  Social History Narrative   Not on file   Social Determinants of Health   Financial Resource Strain: Medium Risk   Difficulty of Paying Living Expenses: Somewhat hard  Food Insecurity: No Food Insecurity   Worried About Charity fundraiser in the Last Year: Never true   Ran Out of Food in the Last Year: Never true  Transportation Needs: Unmet Transportation Needs   Lack of Transportation (Medical): Yes   Lack of Transportation (Non-Medical): Yes  Physical Activity: Insufficiently Active   Days of Exercise per Week: 2 days   Minutes of Exercise per Session: 30 min  Stress: Stress Concern Present   Feeling of Stress : Rather much  Social Connections: Moderately Integrated   Frequency of Communication with Friends and Family: More than three times a week   Frequency of Social Gatherings with Friends and Family: Three times a week   Attends Religious Services: More than 4 times per year   Active Member of Clubs or Organizations: Yes   Attends Archivist Meetings: 1 to 4 times per year   Marital Status: Separated  Intimate Partner Violence: Not At Risk   Fear of Current or Ex-Partner: No   Emotionally Abused: No   Physically Abused: No   Sexually Abused: No    FAMILY HISTORY:  Family History  Problem Relation Age of Onset   Kidney failure Mother    Heart attack Father 31   Heart attack Sister 75    CURRENT MEDICATIONS:  Outpatient Encounter Medications as of 03/23/2021  Medication Sig   ALPRAZolam (XANAX) 0.5 MG tablet Take 0.5 mg by mouth 2 (two) times daily.   amLODipine (NORVASC) 10 MG tablet Take 10 mg by mouth daily.   ARIPiprazole (ABILIFY) 2 MG tablet Take 2 mg by mouth daily as needed for anxiety.   atorvastatin (LIPITOR) 20 MG tablet Take 20 mg by mouth at bedtime.    carvedilol (COREG) 6.25 MG tablet TAKE 1 TABLET(6.25 MG) BY MOUTH TWICE DAILY   docusate sodium (COLACE) 100 MG capsule Take 1 capsule (100 mg total) by mouth 2 (two) times daily.   doxazosin (CARDURA) 2 MG tablet Take 2 mg by mouth daily with supper.   ferrous sulfate 325 (65 FE) MG tablet Take 325 mg by mouth daily with breakfast.   furosemide (LASIX) 40 MG tablet Take 40 mg by mouth every Monday, Wednesday, and Friday.   gabapentin (NEURONTIN) 300 MG capsule Take 300 mg by mouth 3 (three) times daily.   hydrALAZINE (APRESOLINE) 100 MG tablet Take 100 mg by mouth 3 (three) times daily.   LINZESS 145 MCG CAPS capsule Take 145 mcg by mouth daily as  needed for constipation.   Menthol-Methyl Salicylate (BEN GAY GREASELESS) 10-15 % greaseless cream Apply 1 application topically 3 (three) times daily as needed for pain.   NARCAN 4 MG/0.1ML LIQD nasal spray kit Place 1 spray into the nose as needed. Overdose   ondansetron (ZOFRAN-ODT) 4 MG disintegrating tablet Take 4 mg by mouth every 8 (eight) hours as needed for nausea or vomiting.   oxyCODONE-acetaminophen (PERCOCET) 10-325 MG tablet Take 1 tablet by mouth every 4 (four) hours as needed for pain.   polyvinyl alcohol (LIQUIFILM TEARS) 1.4 % ophthalmic solution Place 1 drop into both eyes as needed for dry eyes.   promethazine (PHENERGAN) 12.5 MG tablet Take 1 tablet (12.5 mg total) by mouth every 4 (four) hours as needed for nausea or vomiting.   sodium bicarbonate 650 MG tablet Take 650 mg by mouth 2 (two) times daily.   zolpidem (AMBIEN) 5 MG tablet Take 5 mg by mouth at bedtime as needed for sleep.   No facility-administered encounter medications on file as of 03/23/2021.    ALLERGIES:  Allergies  Allergen Reactions   Ivp Dye [Iodinated Diagnostic Agents] Nausea And Vomiting   Iodine Other (See Comments)    Pt unsure of reaction    Penicillins Other (See Comments)    Passed out after penicillin injection at 65 years old. Has tolerated as  an adult.   Sulfa Antibiotics Other (See Comments)    Unknown allergic reaction     PHYSICAL EXAM:  ECOG PERFORMANCE STATUS: 2 - Symptomatic, <50% confined to bed  There were no vitals filed for this visit. There were no vitals filed for this visit. Physical Exam Constitutional:      Appearance: Normal appearance.     Comments: Somewhat weak-appearing  HENT:     Head: Normocephalic and atraumatic.     Mouth/Throat:     Mouth: Mucous membranes are moist.  Eyes:     Extraocular Movements: Extraocular movements intact.     Pupils: Pupils are equal, round, and reactive to light.  Cardiovascular:     Rate and Rhythm: Normal rate and regular rhythm.     Pulses: Normal pulses.     Heart sounds: Normal heart sounds.  Pulmonary:     Effort: Pulmonary effort is normal.     Breath sounds: Normal breath sounds.  Abdominal:     General: Bowel sounds are normal.     Palpations: Abdomen is soft.     Tenderness: There is no abdominal tenderness.  Musculoskeletal:        General: No swelling.     Right lower leg: No edema.     Left lower leg: No edema.  Lymphadenopathy:     Cervical: No cervical adenopathy.  Skin:    General: Skin is warm and dry.  Neurological:     General: No focal deficit present.     Mental Status: She is alert and oriented to person, place, and time.  Psychiatric:        Mood and Affect: Mood normal.        Behavior: Behavior normal.     LABORATORY DATA:  I have reviewed the labs as listed.  CBC    Component Value Date/Time   WBC 12.2 (H) 03/13/2021 0455   RBC 4.50 03/13/2021 0455   HGB 9.9 (L) 03/13/2021 0455   HCT 31.3 (L) 03/13/2021 0455   PLT 201 03/13/2021 0455   MCV 69.6 (L) 03/13/2021 0455   MCH 22.0 (L) 03/13/2021 0455  MCHC 31.6 03/13/2021 0455   RDW 16.2 (H) 03/13/2021 0455   LYMPHSABS 2.8 05/07/2020 1623   MONOABS 0.8 05/07/2020 1623   EOSABS 0.1 05/07/2020 1623   BASOSABS 0.1 05/07/2020 1623   CMP Latest Ref Rng & Units 03/13/2021  03/12/2021 03/09/2021  Glucose 70 - 99 mg/dL 109(H) 114(H) 131(H)  BUN 8 - 23 mg/dL 43(H) 42(H) 53(H)  Creatinine 0.44 - 1.00 mg/dL 2.69(H) 2.53(H) 2.60(H)  Sodium 135 - 145 mmol/L 141 138 144  Potassium 3.5 - 5.1 mmol/L 4.9 4.2 4.6  Chloride 98 - 111 mmol/L 110 107 109  CO2 22 - 32 mmol/L 22 25 26   Calcium 8.9 - 10.3 mg/dL 9.3 8.8(L) 9.6  Total Protein 6.5 - 8.1 g/dL - - 7.9  Total Bilirubin 0.3 - 1.2 mg/dL - - 0.6  Alkaline Phos 38 - 126 U/L - - 62  AST 15 - 41 U/L - - 20  ALT 0 - 44 U/L - - 13    DIAGNOSTIC IMAGING:  I have independently reviewed the relevant imaging and discussed with the patient.   ASSESSMENT & PLAN: 1.  Abnormal SPEP: - Patient seen at the request of Dr. Estanislado Pandy for abnormal SPEP. - SPEP on 03/25/2020 showed poorly defined band of restricted protein mobility in the gamma globulins.  Calcium was 10.2.  Hemoglobin was 11.9. - Repeat MGUS labs in November 2021 showed normal immunofixation and negative SPEP.  Elevated free light chain ratio 2.82 in the setting of CKD stage IV. - Most recent BMP (01/13/2021) with calcium 9.1 and creatinine 2.05 (at baseline in keeping with CKD stage IV).  Most recent CBC (02/24/2021) with normal Hgb 12.0. - Most recent SPEP (02/24/2021) was negative for M spike or any other significant abnormality. - Denies any new onset bone pains  - No current concern for evolving plasma cell disorder at this time.  Abnormalities in light chains are expected in the setting of CKD stage IV.   - PLAN:  24-hour urine study is pending.  We will repeat SPEP, light chains, and immunofixation in 1 year (September 2023).  2.  Microcytic anemia: - Most recent CBC (02/24/2021) shows Hgb 12.0 with MCV 68 and RBC 5.53 - Iron panel (02/24/2021) with ferritin 93, iron saturation 24% - Taking iron tablet daily with stool softener (temporarily held due to surgery on 03/03/2021) - Denies any signs of overt blood loss such as bright red blood per rectum or melena  -  Suspect multifactorial anemia in the setting of CKD stage IV, high burden of chronic disease, and relative iron deficiency  - She may also have some underlying thalassemia trait, considering that her labs reflect low MCV (68.0), normal Hgb (12.0), and elevated RBC (5.53) - PLAN: No indication for IV iron at this time.  Continue daily iron tablet.  Repeat labs and RTC in 6 months.  3.  CKD, stage IV: - She is evaluated by [facility]. Royce [facility]adam[facility] at [facility]. - Ultrasound kidneys on 04/15/2020 with increased echogenicity within the renal parenchyma compatible with medical renal disease.  2.1 cm mildly complex cystic lesion at the interpolar left kidney, indeterminate. - She has been referred for evaluation of possible kidney transplant at [place]: Continue follow-up with nephrology as well as transplant team.  Per patient request, letter will be sent to transplant team to attest that from our standpoint, there is no current concern for active cancer in this patient.  4.  Social/family history: - She worked at [employer] in  [place], has been on disability since 2002 for left-sided back pain. - She follows with rheumatology for positive ANA - She smokes about 1 to 2 cigarettes/day.  Had smoked 2 packs/month at [place]. - Her mother and daughter had lupus.  Maternal aunt had "stomach cancer".   PLAN SUMMARY & DISPOSITION: - Labs (CBC, CMP, iron panel) and RTC in 6 months  All questions were answered. The patient knows to call the clinic with any problems, questions or concerns.  Medical decision making: Moderate  Time spent on visit: I spent 30 minutes counseling the patient face to face. The total time spent in the appointment was 40 minutes and more than 50% was on counseling.   Harriett Rush, PA-C  03/23/2021 5:14 PM

## 2021-03-23 ENCOUNTER — Inpatient Hospital Stay (HOSPITAL_COMMUNITY): Payer: 59 | Attending: Physician Assistant | Admitting: Physician Assistant

## 2021-03-23 ENCOUNTER — Other Ambulatory Visit (HOSPITAL_COMMUNITY): Payer: Self-pay | Admitting: *Deleted

## 2021-03-23 VITALS — BP 145/66 | HR 60 | Temp 97.1°F | Resp 17 | Wt 149.6 lb

## 2021-03-23 DIAGNOSIS — D509 Iron deficiency anemia, unspecified: Secondary | ICD-10-CM | POA: Insufficient documentation

## 2021-03-23 DIAGNOSIS — N184 Chronic kidney disease, stage 4 (severe): Secondary | ICD-10-CM

## 2021-03-23 DIAGNOSIS — I129 Hypertensive chronic kidney disease with stage 1 through stage 4 chronic kidney disease, or unspecified chronic kidney disease: Secondary | ICD-10-CM | POA: Diagnosis not present

## 2021-03-23 DIAGNOSIS — D472 Monoclonal gammopathy: Secondary | ICD-10-CM

## 2021-03-23 DIAGNOSIS — R778 Other specified abnormalities of plasma proteins: Secondary | ICD-10-CM | POA: Diagnosis not present

## 2021-03-23 DIAGNOSIS — D631 Anemia in chronic kidney disease: Secondary | ICD-10-CM | POA: Diagnosis not present

## 2021-03-23 NOTE — Patient Instructions (Signed)
Patricia Dominguez at [facility] Discharge Instructions  You were seen today by Tarri Abernethy PA-C for your anemia and abnormal protein.  As we discussed, these are both related to your chronic kidney disease.  Your labs are within their target range.  You did not require any additional treatment for your anemia or abnormal protein at this time.  There are no signs of cancer in your system at this time.  We will continue to monitor these labs with return visit in about 6 months.  LABS: Return in 6 months for repeat labs  OTHER TESTS: No other tests at this time  MEDICATIONS: No changes to home medications  FOLLOW-UP APPOINTMENT: Office visit in 6 months, after labs   Thank you for choosing [place] at [facility] to provide your oncology and hematology care.  To afford each patient quality time with our provider, please arrive at least 15 minutes before your scheduled appointment time.   If you have a lab appointment with the [facility] please come in thru the Main Entrance and check in at the main information desk.  You need to re-schedule your appointment should you arrive 10 or more minutes late.  We strive to give you quality time with our providers, and arriving late affects you and other patients whose appointments are after yours.  Also, if you no show three or more times for appointments you may be dismissed from the clinic at the providers discretion.     Again, thank you for choosing [facility].  Our hope is that these requests will decrease the amount of time that you wait before being seen by our physicians.       _____________________________________________________________  Should you have questions after your visit to [facility], please contact our office at [phone] and follow the prompts.  Our office hours are 8:00 a.m. and 4:30 p.m. Monday - Friday.  Please note that voicemails left after 4:00  p.m. may not be returned until the following business day.  We are closed weekends and major holidays.  You do have access to a nurse 24-7, just call the main number to the clinic [phone] and do not press any options, hold on the line and a nurse will answer the phone.    For prescription refill requests, have your pharmacy contact our office and allow 72 hours.    Due to Covid, you will need to wear a mask upon entering the hospital. If you do not have a mask, a mask will be given to you at the Main Entrance upon arrival. For doctor visits, patients may have 1 support person age 41 or older with them. For treatment visits, patients can not have anyone with them due to social distancing guidelines and our immunocompromised population.

## 2021-03-26 LAB — UIFE/LIGHT CHAINS/TP QN, 24-HR UR
FR KAPPA LT CH,24HR: 37.6 mg/24 hr
FR LAMBDA LT CH,24HR: 4.44 mg/24 hr
Free Kappa Lt Chains,Ur: 34.18 mg/L (ref 1.17–86.46)
Free Kappa/Lambda Ratio: 8.46 (ref 1.83–14.26)
Free Lambda Lt Chains,Ur: 4.04 mg/L (ref 0.27–15.21)
Total Protein, Urine-Ur/day: 372 mg/24 hr — ABNORMAL HIGH (ref 30–150)
Total Protein, Urine: 33.8 mg/dL
Total Volume: 1100

## 2021-04-03 ENCOUNTER — Other Ambulatory Visit: Payer: Self-pay | Admitting: Urology

## 2021-04-03 DIAGNOSIS — N281 Cyst of kidney, acquired: Secondary | ICD-10-CM

## 2021-04-29 ENCOUNTER — Ambulatory Visit
Admission: RE | Admit: 2021-04-29 | Discharge: 2021-04-29 | Disposition: A | Discharge status: Home / Self Care | Payer: 59 | Source: Ambulatory Visit | Attending: Urology | Admitting: Urology

## 2021-04-29 DIAGNOSIS — N281 Cyst of kidney, acquired: Secondary | ICD-10-CM

## 2021-04-29 MED ORDER — GADOBENATE DIMEGLUMINE 529 MG/ML IV SOLN
12.0000 mL | Freq: Once | INTRAVENOUS | Status: AC | PRN
  Administered 2021-04-29: 12 mL via INTRAVENOUS

## 2021-06-05 NOTE — Progress Notes (Signed)
Patricia Dominguez D.[place] Phone: [phone]   Assessment and Plan:     1. Chronic back pain, unspecified back location, unspecified back pain laterality  2. History of fusion of cervical spine 3.  Chronic pain syndrome - Multiple chronic issues, initial sports medicine visit - Patient presents looking for new pain management physician as her previous family medicine physician in [place] is no longer prescribing controlled substances - Informed patient that her chronic pain management would be best managed by pain management.  Referral to pain management placed - I will refill her chronic medications for a 2-week timeframe until she can get established.  Patient may call back with her appointment time and I am willing to fill medications until she can establish care with pain management - Patient reviewed on PMP aware showing timely refills from primarily one provider in [place] - Refilled gabapentin 300 mg 3 times daily for 2 weeks - Refilled Percocet 10-3 25 every 4 hours as needed for 2 weeks - Patient recently refilled alprazolam 0.5 mg 2 times daily, so I did not refill this medication today -Patient has tried physical therapy and facet injections in the past and is not interested in trying these treatment modalities again.  She states that she has been well controlled on this prescription pain management schedule and wishes to continue it - Patient accompanied by her friend who helps provide HPI  Pertinent previous records reviewed include C-spine x-ray 02/2017, lumbar spine x-ray 02/2017, lab work including creatinine and GFR from 03/23/2021   Follow Up: As needed   Subjective:   I, Pincus Badder, am serving as a Education administrator for Doctor Glennon Mac  Chief Complaint: chronic back pain   HPI:   06/09/2021 Patient is a 65 year old female complaining of chronic back pain. Patient states that all of her  doctors are here in [place] and needs a new prescription for gabapentin and Percocet for her chronic pain.  Has kidney failure.  Was previously following with a family medicine physician in [place], but states that they were told last week that that physician would no longer be prescribing narcotics and that they should find a different doctor.   Relevant Historical Information: CKD stage IV.  Currently working with [facility] and is on kidney transplant list  Additional pertinent review of systems negative.   Current Outpatient Medications:    ALPRAZolam (XANAX) 0.5 MG tablet, Take 0.5 mg by mouth 2 (two) times daily., Disp: , Rfl:    amLODipine (NORVASC) 10 MG tablet, Take 10 mg by mouth daily., Disp: , Rfl:    ARIPiprazole (ABILIFY) 2 MG tablet, Take 2 mg by mouth daily as needed for anxiety., Disp: , Rfl:    atorvastatin (LIPITOR) 20 MG tablet, Take 20 mg by mouth at bedtime., Disp: , Rfl:    carvedilol (COREG) 6.25 MG tablet, TAKE 1 TABLET(6.25 MG) BY MOUTH TWICE DAILY, Disp: 180 tablet, Rfl: 2   docusate sodium (COLACE) 100 MG capsule, Take 1 capsule (100 mg total) by mouth 2 (two) times daily., Disp: , Rfl:    doxazosin (CARDURA) 2 MG tablet, Take 2 mg by mouth daily with supper., Disp: , Rfl:    ferrous sulfate 325 (65 FE) MG tablet, Take 325 mg by mouth daily with breakfast., Disp: , Rfl:    furosemide (LASIX) 40 MG tablet, Take 40 mg by mouth every Monday, Wednesday, and Friday., Disp: , Rfl:  gabapentin (NEURONTIN) 300 MG capsule, Take 1 capsule (300 mg total) by mouth 3 (three) times daily for 14 days., Disp: 42 capsule, Rfl: 0   hydrALAZINE (APRESOLINE) 100 MG tablet, Take 100 mg by mouth 3 (three) times daily., Disp: , Rfl:    LINZESS 145 MCG CAPS capsule, Take 145 mcg by mouth daily as needed for constipation., Disp: , Rfl:    Menthol-Methyl Salicylate (BEN GAY GREASELESS) 10-15 % greaseless cream, Apply 1 application topically 3 (three) times daily as needed for pain.,  Disp: , Rfl:    NARCAN 4 MG/0.1ML LIQD nasal spray kit, Place 1 spray into the nose as needed. Overdose, Disp: , Rfl:    ondansetron (ZOFRAN-ODT) 4 MG disintegrating tablet, Take 4 mg by mouth every 8 (eight) hours as needed for nausea or vomiting. (Patient not taking: Reported on 03/23/2021), Disp: , Rfl:    oxyCODONE-acetaminophen (PERCOCET) 10-325 MG tablet, Take 1 tablet by mouth every 4 (four) hours as needed for up to 14 days for pain., Disp: 52 tablet, Rfl: 0   polyvinyl alcohol (LIQUIFILM TEARS) 1.4 % ophthalmic solution, Place 1 drop into both eyes as needed for dry eyes., Disp: , Rfl:    promethazine (PHENERGAN) 12.5 MG tablet, Take 1 tablet (12.5 mg total) by mouth every 4 (four) hours as needed for nausea or vomiting. (Patient not taking: Reported on 03/23/2021), Disp: 15 tablet, Rfl: 0   sodium bicarbonate 650 MG tablet, Take 650 mg by mouth 2 (two) times daily., Disp: , Rfl:    zolpidem (AMBIEN) 5 MG tablet, Take 5 mg by mouth at bedtime as needed for sleep., Disp: , Rfl:    Objective:     Vitals:   06/09/21 1441  BP: 140/90  Pulse: 70  SpO2: 97%  Weight: 148 lb (67.1 kg)  Height: 5' 7"  (1.702 m)      Body mass index is 23.18 kg/m.    Physical Exam:    General: Well-appearing, cooperative, sitting comfortably in no acute distress.  HEENT: Normocephalic, atraumatic.   Neck: No gross abnormality.  Cardiovascular: No pallor or cyanosis. Resp: Comfortable WOB.   Abdomen: Non distended.   Skin: Warm and dry; no focal rashes identified on limited exam. Extremities: No cyanosis or edema.  Neuro: Gross motor and sensory intact. Gait normal. Psychiatric: Mood and affect are appropriate.   Neuro: CN II-XII intact; strength and sensation intact generally, reflexes 2+ in upper and lower extremities     Electronically signed by:  Patricia Dominguez D.[facility] 3:12 PM 06/09/21

## 2021-06-09 ENCOUNTER — Ambulatory Visit (INDEPENDENT_AMBULATORY_CARE_PROVIDER_SITE_OTHER): Payer: 59 | Admitting: Sports Medicine

## 2021-06-09 ENCOUNTER — Ambulatory Visit: Payer: 59 | Admitting: Family Medicine

## 2021-06-09 ENCOUNTER — Other Ambulatory Visit: Payer: Self-pay

## 2021-06-09 VITALS — BP 140/90 | HR 70 | Ht 67.0 in | Wt 148.0 lb

## 2021-06-09 DIAGNOSIS — M549 Dorsalgia, unspecified: Secondary | ICD-10-CM

## 2021-06-09 DIAGNOSIS — G8929 Other chronic pain: Secondary | ICD-10-CM | POA: Diagnosis not present

## 2021-06-09 DIAGNOSIS — Z981 Arthrodesis status: Secondary | ICD-10-CM | POA: Diagnosis not present

## 2021-06-09 DIAGNOSIS — G894 Chronic pain syndrome: Secondary | ICD-10-CM | POA: Diagnosis not present

## 2021-06-09 MED ORDER — OXYCODONE-ACETAMINOPHEN 10-325 MG PO TABS: 1.0000 | ORAL_TABLET | ORAL | 0 refills | Status: DC | PRN

## 2021-06-09 MED ORDER — GABAPENTIN 300 MG PO CAPS: 300.0000 mg | ORAL_CAPSULE | Freq: Three times a day (TID) | ORAL | 0 refills | Status: DC

## 2021-06-09 NOTE — Patient Instructions (Addendum)
Good to see you  Referral for pain management [facility] [phone] As needed follow up

## 2021-06-21 ENCOUNTER — Other Ambulatory Visit: Payer: Self-pay | Admitting: Sports Medicine

## 2021-06-21 DIAGNOSIS — M549 Dorsalgia, unspecified: Secondary | ICD-10-CM

## 2021-06-21 DIAGNOSIS — G8929 Other chronic pain: Secondary | ICD-10-CM

## 2021-06-21 DIAGNOSIS — Z981 Arthrodesis status: Secondary | ICD-10-CM

## 2021-06-21 DIAGNOSIS — G894 Chronic pain syndrome: Secondary | ICD-10-CM

## 2021-06-22 ENCOUNTER — Other Ambulatory Visit: Payer: Self-pay | Admitting: Sports Medicine

## 2021-06-22 ENCOUNTER — Telehealth: Payer: Self-pay | Admitting: Sports Medicine

## 2021-06-22 DIAGNOSIS — G894 Chronic pain syndrome: Secondary | ICD-10-CM

## 2021-06-22 DIAGNOSIS — G8929 Other chronic pain: Secondary | ICD-10-CM

## 2021-06-22 DIAGNOSIS — M549 Dorsalgia, unspecified: Secondary | ICD-10-CM

## 2021-06-22 DIAGNOSIS — Z981 Arthrodesis status: Secondary | ICD-10-CM

## 2021-06-22 MED ORDER — OXYCODONE-ACETAMINOPHEN 10-325 MG PO TABS: 1.0000 | ORAL_TABLET | ORAL | 0 refills | Status: DC | PRN

## 2021-06-22 NOTE — Progress Notes (Signed)
I will send in 1 additional 2-week refill of Percocet 10-325 mg to be taken every 4 hours as needed for pain control.  Patient's last prescription was sent to complete on 06/23/2021, so I will set fill date for 06/23/2021.  Patient should be establishing with pain management for chronic prescriptions moving forward.

## 2021-06-22 NOTE — Telephone Encounter (Signed)
Pt was messaged and told about new Rx and asked when her next appointment is

## 2021-06-22 NOTE — Telephone Encounter (Signed)
Patient called asking if Dr Glennon Mac would be able to refill the prescription for oxyCODONE-acetaminophen (PERCOCET) 10-325 MG tablet.

## 2021-06-25 NOTE — Addendum Note (Signed)
Addended by: Judy Pimple R on: 06/25/2021 11:28 AM   Modules accepted: Orders

## 2021-07-04 ENCOUNTER — Other Ambulatory Visit: Payer: Self-pay | Admitting: Sports Medicine

## 2021-07-04 DIAGNOSIS — Z981 Arthrodesis status: Secondary | ICD-10-CM

## 2021-07-04 DIAGNOSIS — G894 Chronic pain syndrome: Secondary | ICD-10-CM

## 2021-07-04 DIAGNOSIS — G8929 Other chronic pain: Secondary | ICD-10-CM

## 2021-07-06 ENCOUNTER — Telehealth: Payer: Self-pay | Admitting: Sports Medicine

## 2021-07-06 ENCOUNTER — Other Ambulatory Visit: Payer: Self-pay | Admitting: Sports Medicine

## 2021-07-06 DIAGNOSIS — M549 Dorsalgia, unspecified: Secondary | ICD-10-CM

## 2021-07-06 DIAGNOSIS — F418 Other specified anxiety disorders: Secondary | ICD-10-CM

## 2021-07-06 DIAGNOSIS — G8929 Other chronic pain: Secondary | ICD-10-CM

## 2021-07-06 DIAGNOSIS — Z981 Arthrodesis status: Secondary | ICD-10-CM

## 2021-07-06 DIAGNOSIS — G894 Chronic pain syndrome: Secondary | ICD-10-CM

## 2021-07-06 MED ORDER — OXYCODONE-ACETAMINOPHEN 10-325 MG PO TABS: 1.0000 | ORAL_TABLET | Freq: Four times a day (QID) | ORAL | 0 refills | Status: DC | PRN

## 2021-07-06 MED ORDER — ALPRAZOLAM 0.5 MG PO TABS: 0.5000 mg | ORAL_TABLET | Freq: Two times a day (BID) | ORAL | 0 refills | Status: DC

## 2021-07-06 NOTE — Telephone Encounter (Signed)
Pt was messaged

## 2021-07-06 NOTE — Progress Notes (Signed)
Patient is currently in the process of establishing care with new pain management provider.  The soonest available appointment was 08/17/2021.  Patient has been well controlled on pain medications for 20+ years until her provider in [place] stated that they would no longer be providing these medications for her per patient.  I will refill pain medications at the level of patient's chronic use until she can establish care.  PMP aware checked for [place], [place], [place].  I am the only provider that is prescribed narcotic medications for patient in the past 2 months.  Will refill Xanax and Percocet x1 month.

## 2021-07-06 NOTE — Telephone Encounter (Signed)
Patient called requesting a refill on her Oxycodone.  She also asked if Dr Glennon Mac would be able to fill her Xanax 0.5mg .  She is scheduled the [facility] but their first available was March 6th.  Please advise.

## 2021-07-17 ENCOUNTER — Other Ambulatory Visit: Payer: Self-pay | Admitting: Sports Medicine

## 2021-07-17 DIAGNOSIS — Z981 Arthrodesis status: Secondary | ICD-10-CM

## 2021-07-17 DIAGNOSIS — G8929 Other chronic pain: Secondary | ICD-10-CM

## 2021-07-17 DIAGNOSIS — G894 Chronic pain syndrome: Secondary | ICD-10-CM

## 2021-07-31 ENCOUNTER — Other Ambulatory Visit: Payer: Self-pay | Admitting: Sports Medicine

## 2021-07-31 DIAGNOSIS — Z981 Arthrodesis status: Secondary | ICD-10-CM

## 2021-07-31 DIAGNOSIS — G8929 Other chronic pain: Secondary | ICD-10-CM

## 2021-07-31 DIAGNOSIS — F418 Other specified anxiety disorders: Secondary | ICD-10-CM

## 2021-07-31 DIAGNOSIS — G894 Chronic pain syndrome: Secondary | ICD-10-CM

## 2021-07-31 MED ORDER — OXYCODONE-ACETAMINOPHEN 10-325 MG PO TABS: 1.0000 | ORAL_TABLET | Freq: Four times a day (QID) | ORAL | 0 refills | Status: AC | PRN

## 2021-07-31 MED ORDER — ALPRAZOLAM 0.5 MG PO TABS: 0.5000 mg | ORAL_TABLET | Freq: Two times a day (BID) | ORAL | 0 refills | Status: AC

## 2021-07-31 NOTE — Telephone Encounter (Addendum)
Patient called asking if Dr Glennon Mac would be able to fill both medications again until she sees the pain medicine doctor on March 6th. She will be out of them on Wednesday, Feb 22nd.  She said that she is supposed to be going out of town to stay with her daughter and will run out while she is gone. If possible, could it be sent now for her to try to fill before she leaves?

## 2021-07-31 NOTE — Progress Notes (Signed)
Called and spoke with patient.  Patient was planning on leaving town to go to [place] in [place] today and was asking for early refill of medications.  I informed patient that we cannot fill controlled substances early and that the earliest I could fill these prescriptions would be on 08/06/2021.  I also informed patient that I cannot prescribe controlled substances across state lines.  Patient verbalized understanding and said that she would delay her trip until after refill could be made.  Requested refill be sent to pharmacy to be filled on 08/06/2021 to get her through until her appointment with pain management on 08/17/2021.  Refills have been provided.

## 2021-08-03 ENCOUNTER — Telehealth: Payer: Self-pay | Admitting: Rheumatology

## 2021-08-03 NOTE — Telephone Encounter (Signed)
Patient's caretaker, Marye Round, left a voicemail stating the patient needed clearance from Dr. Estanislado Pandy to get a kidney transplant. Patricia Dominguez states she wants to know if she needs to bring Patricia Dominguez in for an appointment. [phone]

## 2021-08-04 NOTE — Telephone Encounter (Signed)
Left message to advise Patricia Dominguez as we have not seen patient in the office since November 2021 and we do not have patient on any medications so we are unable to provide clearance for that surgery. Advised her to contact the office if she has any questions or concerns.

## 2021-08-05 ENCOUNTER — Other Ambulatory Visit: Payer: Self-pay | Admitting: Sports Medicine

## 2021-08-05 DIAGNOSIS — G8929 Other chronic pain: Secondary | ICD-10-CM

## 2021-08-05 DIAGNOSIS — Z981 Arthrodesis status: Secondary | ICD-10-CM

## 2021-08-05 DIAGNOSIS — G894 Chronic pain syndrome: Secondary | ICD-10-CM

## 2021-08-17 ENCOUNTER — Other Ambulatory Visit: Payer: Self-pay | Admitting: Sports Medicine

## 2021-08-17 DIAGNOSIS — G894 Chronic pain syndrome: Secondary | ICD-10-CM

## 2021-08-17 DIAGNOSIS — Z981 Arthrodesis status: Secondary | ICD-10-CM

## 2021-08-17 DIAGNOSIS — G8929 Other chronic pain: Secondary | ICD-10-CM

## 2021-08-20 ENCOUNTER — Encounter: Payer: Self-pay | Admitting: Rheumatology

## 2021-08-20 ENCOUNTER — Other Ambulatory Visit: Payer: Self-pay

## 2021-08-20 ENCOUNTER — Ambulatory Visit (INDEPENDENT_AMBULATORY_CARE_PROVIDER_SITE_OTHER): Payer: 59 | Admitting: Rheumatology

## 2021-08-20 VITALS — BP 157/72 | HR 53 | Ht 67.5 in | Wt 143.8 lb

## 2021-08-20 DIAGNOSIS — F418 Other specified anxiety disorders: Secondary | ICD-10-CM

## 2021-08-20 DIAGNOSIS — N184 Chronic kidney disease, stage 4 (severe): Secondary | ICD-10-CM

## 2021-08-20 DIAGNOSIS — M5030 Other cervical disc degeneration, unspecified cervical region: Secondary | ICD-10-CM | POA: Diagnosis not present

## 2021-08-20 DIAGNOSIS — R778 Other specified abnormalities of plasma proteins: Secondary | ICD-10-CM

## 2021-08-20 DIAGNOSIS — R768 Other specified abnormal immunological findings in serum: Secondary | ICD-10-CM | POA: Diagnosis not present

## 2021-08-20 DIAGNOSIS — M5136 Other intervertebral disc degeneration, lumbar region: Secondary | ICD-10-CM | POA: Diagnosis not present

## 2021-08-20 DIAGNOSIS — Z8679 Personal history of other diseases of the circulatory system: Secondary | ICD-10-CM

## 2021-08-20 DIAGNOSIS — E785 Hyperlipidemia, unspecified: Secondary | ICD-10-CM

## 2021-08-20 DIAGNOSIS — Z8639 Personal history of other endocrine, nutritional and metabolic disease: Secondary | ICD-10-CM

## 2021-08-20 DIAGNOSIS — E8889 Other specified metabolic disorders: Secondary | ICD-10-CM

## 2021-08-20 DIAGNOSIS — E049 Nontoxic goiter, unspecified: Secondary | ICD-10-CM

## 2021-08-20 NOTE — Progress Notes (Signed)
Office Visit Note  Patient: Patricia Dominguez             Date of Birth: Apr 07, 1956           MRN: [id number]             PCP: [name], DO Referring: [name], DO Visit Date: 08/20/2021 Occupation: '@GUAROCC'$ @  Subjective:  History of positive ANA  History of Present Illness: Patricia Dominguez is a 66 y.o. female returns today after her last visit in November 2021.  At that time she was evaluated for positive ANA and low GFR.  Her autoimmune labs were all negative except for positive ANA.  She had no clinical features of autoimmune disease.  She was evaluated by Dr. Royce Macadamia for chronic kidney disease which was felt to be secondary to chronic hypertension.  She had recent work-up by the oncologist and was told that she does not have renal cancer.  She is a scheduled to have renal transplant.  They wanted clearance from the rheumatologist.  She denies any history of fatigue, oral ulcers, malar rash, photosensitivity, Raynaud's phenomenon, lymphadenopathy or inflammatory arthritis.  Patient states that she applied cream on her face recently and developed a rash all over her face and her neck.  Activities of Daily Living:  Patient reports morning stiffness for a few minutes.   Patient Denies nocturnal pain.  Difficulty dressing/grooming: Reports Difficulty climbing stairs: Reports Difficulty getting out of chair: Reports Difficulty using hands for taps, buttons, cutlery, and/or writing: Reports  Review of Systems  Constitutional:  Negative for fatigue.  HENT:  Positive for nosebleeds and nose dryness. Negative for mouth sores and mouth dryness.   Eyes:  Negative for pain, itching and dryness.  Respiratory:  Negative for shortness of breath and difficulty breathing.   Cardiovascular:  Negative for chest pain and palpitations.  Gastrointestinal:  Positive for constipation. Negative for blood in stool and diarrhea.  Endocrine: Positive for increased urination.  Genitourinary:   Positive for nocturia. Negative for difficulty urinating.  Musculoskeletal:  Positive for morning stiffness. Negative for joint pain, joint pain, joint swelling, myalgias, muscle tenderness and myalgias.  Skin:  Negative for color change, rash, redness and sensitivity to sunlight.  Allergic/Immunologic: Negative for susceptible to infections.  Neurological:  Positive for numbness and headaches. Negative for dizziness and memory loss.  Hematological:  Negative for bruising/bleeding tendency and swollen glands.  Psychiatric/Behavioral:  Positive for depressed mood. Negative for confusion. The patient is nervous/anxious.    PMFS History:  Patient Active Problem List   Diagnosis Date Noted   Renal cyst, left 03/11/2021   PVCs (premature ventricular contractions) 12/16/2020   Palpitations 09/21/2018   Hyperlipidemia LDL goal <70 07/12/2017   Goiter 08/02/2016   Chest pain 08/01/2016   Chronic back pain 08/01/2016   Depression with anxiety 08/01/2016   Benign essential HTN 08/01/2016   Chronic kidney disease (CKD), stage IV (severe) ([place]) 08/01/2016    Past Medical History:  Diagnosis Date   Arthritis    Back pain    Breast pain    Chronic kidney disease    Complication of anesthesia    woke up during colonoscopy   Depression    GERD (gastroesophageal reflux disease)    Hyperlipidemia    Hypertension    Neck pain    PONV (postoperative nausea and vomiting)    PVCs (premature ventricular contractions)    occasional PVCs on event monitor 12/2020   Systemic lupus erythematosus ([place])  Family History  Problem Relation Age of Onset   Kidney failure Mother    Heart attack Father 16   Heart attack Sister 40   Past Surgical History:  Procedure Laterality Date   ABDOMINAL HYSTERECTOMY     APPENDECTOMY     arm surgery Left    CERVICAL SPINE SURGERY     ROBOTIC ASSITED PARTIAL NEPHRECTOMY Left 03/11/2021   Procedure: XI ROBOTIC ASSITED PARTIAL NEPHRECTOMY;  Surgeon: Ceasar Mons, MD;  Location: [facility];  Service: Urology;  Laterality: Left;   Social History   Social History Narrative   Not on file   Immunization History  Administered Date(s) Administered   Moderna Sars-Covid-2 Vaccination 08/26/2019, 09/23/2019, 05/20/2020     Objective: Vital Signs: BP (!) 157/72 (BP Location: Left Arm, Patient Position: Sitting, Cuff Size: Normal)    Pulse (!) 53    Ht 5' 7.5" (1.715 m)    Wt 143 lb 12.8 oz (65.2 kg)    BMI 22.19 kg/m    Physical Exam Vitals and nursing note reviewed.  Constitutional:      Appearance: She is well-developed.  HENT:     Head: Normocephalic and atraumatic.  Eyes:     Conjunctiva/sclera: Conjunctivae normal.  Cardiovascular:     Rate and Rhythm: Normal rate and regular rhythm.     Heart sounds: Normal heart sounds.  Pulmonary:     Effort: Pulmonary effort is normal.     Breath sounds: Normal breath sounds.  Abdominal:     General: Bowel sounds are normal.     Palpations: Abdomen is soft.  Musculoskeletal:     Cervical back: Normal range of motion.  Lymphadenopathy:     Cervical: No cervical adenopathy.  Skin:    General: Skin is warm and dry.     Capillary Refill: Capillary refill takes less than 2 seconds.     Comments: Mild erythema was noted over her entire face and her neck.  Neurological:     Mental Status: She is alert and oriented to person, place, and time.  Psychiatric:        Behavior: Behavior normal.     Musculoskeletal Exam: C-spine was in good range of motion.  Shoulder joints, elbow joints, wrist joints, MCPs were in good range of motion.  She had right third PIP contracture due to previous injury.  No synovitis was noted.  Hip joints and knee joints with good range of motion.  There was no tenderness over ankles or MTPs.  CDAI Exam: CDAI Score: -- Patient Global: --; Provider Global: -- Swollen: --; Tender: -- Joint Exam 08/20/2021   No joint exam has been documented for this visit    There is currently no information documented on the homunculus. Go to the Rheumatology activity and complete the homunculus joint exam.  Investigation: No additional findings.  Imaging: No results found.  Recent Labs: Lab Results  Component Value Date   WBC 12.2 (H) 03/13/2021   HGB 9.9 (L) 03/13/2021   PLT 201 03/13/2021   NA 141 03/13/2021   K 4.9 03/13/2021   CL 110 03/13/2021   CO2 22 03/13/2021   GLUCOSE 109 (H) 03/13/2021   BUN 43 (H) 03/13/2021   CREATININE 2.69 (H) 03/13/2021   BILITOT 0.6 03/09/2021   ALKPHOS 62 03/09/2021   AST 20 03/09/2021   ALT 13 03/09/2021   PROT 7.9 03/09/2021   ALBUMIN 3.7 03/13/2021   CALCIUM 9.3 03/13/2021   GFRAA 19 (L) 03/25/2020   QFTBGOLDPLUS  NEGATIVE 03/25/2020    Speciality Comments: No specialty comments available.  Procedures:  No procedures performed Allergies: Ivp dye [iodinated contrast media], Iodine, Penicillins, and Sulfa antibiotics   Assessment / Plan:     Visit Diagnoses: Positive ANA (antinuclear antibody) - ANA 1: 1280, ENA negative, C3-C4 normal: She denies any history of oral ulcers, nasal ulcers, fatigue, sicca symptoms, malar rash, photosensitivity, Raynaud's phenomenon, lymphadenopathy or inflammatory arthritis.  She developed a rash on her face due to the use of her facial cream.  She had recent labs on July 06, 2021 which showed anticardiolipin antibodies, beta-2 GP 1 and lupus anticoagulant were negative.  She had no clinical features of lupus today.  CKD (chronic kidney disease) stage 4, GFR 15-29 ml/min (HCC) - She was evaluated by Dr. Royce [facility] at [facility].  She was diagnosed with hypertensive renal disease.  She will be undergoing renal transplant and will be receiving kidney from her grandson.  DDD (degenerative disc disease), cervical-she denies any discomfort today.  DDD (degenerative disc disease), lumbar - Related to work-related injury in the past.  She denies any discomfort  today.  History of hypertension-her systolic blood pressure was a still elevated.  Other medical problems are listed as follows:  Hyperlipidemia LDL goal <70  History of type 2 diabetes mellitus  Depression with anxiety  TPMT intermediate metabolizer (HCC)  Abnormal SPEP - Abnormal SPEP on 03/25/20.  Immunofixation was normal.  Followed by Dr. Delton Coombes.  Goiter  Orders: No orders of the defined types were placed in this encounter.  No orders of the defined types were placed in this encounter.    Follow-Up Instructions: Return if symptoms worsen or fail to improve, for Osteoarthritis.   Bo Merino, MD  Note - This record has been created using Editor, commissioning.  Chart creation errors have been sought, but may not always  have been located. Such creation errors do not reflect on  the standard of medical care.

## 2021-08-21 ENCOUNTER — Telehealth: Payer: Self-pay | Admitting: Sports Medicine

## 2021-08-21 NOTE — Telephone Encounter (Signed)
Patient called asking if Dr Glennon Mac could send in another prescription for ALPRAZolam Patricia Dominguez) 0.5 MG tablet to [facility] on [address] in [place], [place]. ? ?She said that she has been very stressed and this would help her to be able to get some rest. ? ?Please advise. ? ?

## 2021-08-24 NOTE — Telephone Encounter (Signed)
Pt was called and she understood that Dr. Glennon Mac would not be able to prescribe for the following reasons in his note  ?

## 2021-09-25 NOTE — Progress Notes (Deleted)
NO SHOW

## 2021-09-28 ENCOUNTER — Ambulatory Visit (HOSPITAL_COMMUNITY): Payer: 59 | Admitting: Physician Assistant

## 2021-09-28 ENCOUNTER — Encounter (HOSPITAL_COMMUNITY): Payer: Self-pay

## 2021-09-28 ENCOUNTER — Inpatient Hospital Stay (HOSPITAL_COMMUNITY): Payer: 59

## 2021-11-21 ENCOUNTER — Other Ambulatory Visit: Payer: Self-pay | Admitting: Cardiology

## 2021-11-23 ENCOUNTER — Other Ambulatory Visit: Payer: Self-pay | Admitting: Cardiology

## 2021-12-18 ENCOUNTER — Other Ambulatory Visit: Payer: Self-pay | Admitting: Cardiology

## 2022-07-09 LAB — LAB REPORT - SCANNED: Creatinine, POC: 72.3 mg/dL

## 2022-09-17 ENCOUNTER — Encounter: Payer: Self-pay | Admitting: Cardiology

## 2022-09-17 ENCOUNTER — Ambulatory Visit: Payer: 59 | Attending: Physician Assistant | Admitting: Cardiology

## 2022-09-17 VITALS — BP 138/65 | HR 55 | Ht 67.5 in | Wt 138.0 lb

## 2022-09-17 DIAGNOSIS — R079 Chest pain, unspecified: Secondary | ICD-10-CM

## 2022-09-17 DIAGNOSIS — R002 Palpitations: Secondary | ICD-10-CM | POA: Diagnosis not present

## 2022-09-17 DIAGNOSIS — I10 Essential (primary) hypertension: Secondary | ICD-10-CM

## 2022-09-17 NOTE — Progress Notes (Signed)
Virtual Visit via Video Note   Because of Patricia Dominguez's co-morbid illnesses, she is at least at moderate risk for complications without adequate follow up.  This format is felt to be most appropriate for this patient at this time.  All issues noted in this document were discussed and addressed.  A limited physical exam was performed with this format.  Please refer to the patient's chart for her consent to telehealth for [facility].  Date:  09/17/2022   ID:  Patricia Dominguez, DOB 1955/09/02, MRN [id number] The patient was identified using 2 identifiers.  Patient Location: Home Provider Location: Home Office   PCP:  Chana Bode, DO   [facility] Providers Cardiologist:  Armanda Magic, MD     Evaluation Performed:  Follow-Up Visit  Chief Complaint:  Chest pain  History of Present Illness:    Patricia Dominguez is a 67 y.o. female with  a hx of atypical chest pain tender to palpation over her left chest wall that initially started in 2018. CRF included remote tobacco use, family history of CAD and MI at an early age in her father and hypertension.  2-D echo 07/2016 showed normal LVEF 60-65% with no wall motion abnormality. No stress test recommended as her CP was felt to be musculoskeletal.   Statin was stopped and beta blocker was stopped because of bradycardia. She saw Herma Carson, PA back a few months later and was still having some mild CP and BP was elevated.  Her Losartan was increased to 50mg  BID and was referred to pain management.  ETT was recommended but she never followed through.    She was seen back for virtual visit in April 2020 with complaints of recurrent chest pain.  The pain was different from what she had before in some respects but and others is the same.  There was some sharp shooting pains but then she also felt like something was squeezing her heart inside.  She would notice her heart racing at times and beating irregular.  She thhought she  may be having panic attacks because if she would sit down and rests and take Xanax her symptoms seem to improve.  Of concern though she was getting nauseated and sometimes vomited with the chest discomfort.  She denied any radiation of the pain or diaphoresis.  Her blood pressures had not been well controlled as well.  A Lexiscan myoview was ordered but patient never followed through.     She was seen back by me and was still having chest pain that was sporadic and she was very concerned something was wrong with her heart.  She cancelled her nuclear stress test several times due to concern over getting COVID.  She wore a Ziopatch a few months ago but we never got the results.  Nuclear stress test was normal with no ischemia.    She is here today for followup.  She had some chest pain a month ago and a few weeks ago.  She says that it lasted about 30 min to 1 hour.  She describes it as an aching sensation on the left side with no radiation.  She got so scared she cannot remember if she had nausea or diaphoresis.  She did not seek medical attention. She denies any  SOB, DOE, PND, orthopnea, LE edema, dizziness, palpitations or syncope. She is compliant with her meds and is tolerating meds with no SE.    Past Medical History:  Diagnosis Date  Arthritis    Back pain    Breast pain    Chronic kidney disease    Complication of anesthesia    woke up during colonoscopy   Depression    GERD (gastroesophageal reflux disease)    Hyperlipidemia    Hypertension    Neck pain    PONV (postoperative nausea and vomiting)    PVCs (premature ventricular contractions)    occasional PVCs on event monitor 12/2020   Systemic lupus erythematosus    Past Surgical History:  Procedure Laterality Date   ABDOMINAL HYSTERECTOMY     APPENDECTOMY     arm surgery Left    CERVICAL SPINE SURGERY     ROBOTIC ASSITED PARTIAL NEPHRECTOMY Left 03/11/2021   Procedure: XI ROBOTIC ASSITED PARTIAL NEPHRECTOMY;  Surgeon: Rene PaciWinter,  Christopher Aaron, MD;  Location: [facility];  Service: Urology;  Laterality: Left;     No outpatient medications have been marked as taking for the 09/17/22 encounter (Video Visit) with Quintella Reicherturner, Devarious Pavek R, MD.     Allergies:   Ivp dye [iodinated contrast media], Iodine, Penicillins, and Sulfa antibiotics   Social History   Tobacco Use   Smoking status: Some Days    Years: 20    Types: Cigarettes    Passive exposure: Never   Smokeless tobacco: Never   Tobacco comments:    1 cig some days  Vaping Use   Vaping Use: Never used  Substance Use Topics   Alcohol use: No   Drug use: No     Family Hx: The patient's family history includes Heart attack (age of onset: 254) in her sister; Heart attack (age of onset: 4062) in her father; Kidney failure in her mother.  ROS:   Please see the history of present illness.     All other systems reviewed and are negative.   Prior CV studies:   The following studies were reviewed today:  none  Labs/Other Tests and Data Reviewed:    EKG:  No ECG reviewed.  Recent Labs: No results found for requested labs within last 365 days.   Recent Lipid Panel No results found for: "CHOL", "TRIG", "HDL", "CHOLHDL", "LDLCALC", "LDLDIRECT"  Wt Readings from Last 3 Encounters:  09/17/22 138 lb (62.6 kg)  08/20/21 143 lb 12.8 oz (65.2 kg)  06/09/21 148 lb (67.1 kg)     Risk Assessment/Calculations:          Objective:    Vital Signs:  BP 138/65   Pulse (!) 55   Ht 5' 7.5" (1.715 m)   Wt 138 lb (62.6 kg)   BMI 21.29 kg/m    VITAL SIGNS:  reviewed GEN:  no acute distress EYES:  sclerae anicteric, EOMI - Extraocular Movements Intact RESPIRATORY:  normal respiratory effort, symmetric expansion CARDIOVASCULAR:  no peripheral edema SKIN:  no rash, lesions or ulcers. MUSCULOSKELETAL:  no obvious deformities. NEURO:  alert and oriented x 3, no obvious focal deficit PSYCH:  normal affect  ASSESSMENT & PLAN:    1.  Chest pain -this has been  atypical  -nuclear stress test 10/2020 showed no ischemia -she now has had several episodes of prolonged CP that she describes as pressure but occur at rest.  She is under a lot of stress -[facility] to get on the list for a kidney transplant and is now listed -She has multiple CRF but unfortunately cannot do coronary CT due to CKD -Recommend Stress PET CT to assess blood flow and rule out  ischemia -Shared Decision Making/Informed Consent The risks [chest pain, shortness of breath, cardiac arrhythmias, dizziness, blood pressure fluctuations, myocardial infarction, stroke/transient ischemic attack, nausea, vomiting, allergic reaction, radiation exposure, metallic taste sensation and life-threatening complications (estimated to be 1 in 10,000)], benefits (risk stratification, diagnosing coronary artery disease, treatment guidance) and alternatives of a cardiac PET stress test were discussed in detail with Patricia Dominguez and she agrees to proceed.   2.  HTN -BP controlled on exam -Continue prescription drug management with amlodipine 10 mg daily, carvedilol 6.25 mg twice daily, doxazosin 2 mg nightly, hydralazine 100 mg 3 times daily with as needed refills -I instructed her to limit her Na intake to < 2gm daily    3.  Palpitations at she did the heart monitor and turned it back but there is no report that can be found  -30-day event monitor 12/31/2020 showed a rare PVC     Time:   Today, I have spent 15 minutes with the patient with telehealth technology discussing the above problems.     Medication Adjustments/Labs and Tests Ordered: Current medicines are reviewed at length with the patient today.  Concerns regarding medicines are outlined above.   Tests Ordered: No orders of the defined types were placed in this encounter.   Medication Changes: No orders of the defined types were placed in this encounter.   Follow Up:  followup with me in 1 year   Signed, Armanda Magicraci Lilou Kneip,  MD  09/17/2022 9:13 AM    [facility]

## 2022-09-17 NOTE — Patient Instructions (Signed)
Medication Instructions:  Your physician recommends that you continue on your current medications as directed. Please refer to the Current Medication list given to you today.] *If you need a refill on your cardiac medications before your next appointment, please call your pharmacy*  Follow-Up: At [facility], you and your health needs are our priority.  As part of our continuing mission to provide you with exceptional heart care, we have created designated Provider Care Teams.  These Care Teams include your primary Cardiologist (physician) and Advanced Practice Providers (APPs -  Physician Assistants and Nurse Practitioners) who all work together to provide you with the care you need, when you need it.   Your next appointment:   1 year(s)  Provider:   Armanda Magicraci Turner, MD     Other Instructions How to Prepare for Your Cardiac PET/CT Stress Test:  1. Please do not take these medications before your test:   Medications that may interfere with the cardiac pharmacological stress agent (ex. nitrates - including erectile dysfunction medications, isosorbide mononitrate, tamulosin or beta-blockers) the day of the exam. (Erectile dysfunction medication should be held for at least 72 hrs prior to test) Theophylline containing medications for 12 hours. Dipyridamole 48 hours prior to the test. Your remaining medications may be taken with water.  2. Nothing to eat or drink, except water, 3 hours prior to arrival time.   NO caffeine/decaffeinated products, or chocolate 12 hours prior to arrival.  3. NO perfume, cologne or lotion  4. Total time is 1 to 2 hours; you may want to bring reading material for the waiting time.  5. Please report to Radiology at the [facility] Main Entrance 30 minutes early for your test.  [address]  [place], [place] [id number]  Diabetic Preparation:  Hold oral medications. You may take NPH and Lantus insulin. Do not take Humalog or Humulin R  (Regular Insulin) the day of your test. Check blood sugars prior to leaving the house. If able to eat breakfast prior to 3 hour fasting, you may take all medications, including your insulin, Do not worry if you miss your breakfast dose of insulin - start at your next meal.  IF YOU THINK YOU MAY BE PREGNANT, OR ARE NURSING PLEASE INFORM THE TECHNOLOGIST.  In preparation for your appointment, medication and supplies will be purchased.  Appointment availability is limited, so if you need to cancel or reschedule, please call the Radiology Department at [phone]  24 hours in advance to avoid a cancellation fee of $100.00  What to Expect After you Arrive:  Once you arrive and check in for your appointment, you will be taken to a preparation room within the Radiology Department.  A technologist or Nurse will obtain your medical history, verify that you are correctly prepped for the exam, and explain the procedure.  Afterwards,  an IV will be started in your arm and electrodes will be placed on your skin for EKG monitoring during the stress portion of the exam. Then you will be escorted to the PET/CT scanner.  There, staff will get you positioned on the scanner and obtain a blood pressure and EKG.  During the exam, you will continue to be connected to the EKG and blood pressure machines.  A small, safe amount of a radioactive tracer will be injected in your IV to obtain a series of pictures of your heart along with an injection of a stress agent.    After your Exam:  It is recommended  that you eat a meal and drink a caffeinated beverage to counter act any effects of the stress agent.  Drink plenty of fluids for the remainder of the day and urinate frequently for the first couple of hours after the exam.  Your doctor will inform you of your test results within 7-10 business days.  For questions about your test or how to prepare for your test, please call: Rockwell Alexandria, Cardiac Imaging Nurse Navigator   Larey Brick, Cardiac Imaging Nurse Navigator Office: [phone]

## 2022-09-17 NOTE — Addendum Note (Signed)
Addended by: Alvin Critchley A on: 09/17/2022 10:21 AM   Modules accepted: Orders

## 2022-09-20 ENCOUNTER — Ambulatory Visit: Payer: 59 | Admitting: Physician Assistant

## 2022-09-23 ENCOUNTER — Other Ambulatory Visit: Payer: Self-pay

## 2022-09-23 DIAGNOSIS — I493 Ventricular premature depolarization: Secondary | ICD-10-CM

## 2022-09-27 ENCOUNTER — Telehealth (HOSPITAL_COMMUNITY): Payer: Self-pay | Admitting: *Deleted

## 2022-09-27 NOTE — Telephone Encounter (Signed)
Attempted to call patient regarding upcoming cardiac PET appointment. Left message on voicemail with name and callback number  Larey Brick RN Navigator Cardiac Imaging [facility] [phone] Office [phone] Cell  Reminder for patient to hold caffeine 12 hours prior to her cardiac PET scan.

## 2022-09-28 ENCOUNTER — Telehealth (HOSPITAL_COMMUNITY): Payer: Self-pay | Admitting: *Deleted

## 2022-09-28 ENCOUNTER — Encounter: Payer: 59 | Admitting: Nutrition

## 2022-09-28 NOTE — Telephone Encounter (Signed)
Reaching out to patient to offer assistance regarding upcoming cardiac imaging study; pt verbalizes understanding of appt date/time, parking situation and where to check in, pre-test NPO status and verified current allergies; name and call back number provided for further questions should they arise  Larey Brick RN Navigator Cardiac Imaging [facility] [phone] office [phone] cell  Patient aware to avoid caffeine 12 hours prior o her cardiac PET scan.

## 2022-09-29 ENCOUNTER — Encounter (HOSPITAL_COMMUNITY)
Admission: RE | Admit: 2022-09-29 | Discharge: 2022-09-29 | Disposition: A | Discharge status: Home / Self Care | Payer: 59 | Source: Ambulatory Visit | Attending: Physician Assistant | Admitting: Physician Assistant

## 2022-09-29 DIAGNOSIS — R002 Palpitations: Secondary | ICD-10-CM | POA: Insufficient documentation

## 2022-09-29 DIAGNOSIS — R079 Chest pain, unspecified: Secondary | ICD-10-CM | POA: Insufficient documentation

## 2022-09-29 DIAGNOSIS — I10 Essential (primary) hypertension: Secondary | ICD-10-CM

## 2022-09-29 LAB — NM PET CT CARDIAC PERFUSION MULTI W/ABSOLUTE BLOODFLOW
LV dias vol: 123 mL (ref 46–106)
LV sys vol: 27 mL
MBFR: 1.4
Nuc Rest EF: 78 %
Nuc Stress EF: 78 %
Rest MBF: 1.59 ml/g/min
Rest Nuclear Isotope Dose: 16.2 mCi
ST Depression (mm): 0 mm
Stress MBF: 2.23 ml/g/min
Stress Nuclear Isotope Dose: 16.2 mCi
TID: 0.99

## 2022-09-29 MED ORDER — RUBIDIUM RB82 GENERATOR (RUBYFILL)
25.0000 | PACK | Freq: Once | INTRAVENOUS | Status: AC
  Administered 2022-09-29: 16.16 via INTRAVENOUS

## 2022-09-29 MED ORDER — RUBIDIUM RB82 GENERATOR (RUBYFILL)
25.0000 | PACK | Freq: Once | INTRAVENOUS | Status: AC
  Administered 2022-09-29: 16.15 via INTRAVENOUS

## 2022-09-29 MED ORDER — REGADENOSON 0.4 MG/5ML IV SOLN
0.4000 mg | Freq: Once | INTRAVENOUS | Status: AC
  Administered 2022-09-29: 0.4 mg via INTRAVENOUS

## 2022-09-29 MED ORDER — REGADENOSON 0.4 MG/5ML IV SOLN
INTRAVENOUS | Status: AC
  Filled 2022-09-29: qty 5

## 2022-09-29 NOTE — Progress Notes (Signed)
Patient presents for a cardiac PET stress test and tolerated procedure without incident. Patient maintained acceptable vital signs throughout the test and was offered caffeine after test.  Patient taken out in a wheelchair.

## 2022-10-25 ENCOUNTER — Encounter: Payer: 59 | Attending: Physician Assistant | Admitting: Nutrition

## 2022-10-25 VITALS — Ht 67.5 in | Wt 137.0 lb

## 2022-10-25 DIAGNOSIS — N184 Chronic kidney disease, stage 4 (severe): Secondary | ICD-10-CM | POA: Diagnosis present

## 2022-10-25 DIAGNOSIS — I10 Essential (primary) hypertension: Secondary | ICD-10-CM | POA: Insufficient documentation

## 2022-10-25 NOTE — Progress Notes (Unsigned)
Medical Nutrition Therapy  Appointment Start time:  1600  Appointment End time:  1730  Primary concerns today: CKD  Referral diagnosis: N18.9 Preferred learning style: No preference  Learning readiness: Ready    NUTRITION ASSESSMENT  67 yr old bfemale being seen through [facility] for visit today due to weakness and strength to make appt. Her aide is there with her. She doesn't know what to eat. Is afraid of eating certain foods. Feels weak and has poor energy level. Fatigued. Dr. Vallery Sa. Nephronologist PMH: Stg 4 CKD, Depression with anxiety, HLD, Goiter, HTN, PVS, Anemia  B12 shots have been from MD for the last 2 months. Has migraines frequently.  Typically eats 2-3 meals per day day. Appetite poor. Waiting on a kidney transplant from a live donor if possible or will wait for a deceased donor's kidney. Kidney disease is reported to be related to her uncontrolled HTN in the past.  She has lost weight. Has lost about 6-8 lbs due to not knowing what to eat and afraid to eat things that will make her kidney's worst. Wanting to know what she can eat to improve her health and build back strength and weight gain.  Anthropometrics   Wt Readings from Last 3 Encounters:  10/25/22 137 lb (62.1 kg)  09/17/22 138 lb (62.6 kg)  08/20/21 143 lb 12.8 oz (65.2 kg)   Ht Readings from Last 3 Encounters:  10/25/22 5' 7.5" (1.715 m)  09/17/22 5' 7.5" (1.715 m)  08/20/21 5' 7.5" (1.715 m)   Body mass index is 21.14 kg/m. @BMIFA @ Facility age limit for growth %iles is 20 years. Facility age limit for growth %iles is 20 years.  .  Clinical Medical Hx: See chart Medications: See chart Labs:     Latest Ref Rng & Units 03/13/2021    4:55 AM 03/12/2021    4:32 AM 03/09/2021    2:44 PM  CMP  Glucose 70 - 99 mg/dL 161  096  045   BUN 8 - 23 mg/dL 43  42  53   Creatinine 0.44 - 1.00 mg/dL 4.09  8.11  9.14   Sodium 135 - 145 mmol/L 141  138  144   Potassium 3.5 - 5.1 mmol/L 4.9  4.2  4.6    Chloride 98 - 111 mmol/L 110  107  109   CO2 22 - 32 mmol/L 22  25  26    Calcium 8.9 - 10.3 mg/dL 9.3  8.8  9.6   Total Protein 6.5 - 8.1 g/dL   7.9   Total Bilirubin 0.3 - 1.2 mg/dL   0.6   Alkaline Phos 38 - 126 U/L   62   AST 15 - 41 U/L   20   ALT 0 - 44 U/L   13    Notable Signs/Symptoms: weak, poor appetite, no energy, fatigued.  Lifestyle & Dietary Hx Lives by herself, Has an CNA that helps her.  Estimated daily fluid intake: 30 oz Supplements: B12 Sleep: varies Stress / self-care: her kidney issues are stressing her out a lot Current average weekly physical activity: ADL due to weakness  24-Hr Dietary Recall First Meal: Oatmeal or 2 boiled eggs and toast white, coffee Snack: grapes or yogurt Second Meal: Grilled chicken salad, [place] dressing, water Snack:  Third Meal: Cabbage, [place] and broccoli,  water Snack:  Beverages: water  Estimated Energy Needs Calories: 1800 Carbohydrate: 200g Protein: 50-60g Fat: 60g   NUTRITION DIAGNOSIS  NI-5.11.1 Predicted suboptimal nutrient intake As related  to CKD with calories, protein and CHO.  As evidenced by Weight loss of 6-8 lbs and elevated Creatine levels..   NUTRITION INTERVENTION  Nutrition education (E-1) on the following topics:  CKD disease process, sodium, potassium and phosphorus needs. Calorie and protein requirements. Prevention of malnutrition, Reading food lables.  Eating small frequent nutrient dense foods Plant based foods and avoiding processed foods Hydration needs    Handouts Provided Include  Referred to [employer] for handouts and education information on diet, cooking instructions, recipes and disease process.   Learning Style & Readiness for Change Teaching method utilized: Visual & Auditory  Demonstrated degree of understanding via: Teach Back  Barriers to learning/adherence to lifestyle change: none  Goals Established by Pt Eat 3 meals and 3 snacks per day Focus on quality  protein and nutrient dense foods from guides given Increase calories with healthy fats, sugars and calories for needed weight gain. Go to [email or web address] website to obtain more information on recipes, nutrition tips and importance of overall nutrition recommendations.   MONITORING & EVALUATION Dietary intake, weekly physical activity, and weight  in 1 month.  Next Steps  Patient is to work on eating small frequent meals and snacks based on guidelines given.

## 2022-10-27 ENCOUNTER — Encounter: Payer: Self-pay | Admitting: Nutrition

## 2022-12-02 ENCOUNTER — Encounter: Payer: 59 | Admitting: Nutrition

## 2023-01-25 ENCOUNTER — Encounter: Payer: 59 | Attending: Physician Assistant | Admitting: Nutrition

## 2023-01-25 VITALS — Wt 138.0 lb

## 2023-01-25 DIAGNOSIS — I10 Essential (primary) hypertension: Secondary | ICD-10-CM | POA: Insufficient documentation

## 2023-01-25 DIAGNOSIS — N184 Chronic kidney disease, stage 4 (severe): Secondary | ICD-10-CM | POA: Insufficient documentation

## 2023-01-25 NOTE — Progress Notes (Unsigned)
Medical Nutrition Therapy  Phone VIsist instead of computer visit due to her computer not working.  This visit was completed via telephone I spoke with Patricia Dominguez and verified that I was speaking with the correct person with two patient identifiers (full name and date of birth).   I discussed the limitations related to this kind of visit and the patient is willing to proceed.  Appointment Start time:  1300  Appointment End time:  1330  Primary concerns today: CKD follow up Referral diagnosis: N18.9 Preferred learning style: No preference  Learning readiness: Ready    NUTRITION ASSESSMENT  CKD follow up. "My daughter is getting tested right now to see if she can qualify for being a donor." Saw her Nephrologist and was given a good report that things are improving slightly. eGFR up to a 19 from a 17 according to her. Wt last 138 lbs at [facility] office. She has been working on eating 3 meals per day and 2 snacks a day to help improve weight gain and her overall nutritional status.  Previous goals:  Eat 3 meals and 3 snacks per day-trying to eat more Focus on quality protein and nutrient dense foods from guides given- Increase calories with healthy fats, sugars and calories for needed weight gain.-done Go to [email or web address] website to obtain more information on recipes, nutrition tips and importance of overall nutrition recommendations.-done  Anthropometrics   Wt Readings from Last 3 Encounters:  10/25/22 137 lb (62.1 kg)  09/17/22 138 lb (62.6 kg)  08/20/21 143 lb 12.8 oz (65.2 kg)   Ht Readings from Last 3 Encounters:  10/25/22 5' 7.5" (1.715 m)  09/17/22 5' 7.5" (1.715 m)  08/20/21 5' 7.5" (1.715 m)   There is no height or weight on file to calculate BMI. @BMIFA @ Facility age limit for growth %iles is 20 years. Facility age limit for growth %iles is 20 years.  Clinical Medical Hx: See chart Medications: See chart Labs:     Latest Ref Rng & Units 03/13/2021    4:55 AM  03/12/2021    4:32 AM 03/09/2021    2:44 PM  CMP  Glucose 70 - 99 mg/dL 161  096  045   BUN 8 - 23 mg/dL 43  42  53   Creatinine 0.44 - 1.00 mg/dL 4.09  8.11  9.14   Sodium 135 - 145 mmol/L 141  138  144   Potassium 3.5 - 5.1 mmol/L 4.9  4.2  4.6   Chloride 98 - 111 mmol/L 110  107  109   CO2 22 - 32 mmol/L 22  25  26    Calcium 8.9 - 10.3 mg/dL 9.3  8.8  9.6   Total Protein 6.5 - 8.1 g/dL   7.9   Total Bilirubin 0.3 - 1.2 mg/dL   0.6   Alkaline Phos 38 - 126 U/L   62   AST 15 - 41 U/L   20   ALT 0 - 44 U/L   13    Notable Signs/Symptoms: weak, poor appetite, no energy, fatigued.  Lifestyle & Dietary Hx Lives by herself, Has an CNA that helps her.  Estimated daily fluid intake: 30 oz Supplements: B12 Sleep: varies Stress / self-care: her kidney issues are stressing her out a lot Current average weekly physical activity: ADL due to weakness  24-Hr Dietary Recall First Meal: boiled eggs, oatmeal, water Snack:  Second Meal: getting ready to eat lunch with [place] sandwich with lettuce,  water, milk Third Meal:  chicken legs, greens, corn and corn bread, grapes, Beverages: water  Estimated Energy Needs Calories: 1800 Carbohydrate: 200g Protein: 50-60g (8 g/kg) Fat: 60g   NUTRITION DIAGNOSIS  NI-5.11.1 Predicted suboptimal nutrient intake As related to CKD with calories, protein and CHO.  As evidenced by Weight loss of 6-8 lbs and elevated Creatine levels..   NUTRITION INTERVENTION  Nutrition education (E-1) on the following topics:  CKD disease process, sodium, potassium and phosphorus needs. Calorie and protein requirements. Prevention of malnutrition, Reading food lables.  Eating small frequent nutrient dense foods Plant based foods and avoiding processed foods Hydration needs    Handouts Provided Include  Referred to [employer] for handouts and education information on diet, cooking instructions, recipes and disease process.   Learning Style & Readiness for  Change Teaching method utilized: Visual & Auditory  Demonstrated degree of understanding via: Teach Back  Barriers to learning/adherence to lifestyle change: none  Goals Established by Pt   MONITORING & EVALUATION Dietary intake, weekly physical activity, and weight  in 3 month.  Next Steps  Patient is to work on eating small frequent meals and snacks based on guidelines given.

## 2023-01-26 ENCOUNTER — Encounter: Payer: Self-pay | Admitting: Nutrition

## 2023-01-26 NOTE — Patient Instructions (Signed)
Goals Continue to increase Whole Plant Based foods with eating 3 meals per day and 2-3 snacks for needed calories. Drink your allowance of water given by your kidney doctor. May use more dried beans, peas and lentils for protein sources since better for kidneys and easier on your GI. Your overall nutrition is very important to prepare for transplant. Keep up the good job! Don't skip meals. Small frequent meals may work best.

## 2023-03-08 ENCOUNTER — Telehealth: Payer: 59 | Admitting: Nutrition

## 2023-04-05 ENCOUNTER — Telehealth: Payer: 59 | Admitting: Nutrition

## 2023-04-13 ENCOUNTER — Encounter: Payer: Self-pay | Admitting: Neurology

## 2023-05-03 ENCOUNTER — Encounter: Payer: Self-pay | Admitting: Neurology

## 2023-05-03 ENCOUNTER — Ambulatory Visit (INDEPENDENT_AMBULATORY_CARE_PROVIDER_SITE_OTHER): Payer: 59 | Admitting: Neurology

## 2023-05-03 ENCOUNTER — Telehealth: Payer: Self-pay

## 2023-05-03 VITALS — BP 127/64 | HR 66 | Ht 66.0 in | Wt 143.0 lb

## 2023-05-03 DIAGNOSIS — M26609 Unspecified temporomandibular joint disorder, unspecified side: Secondary | ICD-10-CM

## 2023-05-03 DIAGNOSIS — N184 Chronic kidney disease, stage 4 (severe): Secondary | ICD-10-CM | POA: Diagnosis not present

## 2023-05-03 DIAGNOSIS — G43719 Chronic migraine without aura, intractable, without status migrainosus: Secondary | ICD-10-CM

## 2023-05-03 NOTE — Patient Instructions (Signed)
Plan to start Botox Stop Nurtec.  At earliest onset of migraine, take Ubrelvy.  May repeat after 2 hours.  Maximum 2 tablets in 24 hours.   Take Zofran at earliest onset of migraine as well, since you have nausea with it. Limit use of pain relievers to no more than 2 days out of week to prevent risk of rebound or medication-overuse headache. Keep headache diary Ask you kidney doctor about BACLOFEN - is it okay to take and at what dose Follow up for Botox

## 2023-05-03 NOTE — Progress Notes (Addendum)
NEUROLOGY CONSULTATION NOTE  [name] MRN: [id number] DOB: Mar 29, 1956  Referring provider: Chana Bode, DO Primary care provider: Baxter Kail Mayo, PA-C  Reason for consult:  headache  Assessment/Plan:   Chronic migraine without aura, without status migrainosus, intractable Right TMJ dysfunction CKD stage 4  Daily migraines for almost one year.  Previously tried beta blocker.  Would no use topiramate as she has metabolic acidosis.  Medication options limited due to advanced kidney disease.  She would benefit from Botox. Migraine prevention:  Plan to start Botox. Migraine rescue:  Will have her try samples of Ubrelvy.  Zofran for nausea Advised to follow up with dentist about mouth guard.  Limit use of pain relievers to no more than 2 days out of week to prevent risk of rebound or medication-overuse headache. Keep headache diary Follow up for Botox.     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  History supplemented by her accompanying friend/caregiver and nephrology note.  Onset:  She started having headaches around 68 years old.  They became worse in December 2023 when she accidentally walked into a sheet of glass.  She did pass out.  Went to the ED in [place] where CT head revealed no acute findings.   Location:  Right frontal region which may spread to top of head or down the right side of her jaw, where she has TMJ dysfunction Quality:  feels like somebody is drilling into her head.   Intensity:  severe.   Aura:  absent Prodrome:  absent Associated symptoms:  Nausea, vomiting, photophobia, phonophobia, blurred vision, feels off balance, bilateral neck pain.  She denies associated  unilateral numbness or weakness. Duration:  2 days. Frequency:  almost daily with a migraine headache.   Triggers:  elevated blood pressure (if she misses her medication) Relieving factors:  massaging head and peppermint oil and cold pack, coffee Activity:  cannot move or function Takes Nurtec as needed but no longer effective.   Past NSAIDS/analgesics:  cannot take NSAIDs due to kidney disease, Tylenol Past abortive triptans:  none Past abortive ergotamine:  none Past muscle relaxants:  Flexeril Past anti-emetic:  none Past antihypertensive medications:  metoprolol, labetolol Past antidepressant medications:  Wellbutrin Past anticonvulsant medications:  none Past anti-CGRP:  none.  Would not use Aimovig due to poor to control HTN (on several medications) Other past therapies:  none  Current NSAIDS/analgesics:  Goody powder, oxycodone, ASA 81mg  daily Current triptans:  none Current ergotamine:  none Current anti-emetic:  Zofran ODT 4mg , promethazine 12.5mg  Current muscle relaxants:  none Current Antihypertensive medications:  carvedilol, losartan, hydralazine, amlodipine, furosemide Current Antidepressant medications:  none Current Anticonvulsant medications:  gabapentin 300mg  TID Current anti-CGRP:  Nurtec PRN Current Vitamins/Herbal/Supplements:  sodium bicarb (for metabolic acidosis), magnesium oxide 420mg  daily, ferrous sulfate, D Current Antihistamines/Decongestants:  none Other therapy:  none   Caffeine:  Coffee once or twice a week Alcohol:  no Smoker:  cut down on smoking.  Now just one puff once a week. Diet:  Drinks six 16 oz bottles of water daily.  Doesn't skip meals; has a dietician now. Exercise:  walk, stretching Depression/Anxiety:  yes Sleep hygiene:  poor  She had a positive ANA.  Worked up by rheumatology  who ruled out an autoimmune disease such as lupus. Family history of headache:  no  04/05/2023 LABS:  Cr 2.58, BUN 65, eGFR 20, K 4.8, bicarb 22, phos  4.8, Hb 11, PTH 98      PAST MEDICAL HISTORY: Past Medical History:  Diagnosis Date   Arthritis    Back pain    Breast pain    Chronic kidney disease    Complication of anesthesia    woke up during colonoscopy   Depression    GERD (gastroesophageal reflux disease)    Hyperlipidemia    Hypertension    Neck pain    PONV (postoperative nausea and vomiting)    PVCs (premature ventricular contractions)    occasional PVCs on event monitor 12/2020   Systemic lupus erythematosus (HCC)     PAST SURGICAL HISTORY: Past Surgical History:  Procedure Laterality Date   ABDOMINAL HYSTERECTOMY     APPENDECTOMY     arm surgery Left    CERVICAL SPINE SURGERY     ROBOTIC ASSITED PARTIAL NEPHRECTOMY Left 03/11/2021   Procedure: XI ROBOTIC ASSITED PARTIAL NEPHRECTOMY;  Surgeon: Rene Paci, MD;  Location: [facility];  Service: Urology;  Laterality: Left;    MEDICATIONS: Current Outpatient Medications on File Prior to Visit  Medication Sig Dispense Refill   amLODipine (NORVASC) 10 MG tablet Take 10 mg by mouth daily.     atorvastatin (LIPITOR) 20 MG tablet Take 20 mg by mouth at bedtime.     carvedilol (COREG) 6.25 MG tablet TAKE 1 TABLET(6.25 MG) BY MOUTH TWICE DAILY 60 tablet 0   docusate sodium (COLACE) 100 MG capsule Take 1 capsule (100 mg total) by mouth 2 (two) times daily.     doxazosin (CARDURA) 2 MG tablet Take 2 mg by mouth daily with supper.     ferrous sulfate 325 (65 FE) MG tablet Take 325 mg by mouth daily with breakfast.     furosemide (LASIX) 40 MG tablet Take 40 mg by mouth every Monday, Wednesday, and Friday.     gabapentin (NEURONTIN) 300 MG capsule TAKE 1 CAPSULE(300 MG) BY MOUTH THREE TIMES DAILY FOR 14 DAYS 42 capsule 0   hydrALAZINE (APRESOLINE) 100 MG tablet Take 100 mg by mouth 3 (three) times daily.     LINZESS 145 MCG CAPS capsule Take 145 mcg by mouth daily as needed for constipation.     Menthol-Methyl Salicylate (BEN GAY GREASELESS) 10-15 % greaseless  cream Apply 1 application topically 3 (three) times daily as needed for pain.     NARCAN 4 MG/0.1ML LIQD nasal spray kit Place 1 spray into the nose as needed. Overdose     ondansetron (ZOFRAN-ODT) 4 MG disintegrating tablet Take 4 mg by mouth every 8 (eight) hours as needed for nausea or vomiting.     sodium bicarbonate 650 MG tablet Take 650 mg by mouth 2 (two) times daily.     zolpidem (AMBIEN) 5 MG tablet Take 5 mg by mouth at bedtime as needed for sleep.     No current facility-administered medications on file prior to visit.    ALLERGIES: Allergies  Allergen Reactions   Ivp Dye [Iodinated Contrast Media] Nausea And Vomiting   Iodine Other (See Comments)    Pt unsure of reaction    Penicillins Other (See Comments)    Passed out after penicillin injection at 67 years old. Has tolerated as an adult.   Sulfa Antibiotics Other (See Comments)    Unknown allergic reaction    FAMILY HISTORY:  Family History  Problem Relation Age of Onset   Kidney failure Mother    Heart attack Father 48   Heart attack Sister 68    Objective:  Blood pressure 127/64, pulse 66, height 5\' 6"  (1.676 m), weight 143 lb (64.9 kg), SpO2 100%. General: No acute distress.  Patient appears well-groomed.   Head:  Normocephalic/atraumatic, right TMJ tenderness Eyes:  fundi examined but not visualized Neck: supple, bilateral paraspinal tenderness, full range of motion Heart: regular rate and rhythm Neurological Exam: Mental status: alert and oriented to person, place, and time, speech fluent and not dysarthric, language intact. Cranial nerves: CN I: not tested CN II: pupils equal, round and reactive to light, visual fields intact CN III, IV, VI:  full range of motion, no nystagmus, no ptosis CN V: facial sensation reduced right V1-V2 CN VII: upper and lower face symmetric CN VIII: hearing intact CN IX, X: gag intact, uvula midline CN XI: sternocleidomastoid and trapezius muscles intact CN XII: tongue  midline Bulk & Tone: normal, no fasciculations. Motor:  muscle strength 5/5 throughout Sensation:  Pinprick, temperature and vibratory sensation intact. Deep Tendon Reflexes:  2+ throughout,  toes downgoing.   Finger to nose testing:  Slowed, mildly ataxic  Gait:  Cautious.  Ambulates with cane.  Romberg with sway.    Thank you for allowing me to take part in the care of this patient.  Shon Millet, DO  CC:  Baxter Kail Mayo, PA-C [place], [place]

## 2023-05-03 NOTE — Telephone Encounter (Signed)
Patient seen in office today, Patient to start Botox

## 2023-05-09 ENCOUNTER — Encounter: Payer: Self-pay | Admitting: Nutrition

## 2023-05-09 ENCOUNTER — Other Ambulatory Visit: Payer: Self-pay | Admitting: Neurology

## 2023-05-09 ENCOUNTER — Encounter: Payer: Self-pay | Admitting: Neurology

## 2023-05-09 ENCOUNTER — Encounter: Payer: 59 | Attending: Physician Assistant | Admitting: Nutrition

## 2023-05-09 DIAGNOSIS — N184 Chronic kidney disease, stage 4 (severe): Secondary | ICD-10-CM | POA: Diagnosis present

## 2023-05-09 DIAGNOSIS — I10 Essential (primary) hypertension: Secondary | ICD-10-CM | POA: Insufficient documentation

## 2023-05-09 MED ORDER — ONDANSETRON 4 MG PO TBDP: 4.0000 mg | ORAL_TABLET | Freq: Three times a day (TID) | ORAL | 5 refills | Status: DC | PRN

## 2023-05-09 MED ORDER — UBRELVY 100 MG PO TABS: 1.0000 | ORAL_TABLET | ORAL | 11 refills | Status: DC | PRN

## 2023-05-09 NOTE — Progress Notes (Signed)
Medical Nutrition Therapy  Phone VIsist instead of computer visit due to her computer not working. 1430   End 1500 \  Primary concerns today: CKD follow up Referral diagnosis: N18.9 Preferred learning style: No preference  Learning readiness: Ready   This visit was completed via telephone I spoke with Patricia Dominguez and verified that I was speaking with the correct person with two patient identifiers (full name and date of birth).    I discussed the limitations related to this kind of visit and the patient is willing to proceed.   NUTRITION ASSESSMENT  CKD follow up. Wt is 147 lbs: Wt up 9 lbs. No swelling reported. Has been reading labels and avoiding processed foods. She went to [employer] and learned a lot from the website on nutrition and recipes. Her CKD Dr. Catalina Pizza her kidney function was stable. eGFR 20 Has been working on eating better quality foods; 3 meals and occassional snack Still suffers from migraines. Avoiding foods high in MSG as best as she knows how to. Still complains of poor energy.   Previous goals:  Eat 3 meals and 3 snacks per day-trying to eat more Focus on quality protein and nutrient dense foods from guides given- Increase calories with healthy fats, sugars and calories for needed weight gain.-done Go to [email or web address] website to obtain more information on recipes, nutrition tips and importance of overall nutrition recommendations.-done  Anthropometrics   Wt Readings from Last 3 Encounters:  05/03/23 143 lb (64.9 kg)  01/25/23 138 lb (62.6 kg)  10/25/22 137 lb (62.1 kg)   Ht Readings from Last 3 Encounters:  05/03/23 5\' 6"  (1.676 m)  10/25/22 5' 7.5" (1.715 m)  09/17/22 5' 7.5" (1.715 m)   There is no height or weight on file to calculate BMI. @BMIFA @ Facility age limit for growth %iles is 20 years. Facility age limit for growth %iles is 20 years.  Clinical Medical Hx: See chart Medications: See chart Labs:     Latest Ref Rng & Units  03/13/2021    4:55 AM 03/12/2021    4:32 AM 03/09/2021    2:44 PM  CMP  Glucose 70 - 99 mg/dL 161  096  045   BUN 8 - 23 mg/dL 43  42  53   Creatinine 0.44 - 1.00 mg/dL 4.09  8.11  9.14   Sodium 135 - 145 mmol/L 141  138  144   Potassium 3.5 - 5.1 mmol/L 4.9  4.2  4.6   Chloride 98 - 111 mmol/L 110  107  109   CO2 22 - 32 mmol/L 22  25  26    Calcium 8.9 - 10.3 mg/dL 9.3  8.8  9.6   Total Protein 6.5 - 8.1 g/dL   7.9   Total Bilirubin 0.3 - 1.2 mg/dL   0.6   Alkaline Phos 38 - 126 U/L   62   AST 15 - 41 U/L   20   ALT 0 - 44 U/L   13    Notable Signs/Symptoms: weak, poor appetite, no energy, fatigued.  Lifestyle & Dietary Hx Lives by herself, Has an CNA that helps her.  Estimated daily fluid intake: 30 oz Supplements: B12 Sleep: varies Stress / self-care: her kidney issues are stressing her out a lot Current average weekly physical activity: ADL due to weakness  24-Hr Dietary Recall First Meal: oatmeal, bluberries and toast;  Snack:  Second Meal: chicken, strawberries with whip cream,  water Third Meal:salmon, asparagus, green peppers and onions,  toast, water  Beverages: water  Estimated Energy Needs Calories: 1800 Carbohydrate: 200g Protein: 50-60g (8 g/kg) Fat: 60g   NUTRITION DIAGNOSIS  NI-5.11.1 Predicted suboptimal nutrient intake As related to CKD with calories, protein and CHO.  As evidenced by Weight loss of 6-8 lbs and elevated Creatine levels..   NUTRITION INTERVENTION  Nutrition education (E-1) on the following topics:  CKD disease process, sodium, potassium and phosphorus needs. Calorie and protein requirements. Prevention of malnutrition, Reading food lables.  Eating small frequent nutrient dense foods Plant based foods and avoiding processed foods Hydration needs    Handouts Provided Include  Referred to [employer] for handouts and education information on diet, cooking instructions, recipes and disease process.   Learning Style & Readiness  for Change Teaching method utilized: Visual & Auditory  Demonstrated degree of understanding via: Teach Back  Barriers to learning/adherence to lifestyle change: none  Goals Established by Pt Keep up the great job! Increase protein rich foods of eggs and dried beans May eat potatoes/sweet potatoes 2-3 times per week. Medications can be adjusted if your potassium increases. Focus on eating nutrient dense foods for overall nutrition   MONITORING & EVALUATION Dietary intake, weekly physical activity, and weight  in 3 month.  Next Steps  Patient is to work on eating small frequent meals and snacks based on guidelines given.

## 2023-05-09 NOTE — Patient Instructions (Signed)
Keep up the great job! Increase protein rich foods of eggs and dried beans May eat potatoes/sweet potatoes 2-3 times per week. Medications can be adjusted if your potassium increases. Focus on eating nutrient dense foods for overall nutrition

## 2023-05-26 ENCOUNTER — Other Ambulatory Visit: Payer: Self-pay

## 2023-05-26 ENCOUNTER — Other Ambulatory Visit (HOSPITAL_COMMUNITY): Payer: Self-pay

## 2023-05-26 ENCOUNTER — Telehealth: Payer: Self-pay | Admitting: Pharmacy Technician

## 2023-05-26 DIAGNOSIS — G43719 Chronic migraine without aura, intractable, without status migrainosus: Secondary | ICD-10-CM

## 2023-05-26 MED ORDER — ONABOTULINUMTOXINA 200 UNITS IJ SOLR
INTRAMUSCULAR | 4 refills | Status: DC
  Filled 2023-05-26: qty 1, fill #0
  Filled 2023-05-31: qty 1, 90d supply, fill #0
  Filled 2023-08-18: qty 1, 90d supply, fill #1
  Filled 2023-11-11: qty 1, 90d supply, fill #2
  Filled 2024-02-20: qty 1, 90d supply, fill #3
  Filled 2024-05-15: qty 1, 90d supply, fill #4

## 2023-05-26 NOTE — Telephone Encounter (Signed)
A user error has taken place: encounter opened in error, closed for administrative reasons.

## 2023-05-26 NOTE — Telephone Encounter (Addendum)
Pharmacy Patient Advocate Encounter  BotoxOne verification has been submitted. Benefit Verification:  [facility]   Pharmacy PA has been submitted for BOTOX 200 via [employer] on CoverMyMeds. INSURANCE: [employer] Medicare DATE SUBMITTED: 05/26/23 KEY: [id number] Status is pending

## 2023-05-26 NOTE — Addendum Note (Signed)
Addended by: Leida Lauth on: 05/26/2023 02:50 PM   Modules accepted: Orders

## 2023-05-26 NOTE — Telephone Encounter (Signed)
Pharmacy Patient Advocate Encounter  Received notification from [facility] that Prior Authorization for Botox 200Unit has been APPROVED from 05/26/23 to 08/24/23. Ran test claim, Copay is $0. This test claim was processed through [facility]- copay amounts may vary at other pharmacies due to pharmacy/plan contracts, or as the patient moves through the different stages of their insurance plan.   PA #/Case ID/Reference #: PA Case ID #: [id number]

## 2023-05-31 ENCOUNTER — Other Ambulatory Visit (HOSPITAL_COMMUNITY): Payer: Self-pay

## 2023-05-31 ENCOUNTER — Other Ambulatory Visit: Payer: Self-pay

## 2023-05-31 NOTE — Telephone Encounter (Signed)
Script sent [employer] last week. Spoke to pharmacy will look into it.

## 2023-06-03 ENCOUNTER — Ambulatory Visit (INDEPENDENT_AMBULATORY_CARE_PROVIDER_SITE_OTHER): Payer: 59 | Admitting: Neurology

## 2023-06-03 DIAGNOSIS — G43719 Chronic migraine without aura, intractable, without status migrainosus: Secondary | ICD-10-CM

## 2023-06-03 MED ORDER — ONABOTULINUMTOXINA 100 UNITS IJ SOLR
200.0000 [IU] | Freq: Once | INTRAMUSCULAR | Status: AC
Start: 2023-06-03 — End: 2023-06-03
  Administered 2023-06-03: 155 [IU] via INTRAMUSCULAR

## 2023-06-03 NOTE — Progress Notes (Signed)

## 2023-07-14 LAB — LAB REPORT - SCANNED
Calcium: 10
EGFR: 21

## 2023-08-01 ENCOUNTER — Telehealth: Payer: Self-pay

## 2023-08-01 NOTE — Telephone Encounter (Signed)
-----   Message from Nurse Alcario Drought E sent at 07/29/2023  7:22 PM EST -----  ----- Message ----- From: Quintella Reichert, MD Sent: 07/15/2023   5:32 PM EST To: Luellen Pucker, RN  Is patient seeing a nephrology]ist

## 2023-08-01 NOTE — Telephone Encounter (Signed)
 Sent fax to Dr. Mercy Riding office requesting ASAP appt due to elevated Cr.

## 2023-08-01 NOTE — Telephone Encounter (Signed)
 Call to patient who verifies she sees Dr. Malen Gauze at [facility]. She does not currently have a follow up appointment scheduled with Dr. Malen Gauze even though her last Cr was 2.44. Patient endorses she is currently still taking her lasix and her losartan.  Patient due for 1 yr f/u in March w/ Dr. Mayford Knife, made appt for 09/08/23. Patient responses forwarded to Dr. Mayford Knife.

## 2023-08-01 NOTE — Telephone Encounter (Signed)
 Call to patient to advise that per Dr. Mayford Knife she needs to get in w/ her nephrologist as soon as possible due to elevated Cr and she is still on lasix and losartan. Patient verbalizes understanding.

## 2023-08-02 ENCOUNTER — Ambulatory Visit: Payer: 59 | Admitting: Neurology

## 2023-08-11 ENCOUNTER — Other Ambulatory Visit: Payer: Self-pay

## 2023-08-16 ENCOUNTER — Encounter (HOSPITAL_COMMUNITY): Payer: Self-pay

## 2023-08-18 ENCOUNTER — Telehealth: Payer: Self-pay

## 2023-08-18 ENCOUNTER — Other Ambulatory Visit: Payer: Self-pay

## 2023-08-18 NOTE — Telephone Encounter (Signed)
 PA needed for [employer]

## 2023-08-18 NOTE — Progress Notes (Signed)
 Specialty Pharmacy Refill Coordination Note  Patricia Dominguez is a 68 y.o. female contacted today regarding refills of specialty medication(s) OnabotulinumtoxinA (BOTOX)   Patient requested Courier to Provider Office   Delivery date: 08/22/23   Verified address: [facility] [address], [place], [place], [address]   Medication will be filled on 03.07.25.

## 2023-08-19 ENCOUNTER — Other Ambulatory Visit: Payer: Self-pay

## 2023-08-22 ENCOUNTER — Telehealth: Payer: Self-pay

## 2023-08-22 NOTE — Telephone Encounter (Signed)
 PA needed for Botox

## 2023-08-22 NOTE — Telephone Encounter (Signed)
 Received call from after hours service. Caller states he is needing to discuss a members prior authorization for Ubrelvy. [phone] submission number.

## 2023-08-26 ENCOUNTER — Other Ambulatory Visit (HOSPITAL_COMMUNITY): Payer: Self-pay

## 2023-08-26 ENCOUNTER — Telehealth: Payer: Self-pay | Admitting: Pharmacy Technician

## 2023-08-26 NOTE — Telephone Encounter (Signed)
 Pharmacy Patient Advocate Encounter  Received notification from [facility] that Prior Authorization for BOTOX 200 has been APPROVED from 3.14.25 to 6.14.25. Unable to obtain price due to refill too soon rejection, last fill date 3.6.25 next available fill date5.13.25   PA #/Case ID/Reference #:  [id number]

## 2023-08-26 NOTE — Telephone Encounter (Signed)
 Pharmacy Patient Advocate Encounter   Received notification from Physician's Office that prior authorization for BOTOX 200 is required/requested.   Insurance verification completed.   The patient is insured through [facility] .   Per test claim: PA required; PA submitted to above mentioned insurance via CoverMyMeds Key/confirmation #/EOC [id number] Status is pending

## 2023-08-29 ENCOUNTER — Other Ambulatory Visit (HOSPITAL_COMMUNITY): Payer: Self-pay

## 2023-08-29 ENCOUNTER — Telehealth: Payer: Self-pay | Admitting: Pharmacy Technician

## 2023-08-29 NOTE — Telephone Encounter (Signed)
 PA request has been Submitted. New Encounter has been or will be created for follow up. For additional info see Pharmacy Prior Auth telephone encounter from 08-29-2023.

## 2023-08-29 NOTE — Telephone Encounter (Signed)
 Pharmacy Patient Advocate Encounter   Received notification from Pt Calls Messages that prior authorization for Ubrelvy 100MG  tablets is required/requested.   Insurance verification completed.   The patient is insured through [place] .   Per test claim: PA required; PA submitted to above mentioned insurance via CoverMyMeds Key/confirmation #/EOC [id number] Status is pending

## 2023-08-31 NOTE — Telephone Encounter (Signed)
 Addressed in new encounter.

## 2023-09-02 ENCOUNTER — Ambulatory Visit (INDEPENDENT_AMBULATORY_CARE_PROVIDER_SITE_OTHER): Payer: 59 | Admitting: Neurology

## 2023-09-02 DIAGNOSIS — G43719 Chronic migraine without aura, intractable, without status migrainosus: Secondary | ICD-10-CM

## 2023-09-02 MED ORDER — ONABOTULINUMTOXINA 100 UNITS IJ SOLR
200.0000 [IU] | Freq: Once | INTRAMUSCULAR | Status: AC
Start: 2023-09-02 — End: 2023-09-02
  Administered 2023-09-02: 155 [IU] via INTRAMUSCULAR

## 2023-09-02 NOTE — Progress Notes (Signed)

## 2023-09-05 ENCOUNTER — Other Ambulatory Visit: Payer: Self-pay | Admitting: Nephrology

## 2023-09-05 DIAGNOSIS — R9389 Abnormal findings on diagnostic imaging of other specified body structures: Secondary | ICD-10-CM

## 2023-09-05 NOTE — Telephone Encounter (Signed)
 Pharmacy Patient Advocate Encounter  Received notification from [facility] that Prior Authorization for Ubrelvy 100MG  tablets  has been DENIED.  Full denial letter will be uploaded to the media tab. See denial reason below. UBRELVY TAB 100MG  is denied for not meeting the prior authorization requirement(s). Medication authorization requires the following: (1) One of the following: (A) You have trial and failure or intolerance to one triptan (for example: eletriptan, rizatriptan, sumatriptan). (B) You have a contraindication to all triptans  PA #/Case ID/Reference #: [id number]

## 2023-09-06 ENCOUNTER — Other Ambulatory Visit (HOSPITAL_COMMUNITY): Payer: Self-pay | Admitting: Nephrology

## 2023-09-06 DIAGNOSIS — Z01818 Encounter for other preprocedural examination: Secondary | ICD-10-CM

## 2023-09-07 NOTE — Progress Notes (Unsigned)
 Date:  09/08/2023   ID:  Patricia Dominguez, DOB Oct 14, 1955, MRN [id number] The patient was identified using 2 identifiers.  PCP:  Mayo, Baxter Kail, PA-C   [facility] Providers Cardiologist:  Armanda Magic, MD     Evaluation Performed:  Follow-Up Visit  Chief Complaint:  Chest pain  History of Present Illness:    Patricia Dominguez is a 68 y.o. female with a hx of atypical chest pain tender to palpation over her left chest wall that initially started in 2018. CRF included remote tobacco use, family history of CAD and MI at an early age in her father and hypertension.  2-D echo 07/2016 showed normal LVEF 60-65% with no wall motion abnormality. No stress test recommended as her CP was felt to be musculoskeletal.   Statin was stopped and beta blocker was stopped because of bradycardia. She saw Herma Carson, PA back a few months later and was still having some mild CP and BP was elevated.  Her Losartan was increased to 50mg  BID and was referred to pain management.  ETT was recommended but she never followed through.    She was seen back for virtual visit in April 2020 with complaints of recurrent chest pain.  The pain was different from what she had before in some respects but and others is the same.  There was some sharp shooting pains but then she also felt like something was squeezing her heart inside.  She would notice her heart racing at times and beating irregular.  She thhought she may be having panic attacks because if she would sit down and rests and take Xanax her symptoms seem to improve.  Of concern though she was getting nauseated and sometimes vomited with the chest discomfort.  She denied any radiation of the pain or diaphoresis.  Her blood pressures had not been well controlled as well.  A Lexiscan myoview was ordered but patient never followed through.     She was seen back by me and was still having chest pain that was sporadic and she was very concerned something was  wrong with her heart.  She cancelled her nuclear stress test several times due to concern over getting COVID.  She wore a Ziopatch  but we never got the results.  Nuclear stress test 2022 was normal with no ischemia.  Stress PET CT 10/02/2022 showed no ischemia and normal LV function and myocardial blood flow reserve.  She is currently being listed at [facility] for kidney tx and trying to get on the list at [facility].  She is here today for followup and is doing well.  She had some CP a few months ago but her PCP felt it was anxiety and also had not had her fluid pill.  She has not had any further CP.  She denies any  SOB, DOE, PND, orthopnea,dizziness, palpitations or syncope. Occasionally she will have some LE edema. She is compliant with her meds and is tolerating meds with no SE.    Past Medical History:  Diagnosis Date   Arthritis    Back pain    Breast pain    Chronic kidney disease    Complication of anesthesia    woke up during colonoscopy   Depression    GERD (gastroesophageal reflux disease)    Hyperlipidemia    Hypertension    Neck pain    PONV (postoperative nausea and vomiting)    PVCs (premature ventricular contractions)    occasional  PVCs on event monitor 12/2020   Systemic lupus erythematosus (HCC)    Past Surgical History:  Procedure Laterality Date   ABDOMINAL HYSTERECTOMY     APPENDECTOMY     arm surgery Left    CERVICAL SPINE SURGERY     ROBOTIC ASSITED PARTIAL NEPHRECTOMY Left 03/11/2021   Procedure: XI ROBOTIC ASSITED PARTIAL NEPHRECTOMY;  Surgeon: Rene Paci, MD;  Location: [facility];  Service: Urology;  Laterality: Left;     Current Meds  Medication Sig   ALPRAZolam (XANAX) 1 MG tablet Take 0.5-1 mg by mouth daily.   amLODipine (NORVASC) 10 MG tablet Take 10 mg by mouth daily.   aspirin EC 81 MG tablet Take 81 mg by mouth daily.   atorvastatin (LIPITOR) 20 MG tablet Take 20 mg by mouth at bedtime.   bacitracin ointment Apply topically.    botulinum toxin Type A (BOTOX) 200 units injection Inject 155 units IM into multiple site in the face,neck and head once every 90 days   carvedilol (COREG) 6.25 MG tablet TAKE 1 TABLET(6.25 MG) BY MOUTH TWICE DAILY   Cyanocobalamin (VITAMIN B-12) 5000 MCG SUBL Place under the tongue.   docusate sodium (COLACE) 100 MG capsule Take 1 capsule (100 mg total) by mouth 2 (two) times daily.   doxazosin (CARDURA) 2 MG tablet Take 2 mg by mouth daily with supper.   famotidine (PEPCID) 40 MG tablet Take 40 mg by mouth daily.   ferrous sulfate 325 (65 FE) MG tablet Take 325 mg by mouth daily with breakfast.   fluticasone (FLONASE) 50 MCG/ACT nasal spray Place 2 sprays into both nostrils daily.   furosemide (LASIX) 40 MG tablet Take 40 mg by mouth every Monday, Wednesday, and Friday.   gabapentin (NEURONTIN) 300 MG capsule Take 300 mg by mouth 3 (three) times daily.   hydrALAZINE (APRESOLINE) 100 MG tablet Take 100 mg by mouth 3 (three) times daily.   LINZESS 145 MCG CAPS capsule Take 145 mcg by mouth daily as needed for constipation.   losartan (COZAAR) 50 MG tablet Take 50 mg by mouth daily.   Menthol-Methyl Salicylate (BEN GAY GREASELESS) 10-15 % greaseless cream Apply 1 application topically 3 (three) times daily as needed for pain.   ondansetron (ZOFRAN-ODT) 4 MG disintegrating tablet Take 1 tablet (4 mg total) by mouth every 8 (eight) hours as needed.   oxyCODONE-acetaminophen (PERCOCET) 10-325 MG tablet Take 1 tablet by mouth every 6 (six) hours as needed.   sertraline (ZOLOFT) 25 MG tablet Take 25 mg by mouth daily.   sodium bicarbonate 650 MG tablet Take 650 mg by mouth 2 (two) times daily.   Ubrogepant (UBRELVY) 100 MG TABS Take 1 tablet (100 mg total) by mouth as needed. May repeat after 2 hours.  Maximum 2 tablets in 24 hours     Allergies:   Ivp dye [iodinated contrast media], Iodine, Penicillins, and Sulfa antibiotics   Social History   Tobacco Use   Smoking status: Some Days    Types:  Cigarettes    Passive exposure: Never   Smokeless tobacco: Never   Tobacco comments:    1 cig some days  Vaping Use   Vaping status: Never Used  Substance Use Topics   Alcohol use: No   Drug use: No     Family Hx: The patient's family history includes Heart attack (age of onset: 70) in her sister; Heart attack (age of onset: 86) in her father; Kidney failure in her mother.  ROS:   Please  see the history of present illness.     All other systems reviewed and are negative.   Prior CV studies:   The following studies were reviewed today:  none  Labs/Other Tests and Data Reviewed:      Recent Labs: No results found for requested labs within last 365 days.   Recent Lipid Panel No results found for: "CHOL", "TRIG", "HDL", "CHOLHDL", "LDLCALC", "LDLDIRECT"  Wt Readings from Last 3 Encounters:  09/08/23 143 lb (64.9 kg)  05/03/23 143 lb (64.9 kg)  01/25/23 138 lb (62.6 kg)     Risk Assessment/Calculations:          Objective:    Vital Signs:  BP 110/60   Pulse 64   Ht 5' 7.5" (1.715 m)   Wt 143 lb (64.9 kg)   SpO2 94%   BMI 22.07 kg/m   GEN: Well nourished, well developed in no acute distress HEENT: Normal NECK: No JVD; right carotid bruit LYMPHATICS: No lymphadenopathy CARDIAC:RRR, no , rubs, gallops 2/6 SM at RUSB RESPIRATORY:  Clear to auscultation without rales, wheezing or rhonchi  ABDOMEN: Soft, non-tender, non-distended MUSCULOSKELETAL:  No edema; No deformity  SKIN: Warm and dry NEUROLOGIC:  Alert and oriented x 3 PSYCHIATRIC:  Normal affect  ASSESSMENT & PLAN:    1.  Chest pain/Coronary artery calcifications.  -this has been atypical  -nuclear stress test 10/2020 showed no ischemia -Stress PET CT 10/02/2022 showed no ischemia, normal myocardial blood flow reserve and normal LV function.  There were also coronary artery calcifications.  -she had some CP a few months ago in setting of anxiety and missing diuretics but no further episodes -will  repeat Stress PET CT -Informed Consent   Shared Decision Making/Informed Consent The risks [chest pain, shortness of breath, cardiac arrhythmias, dizziness, blood pressure fluctuations, myocardial infarction, stroke/transient ischemic attack, nausea, vomiting, allergic reaction, radiation exposure, metallic taste sensation and life-threatening complications (estimated to be 1 in 10,000)], benefits (risk stratification, diagnosing coronary artery disease, treatment guidance) and alternatives of a cardiac PET stress test were discussed in detail with Patricia Dominguez and she agrees to proceed.     2.  HTN -BP controlled on exam today -Continue prescription drug managed with amlodipine 10 mg daily, carvedilol 6.25 mg twice daily, doxazosin 2 mg nightly, hydralazine 100 mg 3 times daily, losartan 50 mg daily with as needed refills  -I have personally reviewed and interpreted outside labs performed by patient's PCP which showed SCr 2.44 and K+ 4.9 on 07/14/2023   3.  Palpitations -she did a heart monitor and turned it back but there is no report that can be found  -30-day event monitor 12/31/2020 showed a rare PVC  4.  CKD stage 4 -followed by nephrology -diuretics per renal  5.  Heart murmur -check 2D echo  6.  Carotid bruit -check carotid dopplers  Followup with me in 1 year     Time:   Today, I have spent 15 minutes with the patient with telehealth technology discussing the above problems.     Medication Adjustments/Labs and Tests Ordered: Current medicines are reviewed at length with the patient today.  Concerns regarding medicines are outlined above.   Tests Ordered: Orders Placed This Encounter  Procedures   EKG 12-Lead    Medication Changes: No orders of the defined types were placed in this encounter.   Follow Up:  followup with me in 1 year   Signed, Armanda Magic, MD  09/08/2023 2:32 PM    [facility]

## 2023-09-08 ENCOUNTER — Telehealth: Payer: Self-pay | Admitting: Pharmacist

## 2023-09-08 ENCOUNTER — Ambulatory Visit: Payer: 59 | Attending: Cardiology | Admitting: Cardiology

## 2023-09-08 ENCOUNTER — Encounter: Payer: Self-pay | Admitting: Cardiology

## 2023-09-08 VITALS — BP 110/60 | HR 64 | Ht 67.5 in | Wt 143.0 lb

## 2023-09-08 DIAGNOSIS — R079 Chest pain, unspecified: Secondary | ICD-10-CM | POA: Diagnosis not present

## 2023-09-08 DIAGNOSIS — I10 Essential (primary) hypertension: Secondary | ICD-10-CM | POA: Diagnosis not present

## 2023-09-08 DIAGNOSIS — R0989 Other specified symptoms and signs involving the circulatory and respiratory systems: Secondary | ICD-10-CM

## 2023-09-08 DIAGNOSIS — R072 Precordial pain: Secondary | ICD-10-CM

## 2023-09-08 DIAGNOSIS — R002 Palpitations: Secondary | ICD-10-CM

## 2023-09-08 DIAGNOSIS — N184 Chronic kidney disease, stage 4 (severe): Secondary | ICD-10-CM

## 2023-09-08 DIAGNOSIS — R011 Cardiac murmur, unspecified: Secondary | ICD-10-CM

## 2023-09-08 NOTE — Addendum Note (Signed)
 Addended by: Frutoso Schatz on: 09/08/2023 02:40 PM   Modules accepted: Orders

## 2023-09-08 NOTE — Telephone Encounter (Signed)
 Appeal has been submitted for Ubrelvy. Will advise when response is received, please be advised that most companies may take 30 days to make a decision. Appeal letter and supporting documentation have been faxed to [phone] on 09/08/2023 @1 :42 pm.  Thank you, Dellie Burns, PharmD Clinical Pharmacist  [facility]  Direct Dial: [phone]

## 2023-09-08 NOTE — Patient Instructions (Signed)
 Medication Instructions:  Your physician recommends that you continue on your current medications as directed. Please refer to the Current Medication list given to you today.  *If you need a refill on your cardiac medications before your next appointment, please call your pharmacy*  Testing/Procedures: Echocardiogram Your physician has requested that you have an echocardiogram. Echocardiography is a painless test that uses sound waves to create images of your heart. It provides your doctor with information about the size and shape of your heart and how well your heart's chambers and valves are working. This procedure takes approximately one hour. There are no restrictions for this procedure. Please do NOT wear cologne, perfume, aftershave, or lotions (deodorant is allowed). Please arrive 15 minutes prior to your appointment time.  Carotid Ultrasound Your physician has requested that you have a carotid duplex. This test is an ultrasound of the carotid arteries in your neck. It looks at blood flow through these arteries that supply the brain with blood. Allow one hour for this exam. There are no restrictions or special instructions.  Cardiac PET Stress Test - please see instructions below  Follow-Up: At [facility], you and your health needs are our priority.  As part of our continuing mission to provide you with exceptional heart care, we have created designated Provider Care Teams.  These Care Teams include your primary Cardiologist (physician) and Advanced Practice Providers (APPs -  Physician Assistants and Nurse Practitioners) who all work together to provide you with the care you need, when you need it.  Your next appointment:   1 year  Provider:   Armanda Magic, MD     Other Instructions    Please report to Radiology at the [facility] Main Entrance 30 minutes early for your test.  [address] [place], [place] [address]                         OR    Please report to Radiology at [facility] Main Entrance, medical mall, 30 mins prior to your test.  [address]  [place], [place]  How to Prepare for Your Cardiac PET/CT Stress Test:  Nothing to eat or drink, except water, 3 hours prior to arrival time.  NO caffeine/decaffeinated products, or chocolate 12 hours prior to arrival. (Please note decaffeinated beverages (teas/coffees) still contain caffeine).  If you have caffeine within 12 hours prior, the test will need to be rescheduled.  Medication instructions: Do not take erectile dysfunction medications for 72 hours prior to test (sildenafil, tadalafil) Do not take nitrates (isosorbide mononitrate, Ranexa) the day before or day of test Do not take tamsulosin the day before or morning of test Hold theophylline containing medications for 12 hours. Hold Dipyridamole 48 hours prior to the test.  Diabetic Preparation: If able to eat breakfast prior to 3 hour fasting, you may take all medications, including your insulin. Do not worry if you miss your breakfast dose of insulin - start at your next meal. If you do not eat prior to 3 hour fast-Hold all diabetes (oral and insulin) medications. Patients who wear a continuous glucose monitor MUST remove the device prior to scanning.  You may take your remaining medications with water.  NO perfume, cologne or lotion on chest or abdomen area. FEMALES - Please avoid wearing dresses to this appointment.  Total time is 1 to 2 hours; you may want to bring reading material for the waiting time.  IF YOU THINK YOU MAY BE PREGNANT, OR ARE NURSING PLEASE INFORM THE TECHNOLOGIST.  In preparation for your appointment, medication and supplies will be purchased.  Appointment availability is limited, so if you need to cancel or reschedule, please call the Radiology Department Scheduler at [phone] 24 hours in advance to avoid a cancellation fee of $100.00  What to Expect  When you Arrive:  Once you arrive and check in for your appointment, you will be taken to a preparation room within the Radiology Department.  A technologist or Nurse will obtain your medical history, verify that you are correctly prepped for the exam, and explain the procedure.  Afterwards, an IV will be started in your arm and electrodes will be placed on your skin for EKG monitoring during the stress portion of the exam. Then you will be escorted to the PET/CT scanner.  There, staff will get you positioned on the scanner and obtain a blood pressure and EKG.  During the exam, you will continue to be connected to the EKG and blood pressure machines.  A small, safe amount of a radioactive tracer will be injected in your IV to obtain a series of pictures of your heart along with an injection of a stress agent.    After your Exam:  It is recommended that you eat a meal and drink a caffeinated beverage to counter act any effects of the stress agent.  Drink plenty of fluids for the remainder of the day and urinate frequently for the first couple of hours after the exam.  Your doctor will inform you of your test results within 7-10 business days.  For more information and frequently asked questions, please visit our website: [email or web address]  For questions about your test or how to prepare for your test, please call: Cardiac Imaging Nurse Navigators Office: [phone]

## 2023-09-09 ENCOUNTER — Ambulatory Visit
Admission: RE | Admit: 2023-09-09 | Discharge: 2023-09-09 | Disposition: A | Discharge status: Home / Self Care | Source: Ambulatory Visit | Attending: Nephrology | Admitting: Nephrology

## 2023-09-09 DIAGNOSIS — R9389 Abnormal findings on diagnostic imaging of other specified body structures: Secondary | ICD-10-CM

## 2023-09-12 NOTE — Telephone Encounter (Signed)
 Additional information has been requested from the patient's insurance in order to proceed with the appeal request. Requested information has been sent, or form has been filled out and faxed back to [phone]

## 2023-09-16 ENCOUNTER — Encounter (HOSPITAL_COMMUNITY): Payer: Self-pay

## 2023-09-16 ENCOUNTER — Ambulatory Visit (HOSPITAL_COMMUNITY)

## 2023-09-21 ENCOUNTER — Other Ambulatory Visit (HOSPITAL_COMMUNITY): Payer: Self-pay

## 2023-09-21 NOTE — Telephone Encounter (Signed)
 Per insurance, appeal has been approved 09/21/2023-04/092026  Based on test claim, patient has already picked up a fill:

## 2023-09-26 ENCOUNTER — Ambulatory Visit (HOSPITAL_COMMUNITY)
Admission: RE | Admit: 2023-09-26 | Discharge: 2023-09-26 | Disposition: A | Discharge status: Home / Self Care | Source: Ambulatory Visit | Attending: Cardiology | Admitting: Cardiology

## 2023-09-26 DIAGNOSIS — R079 Chest pain, unspecified: Secondary | ICD-10-CM | POA: Insufficient documentation

## 2023-09-26 DIAGNOSIS — N184 Chronic kidney disease, stage 4 (severe): Secondary | ICD-10-CM | POA: Diagnosis present

## 2023-09-26 DIAGNOSIS — R011 Cardiac murmur, unspecified: Secondary | ICD-10-CM | POA: Diagnosis present

## 2023-09-26 DIAGNOSIS — R072 Precordial pain: Secondary | ICD-10-CM | POA: Diagnosis present

## 2023-09-26 DIAGNOSIS — I10 Essential (primary) hypertension: Secondary | ICD-10-CM | POA: Diagnosis present

## 2023-09-26 DIAGNOSIS — R0989 Other specified symptoms and signs involving the circulatory and respiratory systems: Secondary | ICD-10-CM | POA: Diagnosis present

## 2023-09-26 DIAGNOSIS — R002 Palpitations: Secondary | ICD-10-CM | POA: Insufficient documentation

## 2023-09-27 ENCOUNTER — Encounter: Payer: Self-pay | Admitting: Cardiology

## 2023-09-27 DIAGNOSIS — I6529 Occlusion and stenosis of unspecified carotid artery: Secondary | ICD-10-CM | POA: Insufficient documentation

## 2023-09-30 ENCOUNTER — Telehealth: Payer: Self-pay

## 2023-09-30 DIAGNOSIS — R0989 Other specified symptoms and signs involving the circulatory and respiratory systems: Secondary | ICD-10-CM

## 2023-09-30 DIAGNOSIS — I6523 Occlusion and stenosis of bilateral carotid arteries: Secondary | ICD-10-CM

## 2023-09-30 NOTE — Telephone Encounter (Signed)
 The patient has been notified of the result and verbalized understanding.  All questions (if any) were answered. Marnell Sinks [place], RN 09/30/2023 9:21 AM   Patient stated she does not have any left arm weakness or pain. Will place order for repeat doppler in 1 year.

## 2023-09-30 NOTE — Telephone Encounter (Signed)
-----   Message from Gaylyn Keas sent at 09/27/2023  5:52 PM EDT ----- 40-59% right ICA stenosis and 1-39% left ICA stenosis.  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  Please find out if she has had any left arm weakness or pain

## 2023-11-01 ENCOUNTER — Ambulatory Visit: Payer: 59 | Admitting: Nutrition

## 2023-11-08 ENCOUNTER — Other Ambulatory Visit (HOSPITAL_COMMUNITY): Payer: Self-pay

## 2023-11-10 ENCOUNTER — Telehealth: Payer: Self-pay | Admitting: Neurology

## 2023-11-10 NOTE — Telephone Encounter (Signed)
 Patient has 11 refills at the pharmacy. Approval good until 09/20/24.

## 2023-11-10 NOTE — Telephone Encounter (Signed)
 Patient called and wants to get a refill on the Ubrelvy  she uses the [facility]

## 2023-11-11 ENCOUNTER — Other Ambulatory Visit: Payer: Self-pay

## 2023-11-11 ENCOUNTER — Other Ambulatory Visit (HOSPITAL_COMMUNITY): Payer: Self-pay

## 2023-11-11 NOTE — Progress Notes (Signed)
 Specialty Pharmacy Refill Coordination Note  Spoke with Patricia Dominguez (Self).   Patricia Dominguez is a 68 y.o. female contacted today regarding refills of specialty medication(s) OnabotulinumtoxinA  (BOTOX )   Patient requested Courier to Provider Office   Delivery date: 11/24/23   Verified address: [facility]   [place] [address]   Medication will be filled on 11/23/23.

## 2023-11-11 NOTE — Progress Notes (Signed)
 Clinical Intervention Note  Clinical Intervention Notes: Patient started taking Lokelma. No DDIs identified with Botox .   Clinical Intervention Outcomes: Prevention of an adverse drug event   Advertising account planner

## 2023-11-13 LAB — LAB REPORT - SCANNED: EGFR: 20

## 2023-11-16 NOTE — Progress Notes (Unsigned)
 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 Migraine rescue:  Ubrelvy .  Will try to submit letter of necessity to the insurance stating need for 16 tablets a month due to frequency.  Zofran  for nausea Advised to follow up with dentist about mouth guard.  Limit use of pain relievers to no more than 2 days out of week to prevent risk of rebound or medication-overuse headache. 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  History supplemented by her accompanying friend/caregiver.  UPDATE: Status post two treatments of Botox  Does well until the last 2 weeks prior to next treatment.  She is struggling because while Ubrelvy  is effective, she does not have enough to treat her migraines the last 2 weeks because her insurance will only approve 11 tablets a month instead of 16.  Intensity:  moderate Duration:  15 minutes with Ubrelvy  Frequency:  2 days a week For the last 2 weeks before next Botox , will have 4 a week and lasting a day (as she runs out of Ubrelvy ). Current NSAIDS/analgesics:  Goody powder, oxycodone , ASA 81mg  daily Current triptans:  none Current ergotamine:  none Current anti-emetic:  Zofran  ODT 4mg  Current muscle relaxants:  none Current Antihypertensive medications:  carvedilol , losartan , hydralazine , amlodipine ,  furosemide  Current Antidepressant medications:  none Current Anticonvulsant medications:  gabapentin  300mg  TID Current anti-CGRP:  Ubrelvy  100mg , Nurtec PRN Current Vitamins/Herbal/Supplements:  sodium bicarb (for metabolic acidosis), magnesium oxide 420mg  daily, ferrous sulfate, D Current Antihistamines/Decongestants:  none Other therapy:  Botox      Caffeine:  Coffee once or twice a week Alcohol:  no Smoker:  cut down on smoking.  Now just one puff once a week. Diet:  Drinks six 16 oz bottles of water daily.  Doesn't skip meals; has a dietician now. Exercise:  walk, stretching Depression/Anxiety:  yes Sleep hygiene:  poor  HISTORY:  Onset:  She started having headaches around 68 years old.  They became worse in December 2023 when she accidentally walked into a sheet of glass.  She did pass out.  Went to the ED in [place] where CT head revealed no acute findings.   Location:  Right frontal region which may spread to top of head or down the right side of her jaw, where she has TMJ dysfunction Quality:  feels like somebody is drilling into her head.   Intensity:  severe.   Aura:  absent Prodrome:  absent Associated symptoms:  Nausea, vomiting, photophobia, phonophobia, blurred vision, feels off balance, bilateral neck pain.  She denies associated unilateral numbness or weakness. Duration:  2 days. Frequency:  almost daily with a migraine headache.   Triggers:  elevated blood pressure (if she misses her medication) Relieving factors:  massaging head and peppermint oil and cold pack, coffee Activity:  cannot move or function Takes Nurtec as needed but no longer effective.  Past NSAIDS/analgesics:  cannot take NSAIDs due to kidney disease, Tylenol  Past abortive triptans:  none Past abortive ergotamine:  none Past muscle relaxants:  Flexeril  Past anti-emetic:  promethazine  Past antihypertensive medications:  metoprolol , labetolol Past antidepressant medications:  Wellbutrin  Past  anticonvulsant medications:  none Past anti-CGRP:  none.  Would not use Aimovig due to poor to control HTN (on several medications) Other past therapies:  none      She had a positive ANA.  Worked up by rheumatology who ruled out an autoimmune disease such as lupus. Family history of headache:  no  PAST MEDICAL HISTORY: Past Medical History:  Diagnosis Date   Arthritis    Back pain    Breast pain    Carotid artery stenosis    40-59% right ICA stenosis and 1-39% left ICA stenosis   Chronic kidney disease    Complication of anesthesia    woke up during colonoscopy   Depression    GERD (gastroesophageal reflux disease)    Hyperlipidemia    Hypertension    Neck pain    PONV (postoperative nausea and vomiting)    PVCs (premature ventricular contractions)    occasional PVCs on event monitor 12/2020   Systemic lupus erythematosus (HCC)     MEDICATIONS: Current Outpatient Medications on File Prior to Visit  Medication Sig Dispense Refill   ALPRAZolam  (XANAX ) 1 MG tablet Take 0.5-1 mg by mouth daily.     amLODipine  (NORVASC ) 10 MG tablet Take 10 mg by mouth daily.     aspirin  EC 81 MG tablet Take 81 mg by mouth daily.     atorvastatin  (LIPITOR) 20 MG tablet Take 20 mg by mouth at bedtime.     bacitracin ointment Apply topically.     botulinum toxin Type A  (BOTOX ) 200 units injection Inject 155 units IM into multiple site in the face,neck and head once every 90 days 1 each 4   carvedilol  (COREG ) 6.25 MG tablet TAKE 1 TABLET(6.25 MG) BY MOUTH TWICE DAILY 60 tablet 0   Cyanocobalamin (VITAMIN B-12) 5000 MCG SUBL Place under the tongue.     docusate sodium  (COLACE) 100 MG capsule Take 1 capsule (100 mg total) by mouth 2 (two) times daily.     doxazosin  (CARDURA ) 2 MG tablet Take 2 mg by mouth daily with supper.     famotidine (PEPCID) 40 MG tablet Take 40 mg by mouth daily.     ferrous sulfate 325 (65 FE) MG tablet Take 325 mg by mouth daily with breakfast.     fluticasone (FLONASE)  50 MCG/ACT nasal spray Place 2 sprays into both nostrils daily.     furosemide  (LASIX ) 40 MG tablet Take 40 mg by mouth every Monday, Wednesday, and Friday.     gabapentin  (NEURONTIN ) 300 MG capsule Take 300 mg by mouth 3 (three) times daily.     hydrALAZINE  (APRESOLINE ) 100 MG tablet Take 100 mg by mouth 3 (three) times daily.     LINZESS  145 MCG CAPS capsule Take 145 mcg by mouth daily as needed for constipation.     losartan  (COZAAR ) 50 MG tablet Take 50 mg by mouth daily.     Menthol-Methyl Salicylate (BEN GAY GREASELESS) 10-15 % greaseless cream Apply 1 application topically 3 (three) times daily as needed for pain.     ondansetron  (ZOFRAN -ODT) 4 MG disintegrating tablet Take 1 tablet (4 mg total) by mouth every 8 (eight) hours as needed. 20 tablet 5   oxyCODONE -acetaminophen  (PERCOCET) 10-325 MG tablet Take 1 tablet  by mouth every 6 (six) hours as needed.     sertraline (ZOLOFT) 25 MG tablet Take 25 mg by mouth daily.     sodium bicarbonate 650 MG tablet Take 650 mg by mouth 2 (two) times daily.     Ubrogepant  (UBRELVY ) 100 MG TABS Take 1 tablet (100 mg total) by mouth as needed. May repeat after 2 hours.  Maximum 2 tablets in 24 hours 16 tablet 11   No current facility-administered medications on file prior to visit.    ALLERGIES: Allergies  Allergen Reactions   Ivp Dye [Iodinated Contrast Media] Nausea And Vomiting   Iodine Other (See Comments)    Pt unsure of reaction    Penicillins Other (See Comments)    Passed out after penicillin injection at 68 years old. Has tolerated as an adult.   Sulfa Antibiotics Other (See Comments)    Unknown allergic reaction    FAMILY HISTORY: Family History  Problem Relation Age of Onset   Kidney failure Mother    Heart attack Father 26   Heart attack Sister 35      Objective:  Blood pressure 137/62, pulse 62, height 5\' 7"  (1.702 m), weight 144 lb (65.3 kg), SpO2 100%. General: No acute distress.  Patient appears well-groomed.       Janne Members, DO  CC: Marigene Shoulder, PA-C

## 2023-11-17 ENCOUNTER — Ambulatory Visit (INDEPENDENT_AMBULATORY_CARE_PROVIDER_SITE_OTHER): Payer: 59 | Admitting: Neurology

## 2023-11-17 ENCOUNTER — Encounter: Payer: Self-pay | Admitting: Neurology

## 2023-11-17 ENCOUNTER — Telehealth: Payer: Self-pay

## 2023-11-17 VITALS — BP 137/62 | HR 62 | Ht 67.0 in | Wt 144.0 lb

## 2023-11-17 DIAGNOSIS — G43009 Migraine without aura, not intractable, without status migrainosus: Secondary | ICD-10-CM | POA: Diagnosis not present

## 2023-11-17 NOTE — Telephone Encounter (Signed)
 PA needed for botox.

## 2023-11-17 NOTE — Patient Instructions (Signed)
 Continue Botox  Take one Nurtec daily starting today and until next Botox  Ubrelvy  as needed

## 2023-11-21 ENCOUNTER — Telehealth: Payer: Self-pay | Admitting: Pharmacy Technician

## 2023-11-21 ENCOUNTER — Other Ambulatory Visit (HOSPITAL_COMMUNITY): Payer: Self-pay

## 2023-11-21 NOTE — Telephone Encounter (Signed)
 Pharmacy Patient Advocate Encounter  Pharmacy Benefit PA has been submitted for Botox - [id number] via CoverMyMeds.  INSURANCE: [employer] KEY/EOC/FAXDillon Frames Procedure code 16109  Does Not require a PA Status is Pending

## 2023-11-21 NOTE — Telephone Encounter (Signed)
 PA has been submitted, and telephone encounter has been created. Please see telephone encounter dated 6.9.25.

## 2023-11-22 ENCOUNTER — Other Ambulatory Visit: Payer: Self-pay | Admitting: Neurology

## 2023-11-22 ENCOUNTER — Ambulatory Visit: Admitting: Nutrition

## 2023-11-28 ENCOUNTER — Encounter (HOSPITAL_COMMUNITY): Payer: Self-pay

## 2023-11-30 ENCOUNTER — Ambulatory Visit: Payer: Self-pay | Admitting: Cardiology

## 2023-11-30 ENCOUNTER — Encounter (HOSPITAL_COMMUNITY)
Admission: RE | Admit: 2023-11-30 | Discharge: 2023-11-30 | Disposition: A | Discharge status: Home / Self Care | Source: Ambulatory Visit | Attending: Cardiology | Admitting: Cardiology

## 2023-11-30 DIAGNOSIS — R072 Precordial pain: Secondary | ICD-10-CM | POA: Diagnosis present

## 2023-11-30 LAB — NM PET CT CARDIAC PERFUSION MULTI W/ABSOLUTE BLOODFLOW
MBFR: 1.52
Nuc Rest EF: 64 %
Nuc Stress EF: 68 %
Rest MBF: 1.32 ml/g/min
Rest Nuclear Isotope Dose: 16.9 mCi
ST Depression (mm): 0 mm
Stress MBF: 2 ml/g/min
Stress Nuclear Isotope Dose: 17 mCi
TID: 0.9

## 2023-11-30 LAB — GLUCOSE, CAPILLARY: Glucose-Capillary: 116 mg/dL — ABNORMAL HIGH (ref 70–99)

## 2023-11-30 MED ORDER — REGADENOSON 0.4 MG/5ML IV SOLN
INTRAVENOUS | Status: AC
  Filled 2023-11-30: qty 5

## 2023-11-30 MED ORDER — REGADENOSON 0.4 MG/5ML IV SOLN
0.4000 mg | Freq: Once | INTRAVENOUS | Status: AC
  Administered 2023-11-30: 0.4 mg via INTRAVENOUS

## 2023-11-30 MED ORDER — RUBIDIUM RB82 GENERATOR (RUBYFILL)
25.0000 | PACK | Freq: Once | INTRAVENOUS | Status: AC
Start: 2023-11-30 — End: 2023-11-30
  Administered 2023-11-30: 16.96 via INTRAVENOUS

## 2023-11-30 MED ORDER — RUBIDIUM RB82 GENERATOR (RUBYFILL)
25.0000 | PACK | Freq: Once | INTRAVENOUS | Status: AC
  Administered 2023-11-30: 16.86 via INTRAVENOUS

## 2023-12-02 ENCOUNTER — Ambulatory Visit (INDEPENDENT_AMBULATORY_CARE_PROVIDER_SITE_OTHER): Payer: 59 | Admitting: Neurology

## 2023-12-02 DIAGNOSIS — G43009 Migraine without aura, not intractable, without status migrainosus: Secondary | ICD-10-CM

## 2023-12-02 MED ORDER — ONABOTULINUMTOXINA 100 UNITS IJ SOLR
200.0000 [IU] | Freq: Once | INTRAMUSCULAR | Status: AC
  Administered 2023-12-02: 155 [IU] via INTRAMUSCULAR

## 2023-12-02 NOTE — Progress Notes (Signed)

## 2023-12-05 ENCOUNTER — Ambulatory Visit: Payer: 59 | Admitting: Neurology

## 2024-01-06 ENCOUNTER — Other Ambulatory Visit (HOSPITAL_COMMUNITY): Payer: Self-pay

## 2024-01-06 NOTE — Telephone Encounter (Signed)
 Pharmacy Patient Advocate Encounter  Received notification from [employer] that Prior Authorization for BOTOX  200 has been APPROVED from 6.9.25 to 9.9.25. Ran test claim, Copay is $0. This test claim was processed through [facility]- copay amounts may vary at other pharmacies due to pharmacy/plan contracts, or as the patient moves through the different stages of their insurance plan.   PA #/Case ID/Reference #: [id number]

## 2024-02-01 ENCOUNTER — Telehealth: Payer: Self-pay

## 2024-02-01 NOTE — Telephone Encounter (Signed)
 PA botox  expire 02/21/24 appt 9/19

## 2024-02-16 ENCOUNTER — Other Ambulatory Visit (HOSPITAL_COMMUNITY): Payer: Self-pay

## 2024-02-16 ENCOUNTER — Telehealth: Payer: Self-pay

## 2024-02-16 NOTE — Telephone Encounter (Signed)
 Pharmacy Patient Advocate Encounter   Received notification from Pt Calls Messages that prior authorization for Botox  200UNIT solution is required/requested.   Insurance verification completed.   The patient is insured through [facility].   Per test claim: PA required; PA submitted to above mentioned insurance via Latent Key/confirmation #/EOC [id number] Status is pending   *Pharmacy Benefits

## 2024-02-16 NOTE — Telephone Encounter (Signed)
 Pharmacy Patient Advocate Encounter  Received notification from [facility] that Prior Authorization for Botox  200UNIT solution has been APPROVED from 02-16-2024 to 05-17-2024   PA #/Case ID/Reference #: [id number]   *Pharmacy Benefits

## 2024-02-16 NOTE — Telephone Encounter (Signed)
 Pharmacy Benefits Submitted and Approved

## 2024-02-20 ENCOUNTER — Other Ambulatory Visit: Payer: Self-pay

## 2024-02-20 NOTE — Progress Notes (Signed)
 Specialty Pharmacy Refill Coordination Note  Patricia Dominguez is a 68 y.o. female assessed today regarding refills of clinic administered specialty medication(s) OnabotulinumtoxinA  (BOTOX )   Clinic requested Courier to Provider Office   Delivery date: 02/23/24   Injection date: 03/02/24  Verified address: [facility]   [place] [address]   Medication will be filled on 02/22/24.

## 2024-02-21 ENCOUNTER — Other Ambulatory Visit: Payer: Self-pay

## 2024-03-02 ENCOUNTER — Ambulatory Visit: Admitting: Neurology

## 2024-03-02 DIAGNOSIS — G43719 Chronic migraine without aura, intractable, without status migrainosus: Secondary | ICD-10-CM | POA: Diagnosis not present

## 2024-03-02 MED ORDER — ONABOTULINUMTOXINA 100 UNITS IJ SOLR
200.0000 [IU] | Freq: Once | INTRAMUSCULAR | Status: AC
  Administered 2024-03-02: 155 [IU] via INTRAMUSCULAR

## 2024-03-02 NOTE — Progress Notes (Signed)
 Medication Samples have been provided to the patient.  Drug name: [id number]       Strength: 75 mg        Qty: 2  LOT: [id number]  Exp.Date: 11/28  Dosing instructions: as needed  The patient has been instructed regarding the correct time, dose, and frequency of taking this medication, including desired effects and most common side effects.   Patricia Dominguez 3:56 PM 03/02/2024

## 2024-03-06 ENCOUNTER — Other Ambulatory Visit (HOSPITAL_COMMUNITY): Payer: Self-pay

## 2024-03-15 ENCOUNTER — Other Ambulatory Visit (HOSPITAL_COMMUNITY): Payer: Self-pay | Admitting: Nephrology

## 2024-03-15 DIAGNOSIS — Z01810 Encounter for preprocedural cardiovascular examination: Secondary | ICD-10-CM

## 2024-03-15 DIAGNOSIS — I3481 Nonrheumatic mitral (valve) annulus calcification: Secondary | ICD-10-CM

## 2024-04-06 ENCOUNTER — Ambulatory Visit (HOSPITAL_COMMUNITY): Admission: RE | Admit: 2024-04-06 | Source: Ambulatory Visit

## 2024-04-06 ENCOUNTER — Encounter (HOSPITAL_COMMUNITY): Payer: Self-pay

## 2024-05-01 ENCOUNTER — Other Ambulatory Visit: Payer: Self-pay | Admitting: Neurology

## 2024-05-04 ENCOUNTER — Other Ambulatory Visit: Payer: Self-pay | Admitting: Neurology

## 2024-05-09 ENCOUNTER — Ambulatory Visit (HOSPITAL_COMMUNITY): Admission: RE | Admit: 2024-05-09 | Source: Ambulatory Visit | Attending: Registered Nurse | Admitting: Registered Nurse

## 2024-05-15 ENCOUNTER — Other Ambulatory Visit: Payer: Self-pay

## 2024-05-15 ENCOUNTER — Other Ambulatory Visit: Payer: Self-pay | Admitting: Neurology

## 2024-05-15 NOTE — Progress Notes (Signed)
 Specialty Pharmacy Refill Coordination Note  Patricia Dominguez is a 68 y.o. female assessed today regarding refills of clinic administered specialty medication(s) OnabotulinumtoxinA  (BOTOX )   Clinic requested Courier to Provider Office   Delivery date: 05/29/24   Verified address: [facility]   [place] [address]   Medication will be filled on: 05/28/24   Copay:$0.00 Appointment: 12.19.25

## 2024-05-22 ENCOUNTER — Telehealth: Payer: Self-pay

## 2024-05-22 NOTE — Telephone Encounter (Signed)
 Botox  set for delivery 05/29/24.   PA expired 05/17/24

## 2024-05-23 ENCOUNTER — Telehealth: Payer: Self-pay | Admitting: Pharmacy Technician

## 2024-05-23 ENCOUNTER — Other Ambulatory Visit (HOSPITAL_COMMUNITY): Payer: Self-pay

## 2024-05-23 NOTE — Telephone Encounter (Signed)
 Pharmacy Patient Advocate Encounter   Received notification from Pt Calls Messages that prior authorization for BOTOX  200 is required/requested.   Insurance verification completed.   The patient is insured through [facility].   Per test claim: PA required; PA submitted to above mentioned insurance via Latent Key/confirmation #/EOC [id number] Status is pending

## 2024-05-23 NOTE — Telephone Encounter (Signed)
 PA has been submitted, and telephone encounter has been created. Please see telephone encounter dated 12.10.25.

## 2024-05-28 ENCOUNTER — Other Ambulatory Visit: Payer: Self-pay

## 2024-05-28 ENCOUNTER — Other Ambulatory Visit: Payer: Self-pay | Admitting: Neurology

## 2024-05-28 DIAGNOSIS — G43719 Chronic migraine without aura, intractable, without status migrainosus: Secondary | ICD-10-CM

## 2024-05-29 ENCOUNTER — Other Ambulatory Visit (HOSPITAL_COMMUNITY): Payer: Self-pay

## 2024-05-29 ENCOUNTER — Other Ambulatory Visit: Payer: Self-pay

## 2024-05-29 MED ORDER — BOTOX 200 UNITS IJ SOLR
INTRAMUSCULAR | 4 refills | Status: AC
  Filled 2024-05-29: qty 1, 90d supply, fill #0

## 2024-05-31 ENCOUNTER — Other Ambulatory Visit (HOSPITAL_COMMUNITY): Payer: Self-pay

## 2024-05-31 NOTE — Telephone Encounter (Signed)
 Pharmacy Patient Advocate Encounter  Received notification from [employer] that Prior Authorization for BOTOX  200 has been APPROVED from 12.10.25 to 12.31.26. Unable to obtain price due to refill too soon rejection, last fill date 12.16.25 next available fill date2.22.26   PA #/Case ID/Reference #: [id number]

## 2024-06-01 ENCOUNTER — Ambulatory Visit: Admitting: Neurology

## 2024-06-11 NOTE — Progress Notes (Deleted)
 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 Migraine rescue:  Ubrelvy .  Will try to submit letter of necessity to the insurance stating need for 16 tablets a month due to frequency.  Zofran  for nausea Advised to follow up with dentist about mouth guard.  Limit use of pain relievers to no more than 2 days out of week to prevent risk of rebound or medication-overuse headache. 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  History supplemented by her accompanying friend/caregiver.  UPDATE: ***  Intensity:  moderate Duration:  15 minutes with Ubrelvy  Frequency:  2 days a week For the last 2 weeks before next Botox , will have 4 a week and lasting a day (as she runs out of Ubrelvy ). Current NSAIDS/analgesics:  Goody powder, oxycodone , ASA 81mg  daily Current triptans:  none Current ergotamine:  none Current anti-emetic:  Zofran  ODT 4mg  Current muscle relaxants:  none Current Antihypertensive medications:  carvedilol , losartan , hydralazine , amlodipine , furosemide  Current Antidepressant medications:  none Current Anticonvulsant medications:  gabapentin  300mg  TID Current anti-CGRP:  Ubrelvy  100mg , Nurtec PRN Current Vitamins/Herbal/Supplements:  sodium bicarb (for metabolic acidosis), magnesium oxide 420mg  daily, ferrous sulfate, D Current  Antihistamines/Decongestants:  none Other therapy:  Botox      Caffeine:  Coffee once or twice a week Alcohol:  no Smoker:  cut down on smoking.  Now just one puff once a week. Diet:  Drinks six 16 oz bottles of water daily.  Doesn't skip meals; has a dietician now. Exercise:  walk, stretching Depression/Anxiety:  yes Sleep hygiene:  poor  HISTORY:  Onset:  She started having headaches around 68 years old.  They became worse in December 2023 when she accidentally walked into a sheet of glass.  She did pass out.  Went to the ED in [place] where CT head revealed no acute findings.   Location:  Right frontal region which may spread to top of head or down the right side of her jaw, where she has TMJ dysfunction Quality:  feels like somebody is drilling into her head.   Intensity:  severe.   Aura:  absent Prodrome:  absent Associated symptoms:  Nausea, vomiting, photophobia, phonophobia, blurred vision, feels off balance, bilateral neck pain.  She denies associated unilateral numbness or weakness. Duration:  2 days. Frequency:  almost daily with a migraine headache.   Triggers:  elevated blood pressure (if she misses her medication) Relieving factors:  massaging head and peppermint oil and cold pack, coffee Activity:  cannot move or function Takes Nurtec as needed but no longer effective.     Past NSAIDS/analgesics:  cannot take NSAIDs due to kidney disease, Tylenol  Past abortive triptans:  none Past abortive ergotamine:  none Past muscle relaxants:  Flexeril  Past anti-emetic:  promethazine  Past antihypertensive medications:  metoprolol , labetolol Past antidepressant medications:  Wellbutrin  Past anticonvulsant medications:  none  Past anti-CGRP:  none.  Would not use Aimovig due to poor to control HTN (on several medications) Other past therapies:  none      She had a positive ANA.  Worked up by rheumatology who ruled out an autoimmune disease such as lupus. Family history of  headache:  no  PAST MEDICAL HISTORY: Past Medical History:  Diagnosis Date   Arthritis    Back pain    Breast pain    Carotid artery stenosis    40-59% right ICA stenosis and 1-39% left ICA stenosis   Chronic kidney disease    Complication of anesthesia    woke up during colonoscopy   Depression    GERD (gastroesophageal reflux disease)    Hyperlipidemia    Hypertension    Neck pain    PONV (postoperative nausea and vomiting)    PVCs (premature ventricular contractions)    occasional PVCs on event monitor 12/2020   Systemic lupus erythematosus (HCC)     MEDICATIONS: Current Outpatient Medications on File Prior to Visit  Medication Sig Dispense Refill   ALPRAZolam  (XANAX ) 1 MG tablet Take 0.5-1 mg by mouth daily.     amLODipine  (NORVASC ) 10 MG tablet Take 10 mg by mouth daily.     aspirin  EC 81 MG tablet Take 81 mg by mouth daily.     atorvastatin  (LIPITOR) 20 MG tablet Take 20 mg by mouth at bedtime.     bacitracin ointment Apply topically.     botulinum toxin Type A  (BOTOX ) 200 units injection Inject 155 units IM into multiple site in the face,neck and head once every 90 days 1 each 4   carvedilol  (COREG ) 6.25 MG tablet TAKE 1 TABLET(6.25 MG) BY MOUTH TWICE DAILY 60 tablet 0   Cyanocobalamin (VITAMIN B-12) 5000 MCG SUBL Place under the tongue.     docusate sodium  (COLACE) 100 MG capsule Take 1 capsule (100 mg total) by mouth 2 (two) times daily.     doxazosin  (CARDURA ) 2 MG tablet Take 2 mg by mouth daily with supper.     famotidine (PEPCID) 40 MG tablet Take 40 mg by mouth daily.     ferrous sulfate 325 (65 FE) MG tablet Take 325 mg by mouth daily with breakfast.     fluticasone (FLONASE) 50 MCG/ACT nasal spray Place 2 sprays into both nostrils daily.     furosemide  (LASIX ) 40 MG tablet Take 40 mg by mouth every Monday, Wednesday, and Friday.     gabapentin  (NEURONTIN ) 300 MG capsule Take 300 mg by mouth 3 (three) times daily.     hydrALAZINE  (APRESOLINE ) 100 MG tablet Take  100 mg by mouth 3 (three) times daily.     LINZESS  145 MCG CAPS capsule Take 145 mcg by mouth daily as needed for constipation.     losartan  (COZAAR ) 50 MG tablet Take 50 mg by mouth daily.     Menthol-Methyl Salicylate (BEN GAY GREASELESS) 10-15 % greaseless cream Apply 1 application topically 3 (three) times daily as needed for pain.     ondansetron  (ZOFRAN -ODT) 4 MG disintegrating tablet DISSOLVE 1 TABLET(4 MG) ON THE TONGUE EVERY 8 HOURS AS NEEDED 20 tablet 0   oxyCODONE -acetaminophen  (PERCOCET) 10-325 MG tablet Take 1 tablet by mouth every 6 (six) hours as needed.     sertraline (ZOLOFT) 25 MG tablet Take 25 mg by mouth daily.     sodium bicarbonate 650 MG tablet Take 650 mg by mouth 2 (two) times daily.     Ubrogepant  (UBRELVY ) 100  MG TABS TAKE 1 TABLET BY MOUTH AS NEEDED MAY REPEAT AFTER 2 HOURS MAX 2 IN 24 HOURS 11 tablet 0   No current facility-administered medications on file prior to visit.    ALLERGIES: Allergies  Allergen Reactions   Ivp Dye [Iodinated Contrast Media] Nausea And Vomiting   Iodine Other (See Comments)    Pt unsure of reaction    Penicillins Other (See Comments)    Passed out after penicillin injection at 68 years old. Has tolerated as an adult.   Sulfa Antibiotics Other (See Comments)    Unknown allergic reaction    FAMILY HISTORY: Family History  Problem Relation Age of Onset   Kidney failure Mother    Heart attack Father 9   Heart attack Sister 71      Objective:  *** General: No acute distress.  Patient appears well-groomed.   Head:  Normocephalic/atraumatic Neck:  Supple.  No paraspinal tenderness.  Full range of motion. Heart:  Regular rate and rhythm. Neuro:  Alert and oriented.  Speech fluent and not dysarthric.  Language intact.  CN II-XII intact.  Bulk and tone normal.  Muscle strength 5/5 throughout.  Sensation to light touch intact.  Deep tendon reflexes 2+ throughout, toes downgoing.  Gait normal.  Romberg negative.    Juliene Dunnings,  DO  CC: Dedra Tawni Cooley, PA-C       "

## 2024-06-12 ENCOUNTER — Ambulatory Visit: Admitting: Neurology

## 2024-06-13 ENCOUNTER — Other Ambulatory Visit: Payer: Self-pay | Admitting: Neurology

## 2024-07-11 ENCOUNTER — Other Ambulatory Visit: Payer: Self-pay | Admitting: Neurology

## 2024-07-13 ENCOUNTER — Ambulatory Visit: Admitting: Neurology

## 2024-09-07 ENCOUNTER — Ambulatory Visit: Payer: Self-pay | Admitting: Neurology

## 2024-10-19 ENCOUNTER — Ambulatory Visit: Payer: Self-pay | Admitting: Neurology
